# Patient Record
Sex: Female | Born: 1945
Health system: Southern US, Community
[De-identification: ages and names within clinical notes are randomized; demographics above are authoritative.]

## PROBLEM LIST (undated history)

## (undated) DIAGNOSIS — Z9189 Other specified personal risk factors, not elsewhere classified: Secondary | ICD-10-CM

## (undated) DIAGNOSIS — I10 Essential (primary) hypertension: Secondary | ICD-10-CM

## (undated) DIAGNOSIS — C50919 Malignant neoplasm of unspecified site of unspecified female breast: Secondary | ICD-10-CM

## (undated) DIAGNOSIS — E559 Vitamin D deficiency, unspecified: Secondary | ICD-10-CM

## (undated) DIAGNOSIS — C180 Malignant neoplasm of cecum: Secondary | ICD-10-CM

## (undated) DIAGNOSIS — R32 Unspecified urinary incontinence: Secondary | ICD-10-CM

## (undated) DIAGNOSIS — K219 Gastro-esophageal reflux disease without esophagitis: Secondary | ICD-10-CM

## (undated) DIAGNOSIS — M1712 Unilateral primary osteoarthritis, left knee: Secondary | ICD-10-CM

## (undated) DIAGNOSIS — D649 Anemia, unspecified: Secondary | ICD-10-CM

## (undated) HISTORY — DX: Malignant neoplasm of unspecified site of unspecified female breast: C50.919

## (undated) HISTORY — DX: Vitamin D deficiency, unspecified: E55.9

## (undated) HISTORY — DX: Other specified personal risk factors, not elsewhere classified: Z91.89

## (undated) HISTORY — DX: Unilateral primary osteoarthritis, left knee: M17.12

## (undated) HISTORY — PX: COLONOSCOPY: SHX174

## (undated) HISTORY — DX: Essential (primary) hypertension: I10

## (undated) HISTORY — PX: HEMORRHOID SURGERY: SHX153

## (undated) HISTORY — DX: Anemia, unspecified: D64.9

## (undated) HISTORY — DX: Malignant neoplasm of cecum: C18.0

## (undated) HISTORY — PX: POLYPECTOMY: SHX149

## (undated) HISTORY — DX: Gastro-esophageal reflux disease without esophagitis: K21.9

## (undated) HISTORY — DX: Unspecified urinary incontinence: R32

---

## 1974-06-22 HISTORY — PX: ABDOMINAL HYSTERECTOMY: SHX81

## 1979-02-21 HISTORY — PX: HEMORRHOID SURGERY: SHX153

## 2008-11-14 ENCOUNTER — Ambulatory Visit: Payer: Self-pay | Admitting: Internal Medicine

## 2008-11-14 DIAGNOSIS — Z9189 Other specified personal risk factors, not elsewhere classified: Secondary | ICD-10-CM | POA: Insufficient documentation

## 2008-11-14 DIAGNOSIS — I10 Essential (primary) hypertension: Secondary | ICD-10-CM | POA: Insufficient documentation

## 2008-11-14 DIAGNOSIS — K219 Gastro-esophageal reflux disease without esophagitis: Secondary | ICD-10-CM

## 2008-11-14 DIAGNOSIS — R32 Unspecified urinary incontinence: Secondary | ICD-10-CM

## 2008-11-14 DIAGNOSIS — Z87448 Personal history of other diseases of urinary system: Secondary | ICD-10-CM | POA: Insufficient documentation

## 2008-11-14 HISTORY — DX: Other specified personal risk factors, not elsewhere classified: Z91.89

## 2008-11-15 ENCOUNTER — Encounter (INDEPENDENT_AMBULATORY_CARE_PROVIDER_SITE_OTHER): Payer: Self-pay | Admitting: *Deleted

## 2008-11-15 LAB — CONVERTED CEMR LAB
Basophils Absolute: 0.1 10*3/uL (ref 0.0–0.1)
Eosinophils Absolute: 0.2 10*3/uL (ref 0.0–0.7)
HCT: 35.7 % — ABNORMAL LOW (ref 36.0–46.0)
Lymphs Abs: 1.4 10*3/uL (ref 0.7–4.0)
MCHC: 34 g/dL (ref 30.0–36.0)
MCV: 85.5 fL (ref 78.0–100.0)
Monocytes Absolute: 0.3 10*3/uL (ref 0.1–1.0)
Platelets: 393 10*3/uL (ref 150.0–400.0)
RDW: 14.1 % (ref 11.5–14.6)
TSH: 0.72 microintl units/mL (ref 0.35–5.50)

## 2008-11-29 ENCOUNTER — Ambulatory Visit: Payer: Self-pay | Admitting: Internal Medicine

## 2008-11-29 ENCOUNTER — Encounter (INDEPENDENT_AMBULATORY_CARE_PROVIDER_SITE_OTHER): Payer: Self-pay | Admitting: *Deleted

## 2008-11-29 DIAGNOSIS — E559 Vitamin D deficiency, unspecified: Secondary | ICD-10-CM | POA: Insufficient documentation

## 2008-11-29 LAB — CONVERTED CEMR LAB
AST: 18 units/L (ref 0–37)
Alkaline Phosphatase: 90 units/L (ref 39–117)
Basophils Absolute: 0.1 10*3/uL (ref 0.0–0.1)
Bilirubin Urine: NEGATIVE
CO2: 28 meq/L (ref 19–32)
Calcium: 8.8 mg/dL (ref 8.4–10.5)
Chloride: 107 meq/L (ref 96–112)
Creatinine, Ser: 0.7 mg/dL (ref 0.4–1.2)
Eosinophils Absolute: 0.2 10*3/uL (ref 0.0–0.7)
Glucose, Bld: 79 mg/dL (ref 70–99)
HDL: 63.2 mg/dL (ref >39.00)
LDL Cholesterol: 121 mg/dL — ABNORMAL HIGH (ref 0–99)
Leukocytes, UA: NEGATIVE
Lymphocytes Relative: 24.8 % (ref 12.0–46.0)
MCHC: 33.2 g/dL (ref 30.0–36.0)
Monocytes Relative: 5 % (ref 3.0–12.0)
Nitrite: NEGATIVE
Platelets: 402 10*3/uL — ABNORMAL HIGH (ref 150.0–400.0)
RDW: 14.6 % (ref 11.5–14.6)
Sodium: 141 meq/L (ref 135–145)
Total Bilirubin: 0.5 mg/dL (ref 0.3–1.2)
Total CHOL/HDL Ratio: 3
Triglycerides: 67 mg/dL (ref 0.0–149.0)
Urobilinogen, UA: 0.2 (ref 0.0–1.0)
pH: 6 (ref 5.0–8.0)

## 2008-12-12 ENCOUNTER — Ambulatory Visit: Payer: Self-pay | Admitting: Gastroenterology

## 2008-12-20 HISTORY — PX: COLON SURGERY: SHX602

## 2008-12-26 ENCOUNTER — Telehealth: Payer: Self-pay | Admitting: Gastroenterology

## 2008-12-26 ENCOUNTER — Encounter: Payer: Self-pay | Admitting: Gastroenterology

## 2008-12-26 ENCOUNTER — Ambulatory Visit: Payer: Self-pay | Admitting: Gastroenterology

## 2008-12-26 DIAGNOSIS — D490 Neoplasm of unspecified behavior of digestive system: Secondary | ICD-10-CM | POA: Insufficient documentation

## 2008-12-26 LAB — HM COLONOSCOPY

## 2008-12-27 ENCOUNTER — Ambulatory Visit: Payer: Self-pay | Admitting: Internal Medicine

## 2008-12-28 ENCOUNTER — Ambulatory Visit: Payer: Self-pay | Admitting: Cardiology

## 2009-01-04 ENCOUNTER — Encounter: Payer: Self-pay | Admitting: Gastroenterology

## 2009-01-13 ENCOUNTER — Encounter: Payer: Self-pay | Admitting: Internal Medicine

## 2009-01-14 ENCOUNTER — Telehealth: Payer: Self-pay | Admitting: Internal Medicine

## 2009-01-14 ENCOUNTER — Inpatient Hospital Stay (HOSPITAL_COMMUNITY): Admission: RE | Admit: 2009-01-14 | Discharge: 2009-01-18 | Payer: Self-pay | Admitting: General Surgery

## 2009-01-14 ENCOUNTER — Encounter (INDEPENDENT_AMBULATORY_CARE_PROVIDER_SITE_OTHER): Payer: Self-pay | Admitting: General Surgery

## 2009-02-07 ENCOUNTER — Encounter: Payer: Self-pay | Admitting: Internal Medicine

## 2009-02-08 ENCOUNTER — Ambulatory Visit: Payer: Self-pay | Admitting: Oncology

## 2009-02-14 ENCOUNTER — Encounter: Payer: Self-pay | Admitting: Internal Medicine

## 2009-02-14 ENCOUNTER — Telehealth: Payer: Self-pay | Admitting: Internal Medicine

## 2009-02-14 LAB — COMPREHENSIVE METABOLIC PANEL
Alkaline Phosphatase: 123 U/L — ABNORMAL HIGH (ref 39–117)
BUN: 5 mg/dL — ABNORMAL LOW (ref 6–23)
Creatinine, Ser: 0.55 mg/dL (ref 0.40–1.20)
Glucose, Bld: 86 mg/dL (ref 70–99)
Total Bilirubin: 0.5 mg/dL (ref 0.3–1.2)

## 2009-02-14 LAB — CBC WITH DIFFERENTIAL/PLATELET
Basophils Absolute: 0 10*3/uL (ref 0.0–0.1)
Eosinophils Absolute: 0.3 10*3/uL (ref 0.0–0.5)
HGB: 10.3 g/dL — ABNORMAL LOW (ref 11.6–15.9)
LYMPH%: 37.7 % (ref 14.0–49.7)
MCV: 85 fL (ref 79.5–101.0)
MONO%: 7.4 % (ref 0.0–14.0)
NEUT#: 2.3 10*3/uL (ref 1.5–6.5)
Platelets: 312 10*3/uL (ref 145–400)
RBC: 3.66 10*6/uL — ABNORMAL LOW (ref 3.70–5.45)

## 2009-02-14 LAB — CEA: CEA: 0.7 ng/mL (ref 0.0–5.0)

## 2009-03-21 ENCOUNTER — Encounter: Payer: Self-pay | Admitting: Internal Medicine

## 2009-03-28 ENCOUNTER — Ambulatory Visit: Payer: Self-pay | Admitting: Internal Medicine

## 2009-03-28 DIAGNOSIS — C180 Malignant neoplasm of cecum: Secondary | ICD-10-CM

## 2009-03-28 HISTORY — DX: Malignant neoplasm of cecum: C18.0

## 2009-03-28 LAB — CONVERTED CEMR LAB
Basophils Absolute: 0 10*3/uL (ref 0.0–0.1)
Calcium: 9.4 mg/dL (ref 8.4–10.5)
Eosinophils Absolute: 0.2 10*3/uL (ref 0.0–0.7)
GFR calc non Af Amer: 106.54 mL/min (ref >60)
Glucose, Bld: 85 mg/dL (ref 70–99)
HCT: 35.4 % — ABNORMAL LOW (ref 36.0–46.0)
Lymphs Abs: 1.4 10*3/uL (ref 0.7–4.0)
MCHC: 33.3 g/dL (ref 30.0–36.0)
MCV: 89.8 fL (ref 78.0–100.0)
Monocytes Absolute: 0.3 10*3/uL (ref 0.1–1.0)
Neutrophils Relative %: 64.8 % (ref 43.0–77.0)
Platelets: 265 10*3/uL (ref 150.0–400.0)
Potassium: 3.8 meq/L (ref 3.5–5.1)
RDW: 13.9 % (ref 11.5–14.6)
Sodium: 145 meq/L (ref 135–145)
WBC: 5.6 10*3/uL (ref 4.5–10.5)

## 2009-05-07 ENCOUNTER — Ambulatory Visit: Payer: Self-pay | Admitting: Oncology

## 2009-05-09 ENCOUNTER — Encounter: Payer: Self-pay | Admitting: Internal Medicine

## 2009-05-09 LAB — CBC WITH DIFFERENTIAL/PLATELET
Basophils Absolute: 0 10*3/uL (ref 0.0–0.1)
EOS%: 4 % (ref 0.0–7.0)
HCT: 35.1 % (ref 34.8–46.6)
HGB: 11.6 g/dL (ref 11.6–15.9)
MCH: 29.2 pg (ref 25.1–34.0)
MCV: 88.2 fL (ref 79.5–101.0)
MONO%: 5.6 % (ref 0.0–14.0)
NEUT%: 63.8 % (ref 38.4–76.8)
Platelets: 266 10*3/uL (ref 145–400)

## 2009-05-09 LAB — CEA: CEA: <0.5 ng/mL (ref 0.0–5.0)

## 2009-05-09 LAB — COMPREHENSIVE METABOLIC PANEL
AST: 24 U/L (ref 0–37)
Alkaline Phosphatase: 99 U/L (ref 39–117)
BUN: 8 mg/dL (ref 6–23)
Calcium: 9.5 mg/dL (ref 8.4–10.5)
Creatinine, Ser: 0.65 mg/dL (ref 0.40–1.20)

## 2009-06-27 ENCOUNTER — Telehealth: Payer: Self-pay | Admitting: Internal Medicine

## 2009-06-27 ENCOUNTER — Encounter: Payer: Self-pay | Admitting: Internal Medicine

## 2009-07-01 ENCOUNTER — Ambulatory Visit: Payer: Self-pay | Admitting: Internal Medicine

## 2009-07-01 LAB — CONVERTED CEMR LAB: BUN: 7 mg/dL (ref 6–23)

## 2009-07-05 ENCOUNTER — Ambulatory Visit: Payer: Self-pay | Admitting: Internal Medicine

## 2009-07-11 ENCOUNTER — Ambulatory Visit: Payer: Self-pay | Admitting: Internal Medicine

## 2009-08-07 ENCOUNTER — Ambulatory Visit: Payer: Self-pay | Admitting: Oncology

## 2009-08-09 ENCOUNTER — Encounter: Payer: Self-pay | Admitting: Gastroenterology

## 2009-08-09 LAB — COMPREHENSIVE METABOLIC PANEL
AST: 20 U/L (ref 0–37)
Albumin: 4.5 g/dL (ref 3.5–5.2)
BUN: 11 mg/dL (ref 6–23)
CO2: 26 mEq/L (ref 19–32)
Calcium: 9.3 mg/dL (ref 8.4–10.5)
Chloride: 101 mEq/L (ref 96–112)
Glucose, Bld: 76 mg/dL (ref 70–99)
Potassium: 4.6 mEq/L (ref 3.5–5.3)

## 2009-08-09 LAB — CEA: CEA: 0.9 ng/mL (ref 0.0–5.0)

## 2009-08-09 LAB — CBC WITH DIFFERENTIAL/PLATELET
BASO%: 0.4 % (ref 0.0–2.0)
Basophils Absolute: 0 10*3/uL (ref 0.0–0.1)
Eosinophils Absolute: 0.1 10*3/uL (ref 0.0–0.5)
HCT: 39.4 % (ref 34.8–46.6)
HGB: 13.4 g/dL (ref 11.6–15.9)
MONO#: 0.4 10*3/uL (ref 0.1–0.9)
NEUT%: 63.2 % (ref 38.4–76.8)
Platelets: 286 10*3/uL (ref 145–400)
WBC: 5.5 10*3/uL (ref 3.9–10.3)
lymph#: 1.5 10*3/uL (ref 0.9–3.3)

## 2009-09-19 ENCOUNTER — Encounter: Payer: Self-pay | Admitting: Internal Medicine

## 2009-11-06 ENCOUNTER — Ambulatory Visit (HOSPITAL_COMMUNITY): Admission: RE | Admit: 2009-11-06 | Discharge: 2009-11-06 | Payer: Self-pay | Admitting: Oncology

## 2009-11-07 ENCOUNTER — Ambulatory Visit: Payer: Self-pay | Admitting: Oncology

## 2009-11-08 ENCOUNTER — Encounter: Payer: Self-pay | Admitting: Gastroenterology

## 2009-11-08 LAB — CBC WITH DIFFERENTIAL/PLATELET
BASO%: 0.4 % (ref 0.0–2.0)
Basophils Absolute: 0 10*3/uL (ref 0.0–0.1)
EOS%: 4.9 % (ref 0.0–7.0)
HGB: 11.9 g/dL (ref 11.6–15.9)
MCH: 29.9 pg (ref 25.1–34.0)
MCHC: 34.6 g/dL (ref 31.5–36.0)
MCV: 86.4 fL (ref 79.5–101.0)
MONO%: 9.1 % (ref 0.0–14.0)
NEUT%: 53.8 % (ref 38.4–76.8)
RDW: 13.7 % (ref 11.2–14.5)
lymph#: 1.6 10*3/uL (ref 0.9–3.3)

## 2009-11-08 LAB — COMPREHENSIVE METABOLIC PANEL
Alkaline Phosphatase: 88 U/L (ref 39–117)
CO2: 22 mEq/L (ref 19–32)
Creatinine, Ser: 0.62 mg/dL (ref 0.40–1.20)
Glucose, Bld: 82 mg/dL (ref 70–99)
Total Bilirubin: 0.5 mg/dL (ref 0.3–1.2)

## 2009-11-08 LAB — CEA: CEA: 0.7 ng/mL (ref 0.0–5.0)

## 2009-11-08 LAB — LACTATE DEHYDROGENASE: LDH: 178 U/L (ref 94–250)

## 2010-03-10 ENCOUNTER — Ambulatory Visit: Payer: Self-pay | Admitting: Internal Medicine

## 2010-03-11 ENCOUNTER — Telehealth: Payer: Self-pay | Admitting: Internal Medicine

## 2010-03-14 ENCOUNTER — Encounter: Payer: Self-pay | Admitting: Internal Medicine

## 2010-03-14 LAB — HM MAMMOGRAPHY

## 2010-03-24 ENCOUNTER — Ambulatory Visit: Payer: Self-pay | Admitting: Internal Medicine

## 2010-04-09 ENCOUNTER — Emergency Department (HOSPITAL_COMMUNITY): Admission: EM | Admit: 2010-04-09 | Discharge: 2010-04-09 | Payer: Self-pay | Admitting: Emergency Medicine

## 2010-06-20 ENCOUNTER — Telehealth: Payer: Self-pay | Admitting: Internal Medicine

## 2010-06-26 ENCOUNTER — Ambulatory Visit
Admission: RE | Admit: 2010-06-26 | Discharge: 2010-06-26 | Payer: Self-pay | Source: Home / Self Care | Attending: Internal Medicine | Admitting: Internal Medicine

## 2010-07-10 ENCOUNTER — Ambulatory Visit
Admission: RE | Admit: 2010-07-10 | Discharge: 2010-07-10 | Payer: Self-pay | Source: Home / Self Care | Attending: Internal Medicine | Admitting: Internal Medicine

## 2010-07-22 NOTE — Assessment & Plan Note (Signed)
Summary: 2-4 WK FU--STC   Vital Signs:  Patient profile:   65 year old female Height:      61 inches (154.94 cm) Weight:      168.8 pounds (76.73 kg) O2 Sat:      98 % on Room air Temp:     97.3 degrees F (36.28 degrees C) oral Pulse rate:   79 / minute BP sitting:   134 / 82  (left arm) Cuff size:   large  Vitals Entered By: Orlan Leavens RMA (March 24, 2010 8:49 AM)  O2 Flow:  Room air CC: 2 week follow-up Is Patient Diabetic? No Pain Assessment Patient in pain? no        Primary Care Provider:  Newt Lukes MD  CC:  2 week follow-up.  History of Present Illness: here for BP f/u HTN hx - home BP log reviewed changed benicar to generic 02/2010 for cost no CP, HA or symptoms of dizziness   review other chronic med issues: colon cancer hx - s/p resection July 2010 onc ([facility]) rec no adjuvent tx - rec colo 1 year f/u CT 06/2009 - no evidence of recurrent dz pt feeling well post op and gaining weight again (up 10# since last visit)  postop anemia - mild with surg loss no anemia prior to colo dx of cancer - not taking MVI or iron/b12 supp  loss of weight last summer -  as above, trend now reversed good appetite in fact, up 29# since lowest   Current Medications (verified): 1)  Losartan Potassium 50 Mg Tabs (Losartan Potassium) .Marland Kitchen.. 1 By Mouth Once Daily 2)  Vitamin D3 1000 Unit Caps (Cholecalciferol) .Marland Kitchen.. 1 Cap By Mouth Once Daily  Allergies (verified): 1)  ! Penicillin  Past History:  Past Medical History: GERD Hypertension Urinary incontinence  Blood transfusion hx adenocarc of cecum s/p resection 12/2008, no chemo/adj tx per onc  physician roster - GI- [facility] onc - [facility] - [facility]  Review of Systems  The patient denies chest pain, syncope, peripheral edema, and headaches.    Physical Exam  General:  alert, well-developed, well-nourished, and cooperative to examination.    Lungs:  normal respiratory effort, no intercostal  retractions or use of accessory muscles; normal breath sounds bilaterally - no crackles and no wheezes.    Heart:  normal rate, regular rhythm, no murmur, and no rub. BLE with trace ankle edema.    Impression & Recommendations:  Problem # 1:  HYPERTENSION (ICD-401.9)  Her updated medication list for this problem includes:    Losartan Potassium 50 Mg Tabs (Losartan potassium) .Marland Kitchen... 1 by mouth once daily  changed benicar to generic ARB  03/10/10- tolerable control - will follow w/o change for now  BP today: 134/82 Prior BP: 132/82 (03/10/2010)  Labs Reviewed: K+: 3.8 (03/28/2009) Creat: : 0.7 (07/01/2009)   Chol: 198 (11/29/2008)   HDL: 63.20 (11/29/2008)   LDL: 121 (11/29/2008)   TG: 67.0 (11/29/2008)  Complete Medication List: 1)  Losartan Potassium 50 Mg Tabs (Losartan potassium) .Marland Kitchen.. 1 by mouth once daily 2)  Vitamin D3 1000 Unit Caps (Cholecalciferol) .Marland Kitchen.. 1 cap by mouth once daily  Patient Instructions: 1)  it was good to see you today.  2)  continue the generic losartan for now 3)  Check your Blood Pressure regularly. If it is above: 135/85 you should  call us and make an appointment to adjust medication 4)  Limit your Sodium (Salt). 5)  Please schedule a follow-up  appointment in 3 months to monitor blood pressure and weight, sooner if problems.  6)  keep plans to followup with dr. Arlyce Dice [phone]) and dr. Tenny Craw (gynecology) as discussed

## 2010-07-22 NOTE — Letter (Signed)
[facility]   Imported By: Lester [place] 12/04/2009 09:45:21  _____________________________________________________________________  External Attachment:    Type:   Image     Comment:   External Document

## 2010-07-22 NOTE — Assessment & Plan Note (Signed)
Summary: 3 MTH FU STC   Vital Signs:  Patient profile:   65 year old female Height:      61 inches (154.94 cm) Weight:      152.0 pounds (69.09 kg) BMI:     28.82 O2 Sat:      99 % on Room air Temp:     98.2 degrees F (36.78 degrees C) oral Pulse rate:   77 / minute BP sitting:   111 / 72  (left arm) Cuff size:   regular  Vitals Entered By: Orlan Leavens (July 11, 2009 8:32 AM)  O2 Flow:  Room air CC: 3 month follow-up Is Patient Diabetic? No Pain Assessment Patient in pain? no        Primary Care Provider:  Newt Lukes MD  CC:  3 month follow-up.  History of Present Illness: colon cancer hx - s/p recestion in last week July 2010 onc ([facility]) rec no adjuvent tx - rec colo 1 year f/o CT ordered last week - no evidence of recurrent dz pt feeling well post op and gaining weight again (up 10# since last visit)  HTN hx - home BP log reviewed no adv SE on diovan no CP, HA or symptoms of dizziness  postop anemia - mild with surg loss no anemia prior to colo dx of cancer - not taking MVI or iron/b12 supp  loss of weight last summer -  as above, trend now reversed good appetite in fact, 12# weight gain in last 3 mos!!!   Current Medications (verified): 1)  Benicar 20 Mg Tabs (Olmesartan Medoxomil) .Marland Kitchen.. 1 By Mouth Once Daily 2)  Vitamin D3 1000 Unit Caps (Cholecalciferol) .Marland Kitchen.. 1 Cap By Mouth Once Daily  Allergies (verified): 1)  ! Penicillin  Past History:  Past Medical History: Last updated: 03/28/2009 GERD Hypertension Urinary incontinence Blood transfusion adenocarc of cecum s/p resection 12/2008, no chemo/adj tx per onc  physician rooster - GI- [facility] - [facility] - [facility]  Review of Systems       The patient complains of weight gain.  The patient denies fever, chest pain, syncope, and abdominal pain.    Physical Exam  General:  alert, well-developed, well-nourished, and cooperative to examination.    Lungs:  normal  respiratory effort, no intercostal retractions or use of accessory muscles; normal breath sounds bilaterally - no crackles and no wheezes.    Heart:  normal rate, regular rhythm, no murmur, and no rub. BLE without edema.    Impression & Recommendations:  Problem # 1:  HYPERTENSION (ICD-401.9)  Her updated medication list for this problem includes:    Benicar 20 Mg Tabs (Olmesartan medoxomil) .Marland Kitchen... 1 by mouth once daily  BP today: 111/72 Prior BP: 124/68 (03/28/2009)  Labs Reviewed: K+: 3.8 (03/28/2009) Creat: : 0.7 (07/01/2009)   Chol: 198 (11/29/2008)   HDL: 63.20 (11/29/2008)   LDL: 121 (11/29/2008)   TG: 67.0 (11/29/2008)  Problem # 2:  MALIGNANT NEOPLASM OF CECUM (ICD-153.4)  s/p resection with clean margins and negative LNs - repeat CT AP done 07/05/08 - no change in minute liver lesion - results again reviewed with pt cont follow onc and plan repeat colo this fall  Time spent with patient 25 minutes, more than 50% of this time was spent counseling patient on HTN medications, cancewr surviellence, CT results and need to watch weight gain   Problem # 3:  GERD (ICD-530.81) no active symptoms   Problem # 4:  VITAMIN D DEFICIENCY (ICD-268.9)  cont daily supp  Complete Medication List: 1)  Benicar 20 Mg Tabs (Olmesartan medoxomil) .Marland Kitchen.. 1 by mouth once daily 2)  Vitamin D3 1000 Unit Caps (Cholecalciferol) .Marland Kitchen.. 1 cap by mouth once daily  Patient Instructions: 1)  it was good to see you today.  2)  no change to your medications today 3)  it is important that you watch you weight - monitor your diet and consume fewer calories such as less carbohydrates (sugar) and less fat. you also need to increase your physical activity level - start by walking for 10-20 minutes 3 times per week and work up to 30 minutes 4-5 times each week.  goal weight 140-150 4)  Please schedule a follow-up appointment in 4-6 months, sooner if problems.

## 2010-07-22 NOTE — Progress Notes (Signed)
Summary: benicar  Phone Note Refill Request Message from:  Fax from Pharmacy on March 11, 2010 10:52 AM  Refills Requested: Medication #1:  Benicar 20mg  take 1 po qd # 90 [facility]   Method Requested: Fax to [facility] Initial call taken by: Orlan Leavens RMA,  March 11, 2010 10:52 AM  Follow-up for Phone Call        Faxed back paper request denied as of 03/10/10 med was change to Losartan 50mg  Follow-up by: Orlan Leavens RMA,  March 11, 2010 10:52 AM

## 2010-07-22 NOTE — Progress Notes (Signed)
Summary: labs  ---- Converted from flag ---- ---- 06/27/2009 10:41 AM, Newt Lukes MD wrote:   ---- 06/27/2009 9:33 AM, Shelbie Proctor wrote: Pt need a  BUN and Creatine pt has an appt for her CT scan on 07-01-2009@9 :30 Rose need orders could not use oct labs .... ------------------------------       Additional Follow-up for Phone Call Additional follow up Details #2::    Notified pt need labs prior to CT. Pt states could'nt do 07/01/09 for scan. Gave # to CT dept so she can reschedule. Entered labs in IDX Follow-up by: Orlan Leavens,  June 27, 2009 11:24 AM   Appended Document: labs pt re-scheduled Ct for 07/03/2009 @ 9:30am

## 2010-07-22 NOTE — Letter (Signed)
[facility]   Imported By: Sherian Rein 08/23/2009 11:32:29  _____________________________________________________________________  External Attachment:    Type:   Image     Comment:   External Document

## 2010-07-22 NOTE — Progress Notes (Signed)
Summary: need lab or[facility]  Phone Note Other Incoming   Caller: rose ct -[phone] Summary of Call: Pt need a  BUN and Creatine pt has an appt for her CT scan on 07-01-2009@9 :30 Rose need orders could not use oct labs .... Initial call taken by: Shelbie Proctor,  June 27, 2009 9:35 AM  Follow-up for Phone Call        Labs are entered in IDX. Pt is aware Follow-up by: Orlan Leavens,  June 27, 2009 11:26 AM

## 2010-07-22 NOTE — Assessment & Plan Note (Signed)
Summary: FU [place]  #   Vital Signs:  Patient profile:   65 year old female Height:      61 inches Weight:      168.8 pounds O2 Sat:      99 % on Room air Temp:     97.6 degrees F oral Pulse rate:   85 / minute BP sitting:   132 / 82  (left arm) Cuff size:   large  Vitals Entered By: Orlan Leavens RMA (March 10, 2010 8:53 AM)  O2 Flow:  Room air CC: follow-up visit Is Patient Diabetic? No Pain Assessment Patient in pain? no        Primary Care Provider:  Newt Lukes MD  CC:  follow-up visit.  History of Present Illness: colon cancer hx - s/p resection July 2010 onc ([facility]) rec no adjuvent tx - rec colo 1 year f/u CT 06/2009 - no evidence of recurrent dz pt feeling well post op and gaining weight again (up 10# since last visit)  HTN hx - home BP log reviewed no adv SE on benicar - would like to try generic no CP, HA or symptoms of dizziness  postop anemia - mild with surg loss no anemia prior to colo dx of cancer - not taking MVI or iron/b12 supp  loss of weight last summer -  as above, trend now reversed good appetite in fact, up 29# since lowest   Clinical Review Panels:  Immunizations   Last Tetanus Booster:  Td (06/22/2001)   Last Flu Vaccine:  Fluvax 3+ (03/10/2010)  Lipid Management   Cholesterol:  198 (11/29/2008)   LDL (bad choesterol):  121 (11/29/2008)   HDL (good cholesterol):  63.20 (11/29/2008)  CBC   WBC:  5.6 (03/28/2009)   RBC:  3.94 (03/28/2009)   Hgb:  11.8 (03/28/2009)   Hct:  35.4 (03/28/2009)   Platelets:  265.0 (03/28/2009)   MCV  89.8 (03/28/2009)   MCHC  33.3 (03/28/2009)   RDW  13.9 (03/28/2009)   PMN:  64.8 (03/28/2009)   Lymphs:  25.1 (03/28/2009)   Monos:  5.7 (03/28/2009)   Eosinophils:  3.6 (03/28/2009)   Basophil:  0.8 (03/28/2009)  Complete Metabolic Panel   Glucose:  85 (03/28/2009)   Sodium:  145 (03/28/2009)   Potassium:  3.8 (03/28/2009)   Chloride:  105 (03/28/2009)   CO2:  31 (03/28/2009)  BUN:  7 (07/01/2009)   Creatinine:  0.7 (07/01/2009)   Albumin:  3.2 (11/29/2008)   Total Protein:  6.9 (11/29/2008)   Calcium:  9.4 (03/28/2009)   Total Bili:  0.5 (11/29/2008)   Alk Phos:  90 (11/29/2008)   SGPT (ALT):  12 (11/29/2008)   SGOT (AST):  18 (11/29/2008)   Current Medications (verified): 1)  Benicar 20 Mg Tabs (Olmesartan Medoxomil) .Marland Kitchen.. 1 By Mouth Once Daily 2)  Vitamin D3 1000 Unit Caps (Cholecalciferol) .Marland Kitchen.. 1 Cap By Mouth Once Daily  Allergies (verified): 1)  ! Penicillin  Past History:  Past Medical History: GERD Hypertension Urinary incontinence Blood transfusion adenocarc of cecum s/p resection 12/2008, no chemo/adj tx per onc  physician roster - GI- [facility] onc - [facility] - [facility]  Review of Systems  The patient denies chest pain, syncope, dyspnea on exertion, peripheral edema, and headaches.    Physical Exam  General:  alert, well-developed, well-nourished, and cooperative to examination.    Lungs:  normal respiratory effort, no intercostal retractions or use of accessory muscles; normal breath sounds bilaterally - no crackles and  no wheezes.    Heart:  normal rate, regular rhythm, no murmur, and no rub. BLE with trace ankle edema.    Impression & Recommendations:  Problem # 1:  HYPERTENSION (ICD-401.9)  change benicar to generic ARB - recheck 2-4 weeks to ensure good control pt agrees Her updated medication list for this problem includes:    Losartan Potassium 50 Mg Tabs (Losartan potassium) .Marland Kitchen... 1 by mouth once daily  BP today: 132/82 Prior BP: 111/72 (07/11/2009)  Labs Reviewed: K+: 3.8 (03/28/2009) Creat: : 0.7 (07/01/2009)   Chol: 198 (11/29/2008)   HDL: 63.20 (11/29/2008)   LDL: 121 (11/29/2008)   TG: 67.0 (11/29/2008)  Orders: Prescription Created Electronically [phone])  Problem # 2:  MALIGNANT NEOPLASM OF CECUM (ICD-153.4)  s/p resection with clean margins and negative LNs 12/2008 - repeat CT AP done 07/05/08 - no  change in minute liver lesion - cont follow onc and due for repeat colo this fall  Orders: Prescription Created Electronically [phone])  Complete Medication List: 1)  Losartan Potassium 50 Mg Tabs (Losartan potassium) .Marland Kitchen.. 1 by mouth once daily 2)  Vitamin D3 1000 Unit Caps (Cholecalciferol) .Marland Kitchen.. 1 cap by mouth once daily  Other Orders: Flu Vaccine 38yrs + (09811) Administration Flu vaccine - MCR (B1478)  Patient Instructions: 1)  it was good to see you today.  2)  change Benicar to generic losartan today - your prescription has been electronically submitted to your local pharmacy. Please take as directed. Contact our office if you believe you're having problems with the medication(s).  3)  it is important that you watch you weight - monitor your diet and consume fewer calories such as less carbohydrates (sugar) and less fat. you also need to increase your physical activity level - start by walking for 10-20 minutes 3 times per week and work up to 30 minutes 4-5 times each week.  goal weight 140-150 4)  Please schedule a follow-up appointment in 2-4 weeks to recheck your blod pressure and change medication if needed, call sooner if problems.  5)  schedule your colonoscopy, mammogram and gynecology visits as discussed - if problems, we can help you arrange this at next visit Flu Vaccine Consent Questions     Do you have a history of severe allergic reactions to this vaccine? no    Any prior history of allergic reactions to egg and/or gelatin? no    Do you have a sensitivity to the preservative Thimersol? no    Do you have a past history of Guillan-Barre Syndrome? no    Do you currently have an acute febrile illness? no    Have you ever had a severe reaction to latex? no    Vaccine information given and explained to patient? yes    Are you currently pregnant? no    Lot Number:[id number]   Exp Date:12/19/2009   Site Given  Left Deltoid IMPrescriptions: LOSARTAN POTASSIUM 50 MG TABS  (LOSARTAN POTASSIUM) 1 by mouth once daily  #30 x 1   Entered and Authorized by:   Newt Lukes MD   Signed by:   Newt Lukes MD on 03/10/2010   Method used:   Electronically to        [facility]  [address] # [id number]* (retail)       [address]       [place], [place]  [address]       Ph: [phone] or [phone]  Fax: [phone]   RxID:   [id number]    .[email or web address]

## 2010-07-22 NOTE — Letter (Signed)
Summary: [facility]   Imported By: Lester [place] 10/17/2009 10:37:55  _____________________________________________________________________  External Attachment:    Type:   Image     Comment:   External Document

## 2010-07-22 NOTE — Miscellaneous (Signed)
Summary: schedule f/u CT A/P - liver lesion, colon ca hx   Clinical Lists Changes  Problems: Added new problem of NONSPECIFIC ABN FINDING RAD & OTH EXAM GI TRACT (ICD-793.4) - Signed Orders: Added new Referral order of Misc. Referral (Misc. Ref) - Signed  please let pt know she is due for her followup CT -  will plan to review results at 1/20 OV order placed for [facility] to schedule CT - thanks Newt Lukes MD  June 27, 2009 8:39 AM     Notified pt with md recommendations......1/6/11211:22am/LMB

## 2010-07-24 NOTE — Progress Notes (Signed)
Summary: Med change  Phone Note Call from Patient Call back at Home Phone [phone]   Caller: Patient Summary of Call: Pt called requesting to have Losartan changed back to Benicar due to SE with Losartan. Pt says she requested to change due to cost but SE-fatigue and cough are to moderate. [phone] Initial call taken by: Margaret Pyle, CMA,  June 20, 2010 11:47 AM  Follow-up for Phone Call        changed losart back to benicar 20mg  - erx done to [facility] as requested Follow-up by: Newt Lukes MD,  June 20, 2010 12:29 PM  Additional Follow-up for Phone Call Additional follow up Details #1::        called pt. informed prescription requested has been sent to her pharmacy. Additional Follow-up by: Robin Ewing CMA (AAMA),  June 20, 2010 2:10 PM    New/Updated Medications: BENICAR 20 MG TABS (OLMESARTAN MEDOXOMIL) 1 by mouth once daily Prescriptions: BENICAR 20 MG TABS (OLMESARTAN MEDOXOMIL) 1 by mouth once daily  #30 x 3   Entered and Authorized by:   Newt Lukes MD   Signed by:   Newt Lukes MD on 06/20/2010   Method used:   [facility] to        [phone]* (retail)       [address] [place], [place]  [address]       Ph: [phone]       Fax: [phone]   RxID:   [id number]

## 2010-07-24 NOTE — Assessment & Plan Note (Signed)
Summary: 2-3 WK BPC--VL---STC  Nurse Visit   Vital Signs:  Patient profile:   65 year old female BP sitting:   132 / 82  (left arm) Cuff size:   regular  Vitals Entered By: Orlan Leavens RMA (July 10, 2010 10:08 AM) CC: BP check   Allergies: 1)  ! Penicillin  Orders Added: 1)  Est. Patient Level I [04540]

## 2010-07-24 NOTE — Assessment & Plan Note (Signed)
Summary: 3 MO ROV /NWS   Vital Signs:  Patient profile:   65 year old female Height:      61 inches (154.94 cm) Weight:      170.2 pounds (77.36 kg) BMI:     32.28 O2 Sat:      99 % on Room air Temp:     98.2 degrees F (36.78 degrees C) oral Pulse rate:   90 / minute BP sitting:   144 / 82  (left arm) Cuff size:   large  Vitals Entered By: Orlan Leavens RMA (June 26, 2010 8:55 AM)  O2 Flow:  Room air CC: 3 month follow-up Is Patient Diabetic? No Pain Assessment Patient in pain? no        Primary Care Provider:  Newt Lukes MD  CC:  3 month follow-up.  History of Present Illness: here for f/u  HTN  - home BP log reviewed changed benicar to generic 02/2010 for cost, now back to benicar but only in past 2 days prev well controlled on current dose no CP, HA or symptoms of dizziness  colon cancer hx - s/p resection July 2010 onc ([facility]) rec no adjuvent tx - rec colo 1 year f/u CT 06/2009 - no evidence of recurrent dz pt feeling well post op and gaining weight again (up 10# since last visit)  postop anemia - mild with surg loss no anemia prior to colo dx of cancer - not taking MVI or iron/b12 supp  loss of weight summer 2010 -  as above, trend now reversed good appetite in fact, up 29# since lowest   Clinical Review Panels:  Lipid Management   Cholesterol:  198 (11/29/2008)   LDL (bad choesterol):  121 (11/29/2008)   HDL (good cholesterol):  63.20 (11/29/2008)  CBC   WBC:  5.6 (03/28/2009)   RBC:  3.94 (03/28/2009)   Hgb:  11.8 (03/28/2009)   Hct:  35.4 (03/28/2009)   Platelets:  265.0 (03/28/2009)   MCV  89.8 (03/28/2009)   MCHC  33.3 (03/28/2009)   RDW  13.9 (03/28/2009)   PMN:  64.8 (03/28/2009)   Lymphs:  25.1 (03/28/2009)   Monos:  5.7 (03/28/2009)   Eosinophils:  3.6 (03/28/2009)   Basophil:  0.8 (03/28/2009)  Complete Metabolic Panel   Glucose:  85 (03/28/2009)   Sodium:  145 (03/28/2009)   Potassium:  3.8 (03/28/2009)  Chloride:  105 (03/28/2009)   CO2:  31 (03/28/2009)   BUN:  7 (07/01/2009)   Creatinine:  0.7 (07/01/2009)   Albumin:  3.2 (11/29/2008)   Total Protein:  6.9 (11/29/2008)   Calcium:  9.4 (03/28/2009)   Total Bili:  0.5 (11/29/2008)   Alk Phos:  90 (11/29/2008)   SGPT (ALT):  12 (11/29/2008)   SGOT (AST):  18 (11/29/2008)   Current Medications (verified): 1)  Benicar 20 Mg Tabs (Olmesartan Medoxomil) .Marland Kitchen.. 1 By Mouth Once Daily 2)  Vitamin D3 1000 Unit Caps (Cholecalciferol) .Marland Kitchen.. 1 Cap By Mouth Once Daily 3)  Tramadol Hcl 50 Mg Tabs (Tramadol Hcl) .... Take 1-2 By Mouth Every 6 Hours As Needed  Allergies (verified): 1)  ! Penicillin  Past History:  Past Medical History: GERD Hypertension Urinary incontinence  Blood transfusion hx  adenocarc of cecum s/p resection 12/2008, no chemo/adj tx per onc  physician roster - GI- [facility] onc - [facility] - [facility]  Social History: Never Smoked  retired from [employer] - but looking to work Jan '11 kind[employer] parttime in the  fall '10 lives with dtr at her home no etoh  Review of Systems  The patient denies chest pain, syncope, and dyspnea on exertion.    Physical Exam  General:  alert, well-developed, well-nourished, and cooperative to examination.    Lungs:  normal respiratory effort, no intercostal retractions or use of accessory muscles; normal breath sounds bilaterally - no crackles and no wheezes.    Heart:  normal rate, regular rhythm, no murmur, and no rub. BLE with trace ankle edema.    Impression & Recommendations:  Problem # 1:  HYPERTENSION (ICD-401.9)  tried change to generic losartan 02/2010, now back on benicar 06/2009 due to dizziness add hctz once daily - recehck nurse visit in next 2-3 weeks to ensure adequate control Her updated medication list for this problem includes:    Benicar 20 Mg Tabs (Olmesartan medoxomil) .Marland Kitchen... 1 by mouth once daily    Hydrochlorothiazide 25 Mg Tabs  (Hydrochlorothiazide) .Marland Kitchen... 1 by mouth once daily  BP today: 144/82 Prior BP: 134/82 (03/24/2010)  Labs Reviewed: K+: 3.8 (03/28/2009) Creat: : 0.7 (07/01/2009)   Chol: 198 (11/29/2008)   HDL: 63.20 (11/29/2008)   LDL: 121 (11/29/2008)   TG: 67.0 (11/29/2008)  Orders: Prescription Created Electronically [phone])  Complete Medication List: 1)  Benicar 20 Mg Tabs (Olmesartan medoxomil) .Marland Kitchen.. 1 by mouth once daily 2)  Vitamin D3 1000 Unit Caps (Cholecalciferol) .Marland Kitchen.. 1 cap by mouth once daily 3)  Tramadol Hcl 50 Mg Tabs (Tramadol hcl) .... Take 1-2 by mouth every 6 hours as needed 4)  Hydrochlorothiazide 25 Mg Tabs (Hydrochlorothiazide) .Marland Kitchen.. 1 by mouth once daily  Patient Instructions: 1)  it was good to see you today. 2)  add diuretic to Benicar for swelling and blood pressure - your prescription has been electronically submitted to your pharmacy. Please take as directed. Contact our office if you believe you're having problems with the medication(s).  3)  Please schedule nurse visit in 2-3 weeks for blood pressure check and follow-up appointment with me in 3-4 months; call sooner if problems.  Prescriptions: HYDROCHLOROTHIAZIDE 25 MG TABS (HYDROCHLOROTHIAZIDE) 1 by mouth once daily  #30 x 3   Entered and Authorized by:   Newt Lukes MD   Signed by:   Newt Lukes MD on 06/26/2010   Method used:   Electron[facility] to        [facility] [phone]* (retail)       [address] [place], [place]  [address]       Ph: [phone]       Fax: [phone]   RxID:   [id number]    Orders Added: 1)  Est. Patient Level IV [02725] 2)  Prescription Created Electronically [phone]

## 2010-08-26 ENCOUNTER — Telehealth: Payer: Self-pay | Admitting: Internal Medicine

## 2010-08-26 IMAGING — CR DG CHEST 2V
2 series · 2 of 2 positions shown · non-contrast
Comparison: None

CLINICAL DATA: Preop, cecal mass

CHEST - 2 VIEW

[view not recorded (1 of 2)]
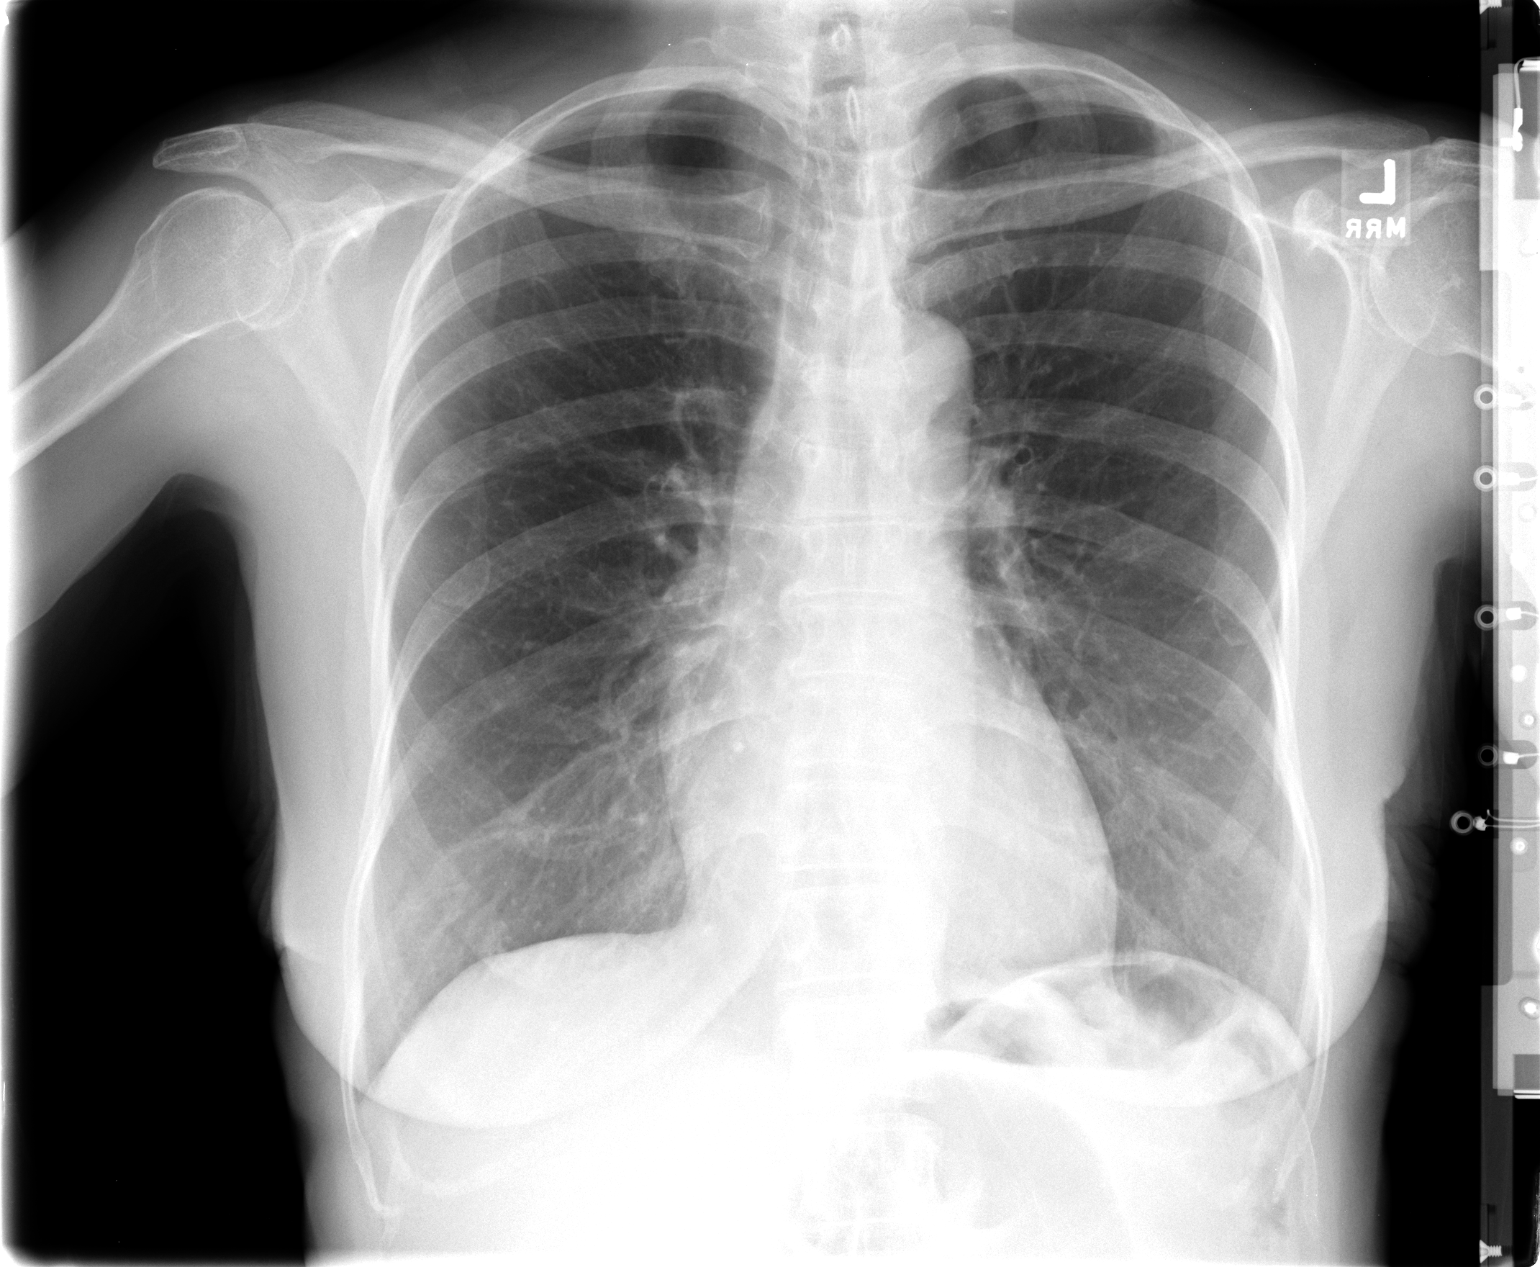

[view not recorded (2 of 2)]
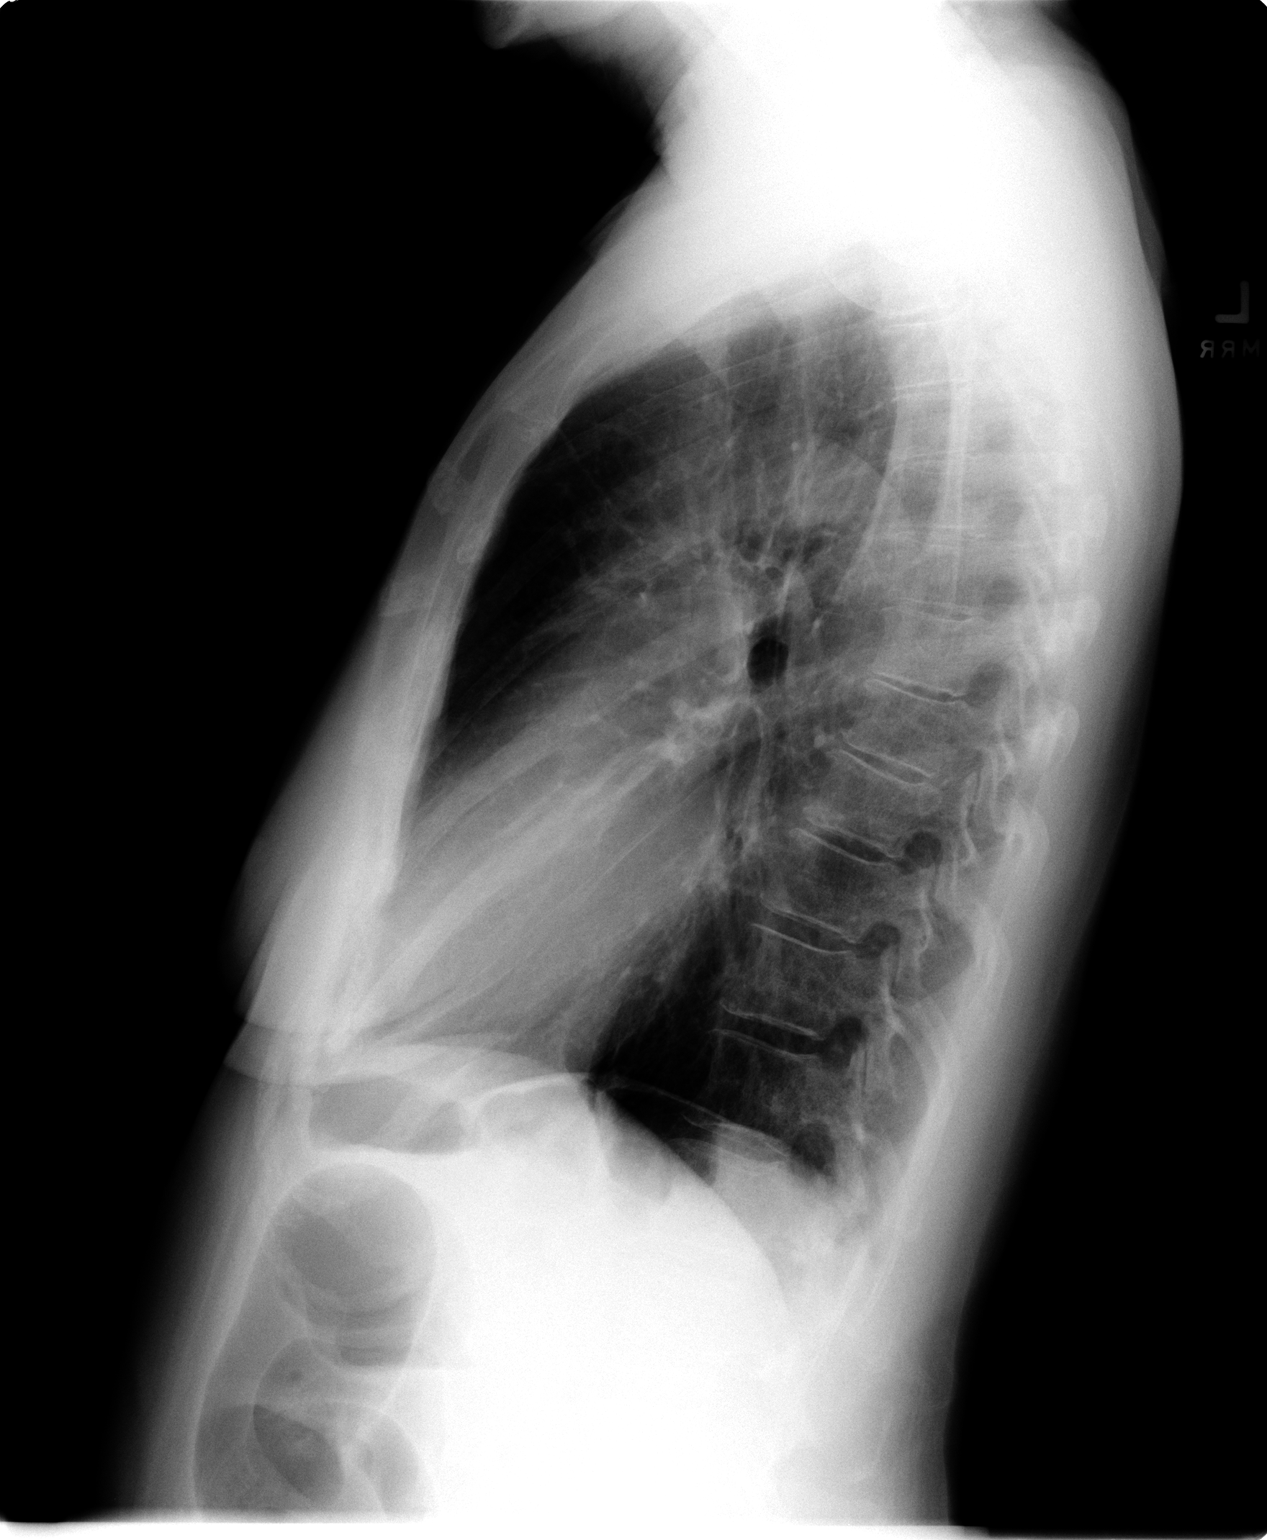

[2 of 2 positions shown; findings below may reference images not displayed]

FINDINGS: Cardiomediastinal silhouette is unremarkable.  No acute
infiltrate or pleural effusion.  No pulmonary edema.  Bony thorax
is unremarkable.
IMPRESSION: No active disease.

## 2010-09-02 NOTE — Progress Notes (Signed)
Summary: 90 day benicar & HCTZ  Phone Note From Pharmacy   Caller: [facility] Summary of Call: Pt is requesting to get 90 day supply on her HCTZ & Benicar. Initial call taken by: Orlan Leavens RMA,  August 26, 2010 12:50 PM  Follow-up for Phone Call        sent to [facility] Follow-up by: Orlan Leavens RMA,  August 26, 2010 12:51 PM    Prescriptions: HYDROCHLOROTHIAZIDE 25 MG TABS (HYDROCHLOROTHIAZIDE) 1 by mouth once daily  #90 x 1   Entered by:   Orlan Leavens RMA   Authorized by:   Newt Lukes MD   Signed by:   Orlan Leavens RMA on 08/26/2010   Method used:   Electronically to        [facility] # [id number] (retail)       [address] [place], [place]  [address]       Ph: [phone]       Fax: [phone]   RxID:   [id number] BENICAR 20 MG TABS (OLMESARTAN MEDOXOMIL) 1 by mouth once daily  #90 x 1   Entered by:   Orlan Leavens RMA   Authorized by:   Newt Lukes MD   Signed by:   Orlan Leavens RMA on 08/26/2010   Method used:   Electronically to        [facility] # [phone]* (retail)       [address] [place], [place]  [address]       Ph: [phone]       Fax: [phone]   RxID:   [id number]

## 2010-09-10 ENCOUNTER — Encounter: Payer: Self-pay | Admitting: *Deleted

## 2010-09-20 ENCOUNTER — Encounter: Payer: Self-pay | Admitting: Internal Medicine

## 2010-09-25 ENCOUNTER — Ambulatory Visit (INDEPENDENT_AMBULATORY_CARE_PROVIDER_SITE_OTHER): Payer: Medicare Other | Admitting: Internal Medicine

## 2010-09-25 ENCOUNTER — Encounter: Payer: Self-pay | Admitting: Internal Medicine

## 2010-09-25 DIAGNOSIS — E669 Obesity, unspecified: Secondary | ICD-10-CM

## 2010-09-25 DIAGNOSIS — I10 Essential (primary) hypertension: Secondary | ICD-10-CM

## 2010-09-25 NOTE — Assessment & Plan Note (Signed)
Weight gain reviewed - need for improved exercise and diet efforts reviewed Wt Readings from Last 3 Encounters:  09/25/10 173 lb 6.4 oz (78.654 kg)  06/26/10 170 lb 3.2 oz (77.202 kg)  03/24/10 168 lb 12.8 oz (76.567 kg)

## 2010-09-25 NOTE — Assessment & Plan Note (Signed)
At goal on current therapy - pt prefers separate dosing of these meds at this time but consider combination therapy in future  BP Readings from Last 3 Encounters:  09/25/10 120/70  07/10/10 132/82  06/26/10 144/82

## 2010-09-25 NOTE — Progress Notes (Signed)
  Subjective:    Patient ID: Darlene Park, female    DOB: 1945/12/20, 65 y.o.   MRN: [id number]  HPI Here for follow up - reviewed chronic medical issues:  HTN  - home BP log reviewed changed benicar to generic 02/2010 for cost, ineffective Added hctx to ARB the patient reports compliance with medication(s) as prescribed. Denies adverse side effects. no CP, HA or symptoms of dizziness  colon cancer hx - s/p resection July 2010 onc (murinson) rec no adjuvent tx - rec colo 1 year f/u CT 06/2009 - no evidence of recurrent dz pt feeling well post op and gaining weight again  postop anemia - mild with surg loss no anemia prior to colo dx of cancer - not taking MVI or iron/b12 supp  Obesity - progressive Prior unintentional loss of weight summer 2010 - related to colo cancer/tx trend now reversed - increase appetite, poor food choices and little exercise -   Past Medical History  Diagnosis Date  . VITAMIN D DEFICIENCY 11/29/2008  . HYPERTENSION 11/14/2008  . GERD 11/14/2008  . URINARY INCONTINENCE 11/14/2008  . Malignant neoplasm of cecum 03/28/2009    No chemo/adj tx per onc    Review of Systems  Constitutional: Positive for unexpected weight change (weight gain). Negative for fever and fatigue.  Respiratory: Negative for cough and shortness of breath.   Cardiovascular: Negative for chest pain.      Objective:   Physical Exam BP 120/70  Pulse 79  Temp(Src) 98.5 F (36.9 C) (Oral)  Ht 5\' 1"  (1.549 m)  Wt 173 lb 6.4 oz (78.654 kg)  BMI 32.76 kg/m2  SpO2 98% Physical Exam  Constitutional: She is oriented to person, place, and time. She appears well-developed and well-nourished. No distress.  Eyes: Conjunctivae and EOM are normal. Pupils are equal, round, and reactive to light. No scleral icterus.  Neck: Normal range of motion. Neck supple. No JVD present. No thyromegaly present.  Cardiovascular: Normal rate, regular rhythm and normal heart sounds.  No murmur  heard. Pulmonary/Chest: Effort normal and breath sounds normal. No respiratory distress. She has no wheezes. Psychiatric: She has a normal mood and affect. Her behavior is normal. Judgment and thought content normal.   Lab Results  Component Value Date   WBC 5.6 03/28/2009   HGB 11.9 11/08/2009   HCT 34.4* 11/08/2009   PLT 250 11/08/2009   CHOL 198 11/29/2008   TRIG 67.0 11/29/2008   HDL 63.20 11/29/2008   ALT 13 11/08/2009   AST 17 11/08/2009   NA 141 11/08/2009   K 3.7 11/08/2009   CL 106 11/08/2009   CREATININE 0.62 11/08/2009   BUN 11 11/08/2009   CO2 22 11/08/2009   TSH 0.66 11/29/2008    Assesment and Plan: Assessment & Plan:  See problem list. Medications and labs reviewed today. Time spent with the patient today 25 minutes, greater than 50% time spent counseling patient on weight gain, diet/exercise needs and blood pressure control; also medication review.

## 2010-09-25 NOTE — Patient Instructions (Signed)
It was good to see you today. Medications reviewed, no changes at this time. Let us know if your would combination pill for Benicar + HCTZ at next refill  Work on lifestyle changes as discussed (low fat, low carb diet diet; improved exercise efforts; weight loss) to control sugar, blood pressure and cholesterol levels and/or reduce risk of developing other medical problems. Please schedule followup in 6 months for blood pressure and weight check, call sooner if problems.

## 2010-09-28 LAB — COMPREHENSIVE METABOLIC PANEL
ALT: 11 U/L (ref 0–35)
Calcium: 8.6 mg/dL (ref 8.4–10.5)
Glucose, Bld: 68 mg/dL — ABNORMAL LOW (ref 70–99)
Sodium: 138 mEq/L (ref 135–145)
Total Protein: 5.9 g/dL — ABNORMAL LOW (ref 6.0–8.3)

## 2010-09-28 LAB — CBC
HCT: 33.1 % — ABNORMAL LOW (ref 36.0–46.0)
Hemoglobin: 11.1 g/dL — ABNORMAL LOW (ref 12.0–15.0)
WBC: 9 10*3/uL (ref 4.0–10.5)

## 2010-09-28 LAB — HEMOGLOBIN AND HEMATOCRIT, BLOOD
HCT: 37.7 % (ref 36.0–46.0)
Hemoglobin: 12.5 g/dL (ref 12.0–15.0)

## 2010-09-28 LAB — BASIC METABOLIC PANEL
GFR calc non Af Amer: >60 mL/min (ref >60)
Glucose, Bld: 94 mg/dL (ref 70–99)
Potassium: 4.3 mEq/L (ref 3.5–5.1)
Sodium: 138 mEq/L (ref 135–145)

## 2010-11-04 NOTE — Op Note (Signed)
[name], [name]          ACCOUNT NO.:  [id number]   MEDICAL RECORD NO.:  [id number]          PATIENT TYPE:  INP   LOCATION:  0010                         FACILITY:  [facility]   PHYSICIAN:  Sharlet Salina T. Hoxworth, M.D.DATE OF BIRTH:  1946/05/19   DATE OF PROCEDURE:  01/14/2009  DATE OF DISCHARGE:                               OPERATIVE REPORT   PREOPERATIVE DIAGNOSIS:  Mass, right colon.   POSTOPERATIVE DIAGNOSIS:  Mass, right colon.   SURGICAL PROCEDURE:  Laparoscopic-assisted right hemicolectomy with  anastomosis.   SURGEON:  Lorne Skeens. Hoxworth, M.D.   ANESTHESIA:  General.   BRIEF HISTORY:  Darlene Park is a 65 year old female who presents with  2 to 3 months of postprandial gas, bloating, cramping and distention.  This subsequently lead to a recent colonoscopy by Dr. Arlyce Dice with  findings of a large friable mass in the cecum.  Biopsies revealed  villous adenoma with high-grade dysplasia suspicious for adenocarcinoma.  CT scan has shown a large mass involving the ileocecal valve up to about  9 cm in diameter with partial obstruction.  I have recommended  proceeding with laparoscopic and possible open right colectomy.  The  nature of the procedure, its indications, risks of anesthetic  complications, bleeding, infection, anastomotic leak and others were  discussed and understood.  She was now brought to the operating room for  this procedure.   DESCRIPTION OF OPERATION:  Following mechanical antibiotic bowel prep at  home, the patient was brought to the operating room and placed in supine  position on the operating table and general endotracheal anesthesia was  induced.  Foley catheter was placed.  She was carefully positioned in  semilithotomy position.  PAS were placed.  She received preoperative  broad-spectrum IV antibiotics.  The abdomen was then widely sterilely  prepped and draped.  Correct patient procedure were verified.  Access  was obtained with a 1 cm  incision just above the umbilicus in the  midline and dissection carried down the midline fascia which was incised  for 1 cm and the peritoneum entered under direct vision.  Through a  mattress suture of 0-0 Vicryl the Hasson trocar was placed and  pneumoperitoneum established.  Under direct vision, 5 mm trocars were  placed in the left lower quadrant and suprapubic area.  Laparoscopy  showed a large mass in the cecum with some chronic appearing  inflammatory attachments to the lateral pelvic sidewall and some mild  dilatation of the distal small bowel.  The liver grossly appeared  normal.  Initially the patient was placed in steep reverse Trendelenburg  and she was very thin and I was able to identify the ureter and the  iliac vessels from a medial to lateral view before even incising the  peritoneum.  The peritoneum of the cecum and terminal ileum was then  incised with cautery just anterior to the structures and the mesentery  of the cecum and terminal ileum bluntly dissected up out of the  retroperitoneum leaving the ureter well exposed and protected  posteriorly.  This dissection was carried up really just short way until  the duodenum was encountered and  dissected inferiorly and protected.  The ileocolic vessels were identified but not divided at this time.  Following this to medial to lateral dissection, I carried the peritoneal  incision up around lateral to the cecum and there were fairly extensive  chronic inflammatory adhesions of the cecum up to the lateral pelvic  sidewall.  These were partially divided, working anterior-posterior.  I  then placed the patient in reverse Trendelenburg and the hepatic flexure  was exposed and this was mobilized dividing the paracolic ligament with  the LigaSure and freeing the proximal transverse colon as well.  I then  carried the peritoneal dissection lateral to the right colon, mobilizing  the more distal right colon working back down to the  mass and then  finally the inflammatory adhesions along the pelvis were isolated.  I  could clearly see they were away from the ureter and iliac vessels and  these were divided, taking some of the peritoneum and the soft tissue  deep to this along with the cecum until the cecum was then completely  mobilized down to its mesentery and flopped easily over to the midline  and some final filmy attachments of the mesentery were further mobilized  completely mobilizing the mesentery of entire right colon over to the  midline.  At this point the patient was placed flat and the trocars were  removed.  The incision just above the umbilicus was extended inferiorly  skirting the umbilicus for about another 3-4 cm and then fascia was  divided similar length.  The wound protector was placed.  I was then  able to grasp the mass using the base of the appendix and bring it up to  the wound and although it was a snug fit, with careful manipulation, I  was able to bring the mass up through the wound protector and then the  terminal ileum and entire right colon easily came up through the wound  protector with excellent exposure all the way to the base of the  mesentery.  Points of proximal and distal resection at the terminal  ileum leaving several centimeters of ileum due to the proximity of the  mass to the ileocecal valve and at the proximal transverse colon were  chosen and the mesenteric resection planned.  The mesentery of the  involved segment was then sequentially divided either with the LigaSure  or larger vessels were also additionally clamped and ligated or suture  ligated.  The ileocolic and right colic vessels were taken very near  their origin with the associated lymph nodes.  There did appear to be  some mildly enlarged lymph nodes but these could certainly be  inflammatory from their appearance.  After complete division of the  mesentery, a functional end-to-end anastomosis was created to  the  terminal ileum and transverse colon with a single firing the GIA 75 mm  stapler.  The staple line was intact and without bleeding.  The common  enterotomy was closed and the specimen removed with a single firing the  TA-60 stapler.  Following this, the anastomosis was reinforced at its  crotch with a interrupted silk and the mesenteric defect was closed with  interrupted silks.  The abdomen was irrigated and there was no evidence  of bleeding.  The viscera returned to their anatomic position.  The  midline fascia was closed with running #1 PDS begun at either end of the  incision and tied centrally.  Skin was closed with subcuticular Monocryl  and Dermabond.  Sponge, needle and instrument counts correct.  The  patient taken to recovery in good condition.      Lorne Skeens. Hoxworth, M.D.  Electronically Signed     BTH/MEDQ  D:  01/14/2009  T:  01/15/2009  Job:  [id number]   cc:   Barbette Hair. Arlyce Dice, MD,FACG  [address]  [place]  [place] [address]   Vikki Ports A. Felicity Coyer, MD  [address] [place], [place] [address]

## 2011-02-17 IMAGING — CT CT ABD-PELV W/ CM
2 of 5 series · 16 of 46 positions shown, 18 images · IV contrast (Omnipaque 300)
Comparison: 12/28/2008

CLINICAL DATA: History of colon cancer in the cecum.  Surgery.  No
abdominal complaints.

CT ABDOMEN AND PELVIS WITH CONTRAST
TECHNIQUE: Multidetector CT imaging of the abdomen and pelvis was
performed following the standard protocol during bolus
administration of intravenous contrast.
Contrast: 100  ml Xmnipaque-2II

[Series 2: abd/ pel · axial · 0.64mm/px · z∈[-435,-85]mm · 13 of 82 slices shown, 15 images]
[im 6/82  soft-tissue]
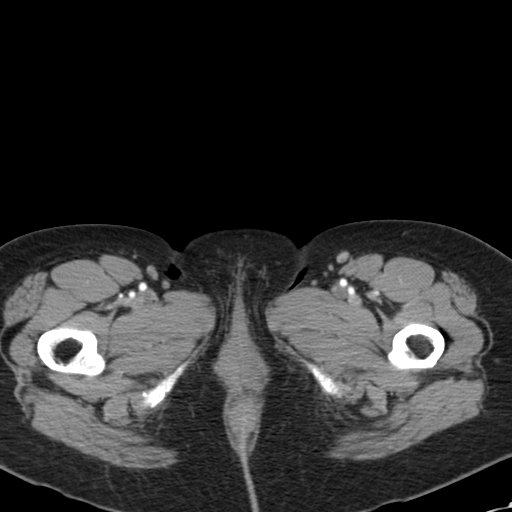
[im 6/82  bone]
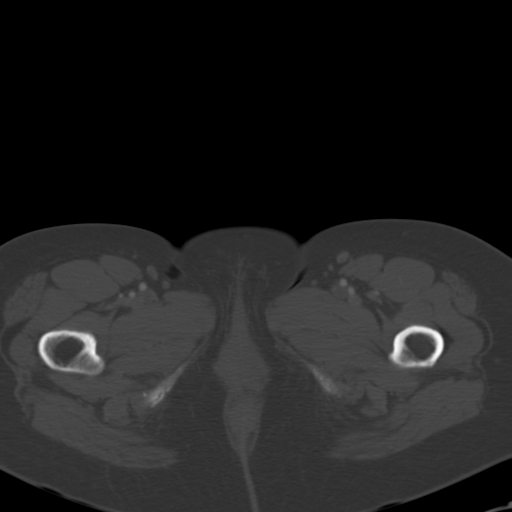
[im 12/82  soft-tissue]
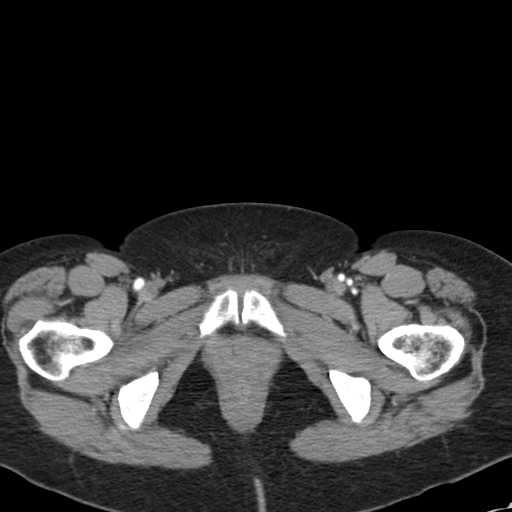
[im 18/82  soft-tissue]
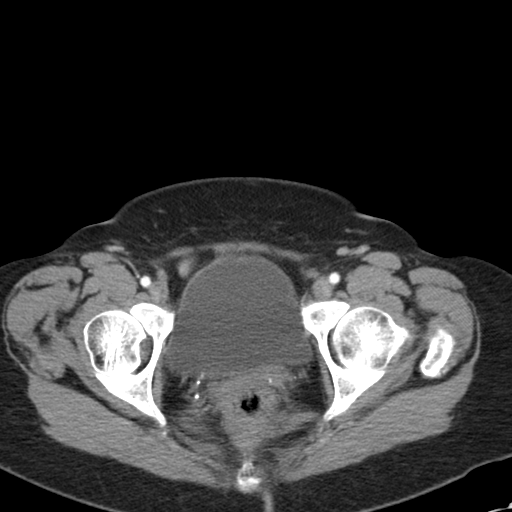
[im 24/82  soft-tissue]
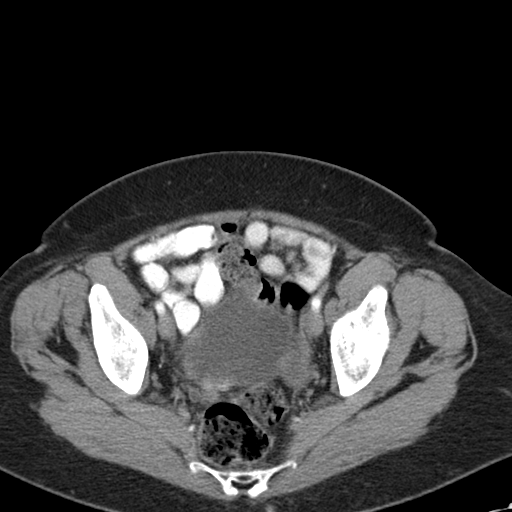
[im 29/82  soft-tissue]
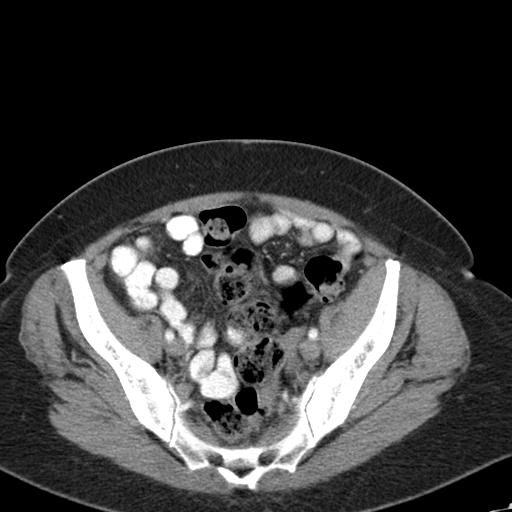
[im 35/82  soft-tissue]
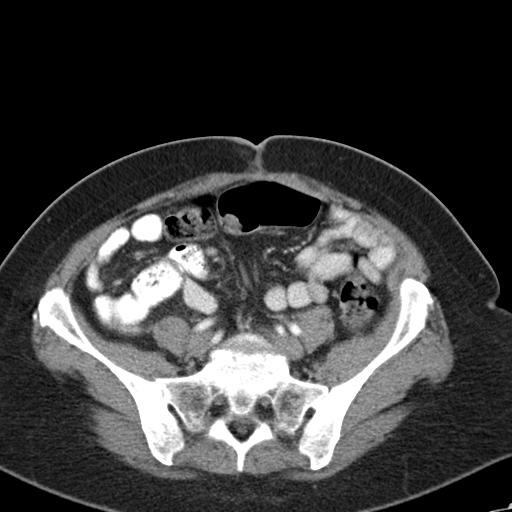
[im 41/82  soft-tissue]
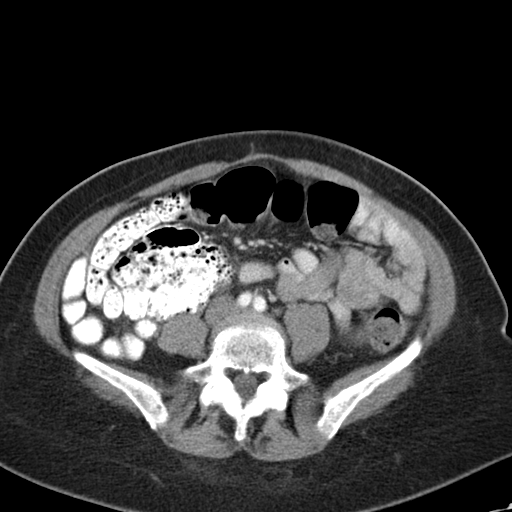
[im 47/82  soft-tissue]
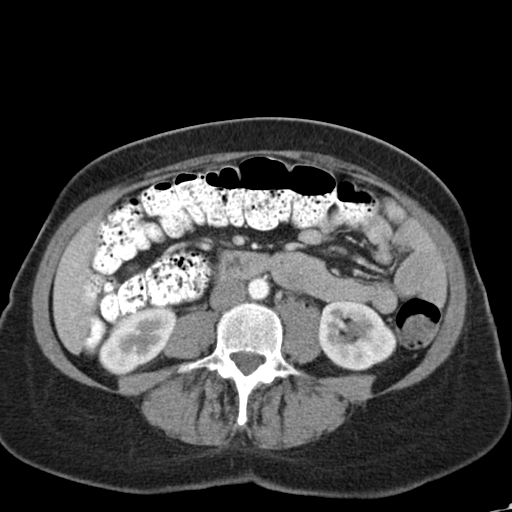
[im 53/82  soft-tissue]
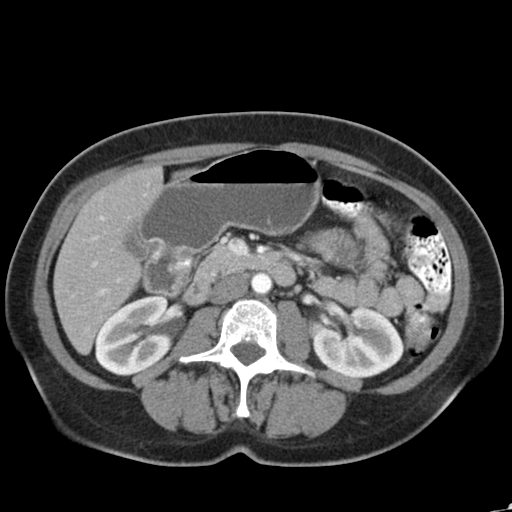
[im 53/82  bone]
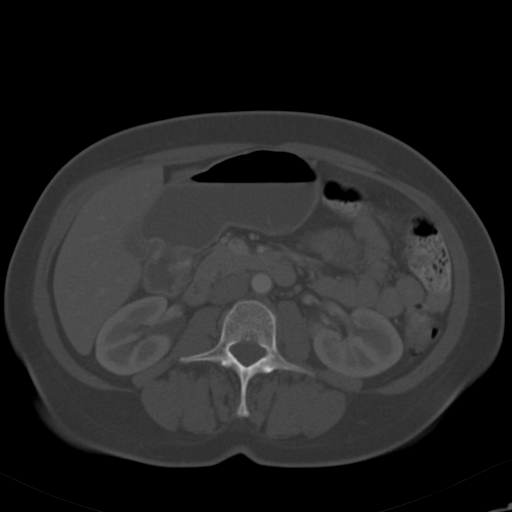
[im 58/82  soft-tissue]
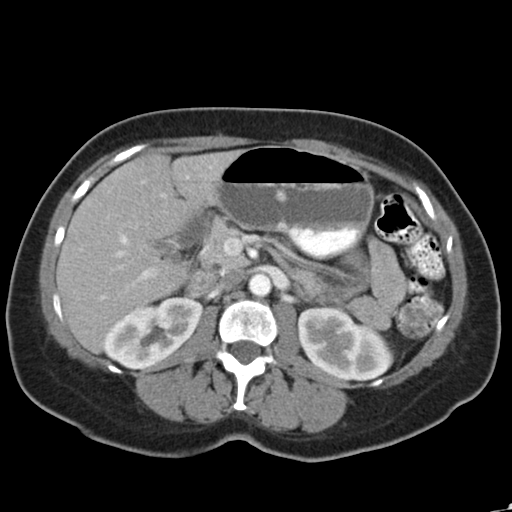
[im 64/82  soft-tissue]
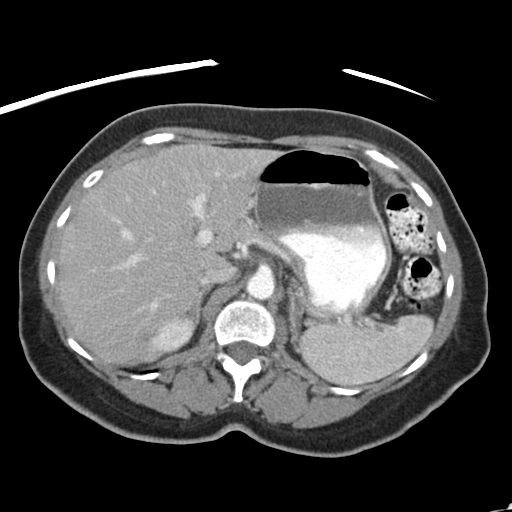
[im 70/82  soft-tissue]
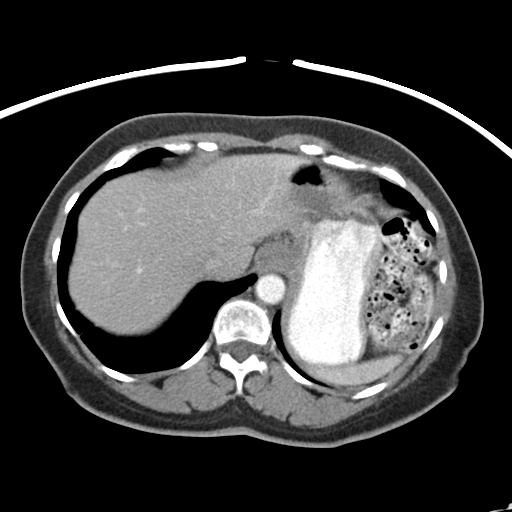
[im 76/82  soft-tissue]
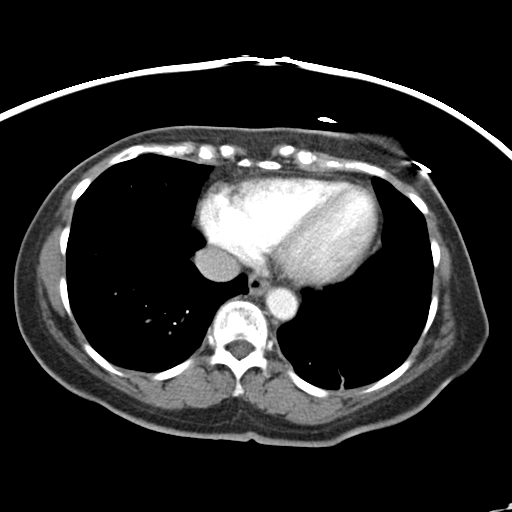

[Series 602: <mpr range> · coronal · 0.83mm/px · 3 of 103 slices shown]
[im 35/103  soft-tissue]
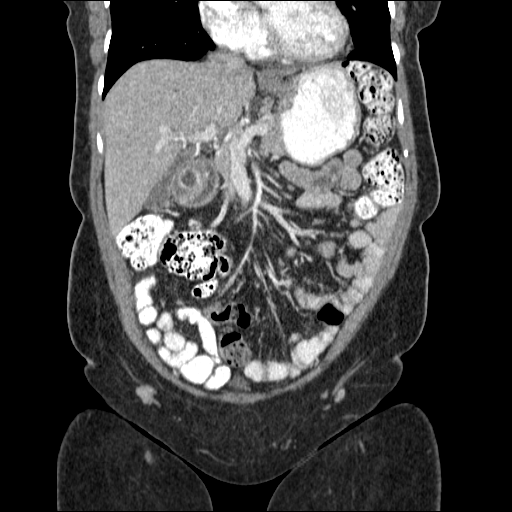
[im 46/103  soft-tissue]
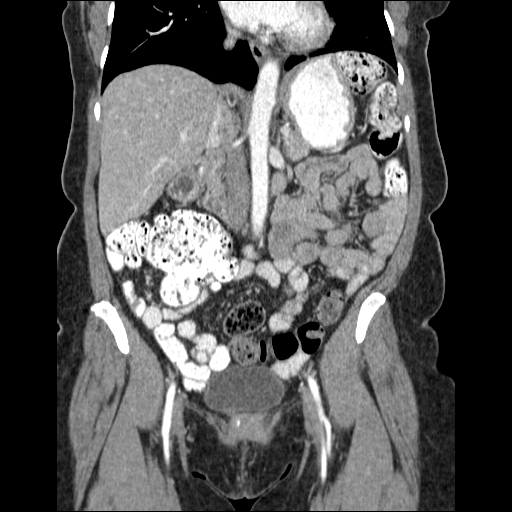
[im 57/103  soft-tissue]
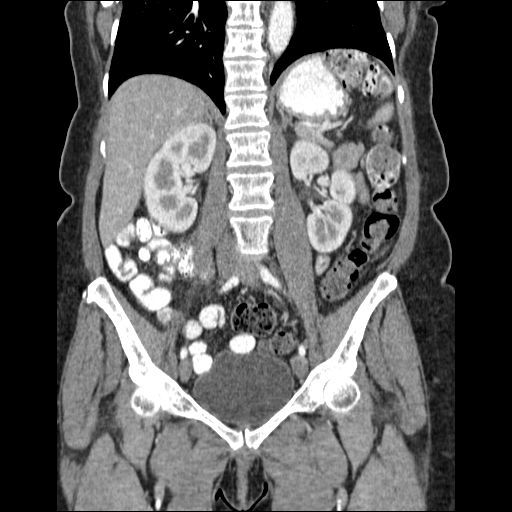

[16 of 46 positions shown; findings below may reference images not displayed]

FINDINGS: Mild bibasilar scar or atelectasis.  Heart size upper
limits of normal without pericardial or pleural effusion.  Mild
fatty infiltration of the liver.  No change in too small to
characterize right hepatic lobe lesion on image 29.  Normal spleen,
stomach, pancreas, gallbladder, biliary tract, adrenal glands.
Interpolar left renal cyst measuring approximately 8 mm.  Normal
right kidney.

No retroperitoneal or retrocrural adenopathy.

Colonic stool burden suggests constipation.

Partial right hemicolectomy.  Resolution of previously described
small bowel dilatation. Normal small bowel without ascites.

  The ileocolic mesenteric adenopathy described on the prior exam
is no longer present.

No pelvic adenopathy.    Normal urinary bladder.  Hysterectomy.
Probable residual follicle at 1.6 cm in the left ovary on image 58.
Right ovary not well visualized. No significant free fluid.  No
acute osseous abnormality. .
IMPRESSION: 1.  Interval resection of previous described cecal mass.  No
evidence of recurrent/residual or metastatic disease.
2.  A too small to characterize hepatic lesion is unchanged and
most likely a tiny cyst.
3.  Mild fatty infiltration of the liver.
4.  Suspect constipation.
5.  Hysterectomy.

## 2011-03-18 ENCOUNTER — Other Ambulatory Visit: Payer: Self-pay | Admitting: Internal Medicine

## 2011-03-26 ENCOUNTER — Ambulatory Visit (INDEPENDENT_AMBULATORY_CARE_PROVIDER_SITE_OTHER): Payer: Medicare Other | Admitting: Internal Medicine

## 2011-03-26 ENCOUNTER — Encounter: Payer: Self-pay | Admitting: Internal Medicine

## 2011-03-26 ENCOUNTER — Other Ambulatory Visit (INDEPENDENT_AMBULATORY_CARE_PROVIDER_SITE_OTHER): Payer: Medicare Other

## 2011-03-26 VITALS — BP 120/72 | HR 82 | Temp 98.6°F | Ht 62.0 in | Wt 162.8 lb

## 2011-03-26 DIAGNOSIS — I10 Essential (primary) hypertension: Secondary | ICD-10-CM

## 2011-03-26 DIAGNOSIS — M1710 Unilateral primary osteoarthritis, unspecified knee: Secondary | ICD-10-CM

## 2011-03-26 DIAGNOSIS — E669 Obesity, unspecified: Secondary | ICD-10-CM

## 2011-03-26 DIAGNOSIS — Z23 Encounter for immunization: Secondary | ICD-10-CM

## 2011-03-26 LAB — LIPID PANEL
HDL: 50.4 mg/dL (ref >39.00)
LDL Cholesterol: 119 mg/dL — ABNORMAL HIGH (ref 0–99)
Total CHOL/HDL Ratio: 4
VLDL: 14.4 mg/dL (ref 0.0–40.0)

## 2011-03-26 MED ORDER — GLUCOSAMINE CHONDROITIN COMPLX PO TABS: 1.0000 | ORAL_TABLET | Freq: Two times a day (BID) | ORAL | Status: DC

## 2011-03-26 NOTE — Assessment & Plan Note (Signed)
Weight loss reviewed - encouraged continued exercise and diet efforts  Also check tsh and chol today  Wt Readings from Last 3 Encounters:  03/26/11 162 lb 12.8 oz (73.846 kg)  09/25/10 173 lb 6.4 oz (78.654 kg)  06/26/10 170 lb 3.2 oz (77.202 kg)

## 2011-03-26 NOTE — Progress Notes (Signed)
  Subjective:    Patient ID: Darlene Park, female    DOB: [birth year], 65 y.o.   MRN: [id number]  HPI  Here for follow up - reviewed chronic medical issues:  HTN  - home BP log reviewed changed benicar to generic 02/2010 for cost, but ineffective Added hctx to ARB the patient reports compliance with medication(s) as prescribed. Denies adverse side effects. no CP, HA or symptoms of dizziness  colon cancer hx - s/p resection July 2010 onc (murinson) rec no adjuvent tx - rec colo 1 year f/u CT 06/2009 - no evidence of recurrent dz pt feeling well post op and gaining weight again  Obesity - Prior unintentional loss of weight summer 2010 - related to colo cancer/tx Upward trend now reversed - better food choices and improved exercise -   Past Medical History  Diagnosis Date  . VITAMIN D DEFICIENCY   . HYPERTENSION   . GERD   . URINARY INCONTINENCE   . Malignant neoplasm of cecum 03/28/2009    resection; No chemo/adj tx per onc    Review of Systems  Constitutional: Negative for fever and fatigue.  Respiratory: Negative for cough and shortness of breath.   Musculoskeletal: Positive for arthralgias. Negative for back pain and joint swelling.       Objective:   Physical Exam BP 120/72  Pulse 82  Temp(Src) 98.6 F (37 C) (Oral)  Ht 5\' 2"  (1.575 m)  Wt 162 lb 12.8 oz (73.846 kg)  BMI 29.78 kg/m2  SpO2 98% Wt Readings from Last 3 Encounters:  03/26/11 162 lb 12.8 oz (73.846 kg)  09/25/10 173 lb 6.4 oz (78.654 kg)  06/26/10 170 lb 3.2 oz (77.202 kg)   Constitutional: She is overweight;. She appears well-developed and well-nourished. No distress.  Neck: Normal range of motion. Neck supple. No JVD present. No thyromegaly present.  Cardiovascular: Normal rate, regular rhythm and normal heart sounds.  No murmur heard. No BLE edema. Pulmonary/Chest: Effort normal and breath sounds normal. No respiratory distress. She has no wheezes. Musculoskeletal: B knee - boggy synovitis -  tender to palpation over joint line; FROM and ligamentous function intact Psychiatric: She has a normal mood and affect. Her behavior is normal. Judgment and thought content normal.   Lab Results  Component Value Date   WBC 5.6 03/28/2009   HGB 11.9 11/08/2009   HCT 34.4* 11/08/2009   PLT 250 11/08/2009   CHOL 198 11/29/2008   TRIG 67.0 11/29/2008   HDL 63.20 11/29/2008   ALT 13 11/08/2009   AST 17 11/08/2009   NA 141 11/08/2009   K 3.7 11/08/2009   CL 106 11/08/2009   CREATININE 0.62 11/08/2009   BUN 11 11/08/2009   CO2 22 11/08/2009   TSH 0.66 11/29/2008       Assessment & Plan:  See problem list. Medications and labs reviewed today.  Knee pain - L>R - no recent trauma or swelling - xray ER 03/2010 following injury reviewed: mod tricomp OA - advised glucosamine chon and tylenol - consider steroid injection if acute flare

## 2011-03-26 NOTE — Patient Instructions (Addendum)
It was good to see you today. Medications reviewed, no changes at this time. Try glucosamine chondroitin for your knee arthritis and tylenol as needed - also neoprene sleve at night as needed - call if pain getting worse or any swelling Test(s) ordered today. Your results will be called to you after review (48-72hours after test completion). If any changes need to be made, you will be notified at that time. Let us know if you would like combination pill for Benicar + HCTZ at next refill  Good job on the weight loss so far! Continue to work on lifestyle changes as you are doing (low fat, low carb diet diet; improved exercise efforts; weight loss) to control sugar, blood pressure and cholesterol levels and/or reduce risk of developing other medical problems. Please schedule followup in 6 months for blood pressure and weight check, call sooner if problems.

## 2011-03-26 NOTE — Assessment & Plan Note (Signed)
At goal on current therapy - pt prefers separate dosing of these meds at this time but consider combination therapy in future  BP Readings from Last 3 Encounters:  03/26/11 120/72  09/25/10 120/70  07/10/10 132/82

## 2011-07-16 ENCOUNTER — Telehealth: Payer: Self-pay | Admitting: *Deleted

## 2011-07-16 NOTE — Telephone Encounter (Signed)
error 

## 2011-08-04 ENCOUNTER — Telehealth: Payer: Self-pay | Admitting: *Deleted

## 2011-08-04 NOTE — Telephone Encounter (Signed)
Message copied by Marlowe Kays on Tue Aug 04, 2011  9:06 AM ------      Message from: Harlow Mares D      Created: Mon Jul 27, 2011  1:56 PM                   ----- Message -----         From: Louis Meckel, MD         Sent: 07/16/2011  12:29 PM           To: Harlow Mares, CMA            She is past due for colonoscopy      ----- Message -----         From: Harlow Mares, CMA         Sent: 07/16/2011  10:12 AM           To: Louis Meckel, MD            Dr Arlyce Dice I am going over the colonoscopy reports for the recall projects and I ran across this chart and on the report the recall date is 12/2009, could you advise me when pt is due for recall.                  Thanks      [employer]

## 2011-08-06 NOTE — Telephone Encounter (Signed)
L/M for pt to return call because she is past due on scheduling her colonoscopy

## 2011-08-17 NOTE — Telephone Encounter (Signed)
NO RETURN CALLS BACK FROM PT. WHO IS PAST DUE FOR HER COLONOSCOPY

## 2011-09-24 ENCOUNTER — Encounter: Payer: Self-pay | Admitting: Internal Medicine

## 2011-09-24 ENCOUNTER — Ambulatory Visit (INDEPENDENT_AMBULATORY_CARE_PROVIDER_SITE_OTHER): Payer: Medicare Other | Admitting: Internal Medicine

## 2011-09-24 VITALS — BP 104/70 | HR 82 | Temp 98.4°F | Resp 16 | Ht 62.0 in | Wt 158.0 lb

## 2011-09-24 DIAGNOSIS — E669 Obesity, unspecified: Secondary | ICD-10-CM

## 2011-09-24 DIAGNOSIS — I10 Essential (primary) hypertension: Secondary | ICD-10-CM

## 2011-09-24 DIAGNOSIS — C180 Malignant neoplasm of cecum: Secondary | ICD-10-CM

## 2011-09-24 MED ORDER — OLMESARTAN MEDOXOMIL 20 MG PO TABS: 20.0000 mg | ORAL_TABLET | Freq: Every day | ORAL | Status: DC

## 2011-09-24 MED ORDER — HYDROCHLOROTHIAZIDE 25 MG PO TABS: 25.0000 mg | ORAL_TABLET | Freq: Every day | ORAL | Status: DC

## 2011-09-24 NOTE — Assessment & Plan Note (Signed)
colon cancer hx - s/p resection July 2010 onc (murinson) rec no adjuvent tx - rec colo 1 year f/u CT 06/2009 - no evidence of recurrent dz Overdue for colo follow up - pt aware of same and agrees to call for appt

## 2011-09-24 NOTE — Assessment & Plan Note (Signed)
At goal on current therapy - pt prefers separate dosing of these meds at this time but consider combination therapy in future Ok to hold HCTZ if low BP - pt will monitor  BP Readings from Last 3 Encounters:  09/24/11 104/70  03/26/11 120/72  09/25/10 120/70

## 2011-09-24 NOTE — Patient Instructions (Signed)
It was good to see you today. Medications reviewed, no changes at this time. - Refill on medication(s) as discussed today. Continue to work on lifestyle changes as discussed (low fat, low carb diet diet; improved exercise efforts; weight loss) to control sugar, blood pressure and cholesterol levels and/or reduce risk of developing other medical problems. Called Dr. Marzetta Board office at [phone] schedule your  colonoscopy - this was due July 2012 Please schedule followup in 6-12 months for blood pressure and weight check, call sooner if problems.

## 2011-09-24 NOTE — Assessment & Plan Note (Signed)
Weight loss reviewed - encouraged continued exercise and diet efforts   Wt Readings from Last 3 Encounters:  09/24/11 158 lb (71.668 kg)  03/26/11 162 lb 12.8 oz (73.846 kg)  09/25/10 173 lb 6.4 oz (78.654 kg)

## 2011-09-24 NOTE — Progress Notes (Signed)
  Subjective:    Patient ID: Darlene Park, female    DOB: Oct 30, 1945, 66 y.o.   MRN: [id number]  HPI  Here for follow up - reviewed chronic medical issues:  HTN  - home BP log reviewed changed benicar to generic 02/2010 for cost, but ineffective Added hctz to ARB due to poor control on ARB alone early 2012 the patient reports compliance with medication(s) as prescribed. Denies adverse side effects. no chest pain, headache or symptoms of dizziness  colon cancer hx - s/p resection July 2010 onc ([facility]) rec no adjuvent tx - rec colo 1 year f/u CT 06/2009 - no evidence of recurrent dz Patient still overdue for repeat colo  Obesity - Prior unintentional loss of weight summer 2010 - related to colo cancer/tx Upward trend now reversed - better food choices and improved exercise -   Past Medical History  Diagnosis Date  . VITAMIN D DEFICIENCY   . HYPERTENSION   . GERD   . URINARY INCONTINENCE   . Malignant neoplasm of cecum 03/28/2009    resection; No chemo/adj tx per onc    Review of Systems  Constitutional: Negative for fever and fatigue.  Respiratory: Negative for cough and shortness of breath.   Musculoskeletal: Positive for arthralgias. Negative for back pain and joint swelling.       Objective:   Physical Exam  BP 104/70  Pulse 82  Temp(Src) 98.4 F (36.9 C) (Oral)  Resp 16  Ht 5\' 2"  (1.575 m)  Wt 158 lb (71.668 kg)  BMI 28.90 kg/m2  SpO2 99% Wt Readings from Last 3 Encounters:  09/24/11 158 lb (71.668 kg)  03/26/11 162 lb 12.8 oz (73.846 kg)  09/25/10 173 lb 6.4 oz (78.654 kg)   Constitutional: She is overweight;. She appears well-developed and well-nourished. No distress.  Neck: Normal range of motion. Neck supple. No JVD present. No thyromegaly present.  Cardiovascular: Normal rate, regular rhythm and normal heart sounds.  No murmur heard. Fatty ankles but no BLE edema. Pulmonary/Chest: Effort normal and breath sounds normal. No respiratory distress.  She has no wheezes. Musculoskeletal: B knee - boggy synovitis - tender to palpation over joint line; FROM and ligamentous function intact Psychiatric: She has a normal mood and affect. Her behavior is normal. Judgment and thought content normal.   Lab Results  Component Value Date   WBC 5.1 11/08/2009   HGB 11.9 11/08/2009   HCT 34.4* 11/08/2009   PLT 250 11/08/2009   CHOL 184 03/26/2011   TRIG 72.0 03/26/2011   HDL 50.40 03/26/2011   ALT 13 11/08/2009   AST 17 11/08/2009   NA 141 11/08/2009   K 3.7 11/08/2009   CL 106 11/08/2009   CREATININE 0.62 11/08/2009   BUN 11 11/08/2009   CO2 22 11/08/2009   TSH 0.53 03/26/2011      Assessment & Plan:  See problem list. Medications and labs reviewed today.

## 2012-01-15 ENCOUNTER — Encounter: Payer: Self-pay | Admitting: Gastroenterology

## 2012-03-24 ENCOUNTER — Ambulatory Visit: Payer: Medicare Other | Admitting: Internal Medicine

## 2012-04-13 ENCOUNTER — Other Ambulatory Visit: Payer: Self-pay | Admitting: Internal Medicine

## 2012-04-14 ENCOUNTER — Ambulatory Visit (INDEPENDENT_AMBULATORY_CARE_PROVIDER_SITE_OTHER): Payer: Medicare Other | Admitting: Internal Medicine

## 2012-04-14 ENCOUNTER — Encounter: Payer: Self-pay | Admitting: Gastroenterology

## 2012-04-14 ENCOUNTER — Other Ambulatory Visit (INDEPENDENT_AMBULATORY_CARE_PROVIDER_SITE_OTHER): Payer: Medicare Other

## 2012-04-14 ENCOUNTER — Encounter: Payer: Self-pay | Admitting: Internal Medicine

## 2012-04-14 VITALS — BP 110/70 | HR 75 | Temp 97.6°F | Ht 62.0 in | Wt 165.0 lb

## 2012-04-14 DIAGNOSIS — R5383 Other fatigue: Secondary | ICD-10-CM

## 2012-04-14 DIAGNOSIS — I10 Essential (primary) hypertension: Secondary | ICD-10-CM

## 2012-04-14 DIAGNOSIS — Z0000 Encounter for general adult medical examination without abnormal findings: Secondary | ICD-10-CM

## 2012-04-14 DIAGNOSIS — C180 Malignant neoplasm of cecum: Secondary | ICD-10-CM

## 2012-04-14 DIAGNOSIS — E669 Obesity, unspecified: Secondary | ICD-10-CM

## 2012-04-14 DIAGNOSIS — R5381 Other malaise: Secondary | ICD-10-CM

## 2012-04-14 DIAGNOSIS — Z1211 Encounter for screening for malignant neoplasm of colon: Secondary | ICD-10-CM

## 2012-04-14 DIAGNOSIS — Z23 Encounter for immunization: Secondary | ICD-10-CM

## 2012-04-14 LAB — CBC WITH DIFFERENTIAL/PLATELET
Basophils Absolute: 0 10*3/uL (ref 0.0–0.1)
Basophils Relative: 0.7 % (ref 0.0–3.0)
Eosinophils Absolute: 0.2 10*3/uL (ref 0.0–0.7)
Lymphocytes Relative: 25.3 % (ref 12.0–46.0)
MCHC: 33.2 g/dL (ref 30.0–36.0)
MCV: 90.3 fl (ref 78.0–100.0)
Monocytes Absolute: 0.4 10*3/uL (ref 0.1–1.0)
Neutrophils Relative %: 62.6 % (ref 43.0–77.0)
Platelets: 283 10*3/uL (ref 150.0–400.0)
RBC: 4.08 Mil/uL (ref 3.87–5.11)
RDW: 13.3 % (ref 11.5–14.6)

## 2012-04-14 NOTE — Assessment & Plan Note (Signed)
colon cancer hx - s/p resection July 2010 onc ([facility]) rec no adjuvent tx - rec colo 1 year f/u CT 06/2009 - no evidence of recurrent dz Overdue for colo follow up - pt aware of same and agrees to follow up today for appt - new referral to GI done

## 2012-04-14 NOTE — Progress Notes (Signed)
Subjective:    Patient ID: Darlene Park, female    DOB: 1946/02/01, 66 y.o.   MRN: [id number]  HPI  Here for medicare wellness  Diet: heart healthy Physical activity: sedentary Depression/mood screen: negative Hearing: intact to whispered voice Visual acuity: grossly normal, performs annual eye exam  ADLs: capable Fall risk: none Home safety: good Cognitive evaluation: intact to orientation, naming, recall and repetition EOL planning: adv directives, full code/ I agree  I have personally reviewed and have noted 1. The patient's medical and social history 2. Their use of alcohol, tobacco or illicit drugs 3. Their current medications and supplements 4. The patient's functional ability including ADL's, fall risks, home safety risks and hearing or visual impairment. 5. Diet and physical activities 6. Evidence for depression or mood disorders  Also reviewed chronic medical issues:  HTN  - home BP log reviewed changed benicar to generic 02/2010 for cost, but ineffective Added hctz to ARB due to poor control on ARB alone early 2012 the patient reports compliance with medication(s) as prescribed. Denies adverse side effects. no chest pain, headache or symptoms of dizziness  colon cancer hx - s/p resection July 2010 onc ([facility]) rec no adjuvent tx - rec colo 1 year follow up CT 06/2009 - no evidence of recurrent dz Patient still overdue for repeat colo  Obesity - Prior unintentional loss of weight summer 2010 - related to colo cancer/tx Upward trend reviewed - pt acknowledges need for better food choices and improved exercise -   Past Medical History  Diagnosis Date  . VITAMIN D DEFICIENCY   . HYPERTENSION   . GERD   . URINARY INCONTINENCE   . Malignant neoplasm of cecum 03/28/2009    resection; No chemo/adj tx per onc   Family History  Problem Relation Age of Onset  . Adopted: Yes  . Hypertension Other   . Heart disease Other   . Prostate cancer Other     History  Substance Use Topics  . Smoking status: Never Smoker   . Smokeless tobacco: Not on file   Comment: Retired from [employer]. Lives with daughter at her home  . Alcohol Use: No    Review of Systems  Constitutional: Positive for fatigue and unexpected weight change. Negative for fever.  Respiratory: Negative for cough and shortness of breath.   Musculoskeletal: Negative for back pain, joint swelling and arthralgias.  Cardiovascular: Negative for chest pain or palpitations.  Gastrointestinal: Negative for abdominal pain, no bowel changes.  Skin: Negative for rash.  Neurological: Negative for dizziness or headache.  No other specific complaints in a complete review of systems (except as listed in HPI above).      Objective:   Physical Exam  BP 110/70  Pulse 75  Temp 97.6 F (36.4 C) (Oral)  Ht 5\' 2"  (1.575 m)  Wt 165 lb (74.844 kg)  BMI 30.18 kg/m2  SpO2 99% Wt Readings from Last 3 Encounters:  04/14/12 165 lb (74.844 kg)  09/24/11 158 lb (71.668 kg)  03/26/11 162 lb 12.8 oz (73.846 kg)   Constitutional: She is overweight;. She appears well-developed and well-nourished. No distress.  Neck: Normal range of motion. Neck supple. No JVD present. No thyromegaly present.  Cardiovascular: Normal rate, regular rhythm and normal heart sounds.  No murmur heard. Fatty ankles but no BLE edema. Pulmonary/Chest: Effort normal and breath sounds normal. No respiratory distress. She has no wheezes. Musculoskeletal: B knee - boggy synovitis - min tender to palpation over joint line; FROM  and ligamentous function intact Psychiatric: She has a normal mood and affect. Her behavior is normal. Judgment and thought content normal.   Lab Results  Component Value Date   WBC 5.1 11/08/2009   HGB 11.9 11/08/2009   HCT 34.4* 11/08/2009   PLT 250 11/08/2009   CHOL 184 03/26/2011   TRIG 72.0 03/26/2011   HDL 50.40 03/26/2011   ALT 13 11/08/2009   AST 17 11/08/2009   NA 141  11/08/2009   K 3.7 11/08/2009   CL 106 11/08/2009   CREATININE 0.62 11/08/2009   BUN 11 11/08/2009   CO2 22 11/08/2009   TSH 0.53 03/26/2011      Assessment & Plan:  AWV/v70.0 - Today patient counseled on age appropriate routine health concerns for screening and prevention, each reviewed and up to date or declined. Immunizations reviewed and up to date or declined. Labs/ECG reviewed. Risk factors for depression reviewed and negative. Hearing function and visual acuity are intact. ADLs screened and addressed as needed. Functional ability and level of safety reviewed and appropriate. Education, counseling and referrals performed based on assessed risks today. Patient provided with a copy of personalized plan for preventive services.  Mild Fatigue - nonspecific symptoms/exam - check screening labs  Also see problem list. Medications and labs reviewed today.

## 2012-04-14 NOTE — Assessment & Plan Note (Signed)
Weight changes reviewed - encouraged continued exercise and diet efforts  Also check tsh and chol today  Wt Readings from Last 3 Encounters:  04/14/12 165 lb (74.844 kg)  09/24/11 158 lb (71.668 kg)  03/26/11 162 lb 12.8 oz (73.846 kg)

## 2012-04-14 NOTE — Progress Notes (Signed)
  Subjective:    Patient ID: Darlene Park, female    DOB: June 16, 1946, 66 y.o.   MRN: [id number]  HPI    Review of Systems     Objective:   Physical Exam        Assessment & Plan:

## 2012-04-14 NOTE — Patient Instructions (Addendum)
It was good to see you today. Health Maintenance reviewed - you'll followup for your colonoscopy screening, gynecology visit, and mammogram as we discussed  -flu shot given today - we will consider your tetanus and pneumonia vaccine at a future visit  -all other recommended immunizations and age-appropriate screenings are up-to-date. Test(s) ordered today. Your results will be released to MyChart (or called to you) after review, usually within 72hours after test completion. If any changes need to be made, you will be notified at that same time. Medications reviewed, no changes at this time. Work on lifestyle changes as discussed (low fat, low carb, increased protein diet; improved exercise efforts; weight loss) to control sugar, blood pressure and cholesterol levels and/or reduce risk of developing other medical problems. Look into food journal to assist you in this process. Please schedule followup in 6 months for blood pressure and weight check, call sooner if problems.    Health Maintenance, Females A healthy lifestyle and preventative care can promote health and wellness.  Maintain regular health, dental, and eye exams.   Eat a healthy diet. Foods like vegetables, fruits, whole grains, low-fat dairy products, and lean protein foods contain the nutrients you need without too many calories. Decrease your intake of foods high in solid fats, added sugars, and salt. Get information about a proper diet from your caregiver, if necessary.   Regular physical exercise is one of the most important things you can do for your health. Most adults should get at least 150 minutes of moderate-intensity exercise (any activity that increases your heart rate and causes you to sweat) each week. In addition, most adults need muscle-strengthening exercises on 2 or more days a week.     Maintain a healthy weight. The body mass index (BMI) is a screening tool to identify possible weight problems. It provides an  estimate of body fat based on height and weight. Your caregiver can help determine your BMI, and can help you achieve or maintain a healthy weight. For adults 20 years and older:   A BMI below 18.5 is considered underweight.   A BMI of 18.5 to 24.9 is normal.   A BMI of 25 to 29.9 is considered overweight.   A BMI of 30 and above is considered obese.   Maintain normal blood lipids and cholesterol by exercising and minimizing your intake of saturated fat. Eat a balanced diet with plenty of fruits and vegetables. Blood tests for lipids and cholesterol should begin at age 17 and be repeated every 5 years. If your lipid or cholesterol levels are high, you are over 50, or you are a high risk for heart disease, you may need your cholesterol levels checked more frequently. Ongoing high lipid and cholesterol levels should be treated with medicines if diet and exercise are not effective.   If you smoke, find out from your caregiver how to quit. If you do not use tobacco, do not start.   If you are pregnant, do not drink alcohol. If you are breastfeeding, be very cautious about drinking alcohol. If you are not pregnant and choose to drink alcohol, do not exceed 1 drink per day. One drink is considered to be 12 ounces (355 mL) of beer, 5 ounces (148 mL) of wine, or 1.5 ounces (44 mL) of liquor.   Avoid use of street drugs. Do not share needles with anyone. Ask for help if you need support or instructions about stopping the use of drugs.   High blood pressure causes heart  disease and increases the risk of stroke. Blood pressure should be checked at least every 1 to 2 years. Ongoing high blood pressure should be treated with medicines, if weight loss and exercise are not effective.   If you are 10 to 68 years old, ask your caregiver if you should take aspirin to prevent strokes.   Diabetes screening involves taking a blood sample to check your fasting blood sugar level. This should be done once every 3  years, after age 56, if you are within normal weight and without risk factors for diabetes. Testing should be considered at a younger age or be carried out more frequently if you are overweight and have at least 1 risk factor for diabetes.   Breast cancer screening is essential preventative care for women. You should practice "breast self-awareness." This means understanding the normal appearance and feel of your breasts and may include breast self-examination. Any changes detected, no matter how small, should be reported to a caregiver. Women in their 62s and 30s should have a clinical breast exam (CBE) by a caregiver as part of a regular health exam every 1 to 3 years. After age 59, women should have a CBE every year. Starting at age 15, women should consider having a mammogram (breast X-ray) every year. Women who have a family history of breast cancer should talk to their caregiver about genetic screening. Women at a high risk of breast cancer should talk to their caregiver about having an MRI and a mammogram every year.   The Pap test is a screening test for cervical cancer. Women should have a Pap test starting at age 61. Between ages 13 and 50, Pap tests should be repeated every 2 years. Beginning at age 36, you should have a Pap test every 3 years as long as the past 3 Pap tests have been normal. If you had a hysterectomy for a problem that was not cancer or a condition that could lead to cancer, then you no longer need Pap tests. If you are between ages 63 and 71, and you have had normal Pap tests going back 10 years, you no longer need Pap tests. If you have had past treatment for cervical cancer or a condition that could lead to cancer, you need Pap tests and screening for cancer for at least 20 years after your treatment. If Pap tests have been discontinued, risk factors (such as a new sexual partner) need to be reassessed to determine if screening should be resumed. Some women have medical problems  that increase the chance of getting cervical cancer. In these cases, your caregiver may recommend more frequent screening and Pap tests.   The human papillomavirus (HPV) test is an additional test that may be used for cervical cancer screening. The HPV test looks for the virus that can cause the cell changes on the cervix. The cells collected during the Pap test can be tested for HPV. The HPV test could be used to screen women aged 72 years and older, and should be used in women of any age who have unclear Pap test results. After the age of 77, women should have HPV testing at the same frequency as a Pap test.   Colorectal cancer can be detected and often prevented. Most routine colorectal cancer screening begins at the age of 104 and continues through age 56. However, your caregiver may recommend screening at an earlier age if you have risk factors for colon cancer. On a yearly basis, your caregiver  may provide home test kits to check for hidden blood in the stool. Use of a small camera at the end of a tube, to directly examine the colon (sigmoidoscopy or colonoscopy), can detect the earliest forms of colorectal cancer. Talk to your caregiver about this at age 45, when routine screening begins. Direct examination of the colon should be repeated every 5 to 10 years through age 55, unless early forms of pre-cancerous polyps or small growths are found.   Hepatitis C blood testing is recommended for all people born from [birth year] through 1965 and any individual with known risks for hepatitis C.   Practice safe sex. Use condoms and avoid high-risk sexual practices to reduce the spread of sexually transmitted infections (STIs). Sexually active women aged 26 and younger should be checked for Chlamydia, which is a common sexually transmitted infection. Older women with new or multiple partners should also be tested for Chlamydia. Testing for other STIs is recommended if you are sexually active and at increased risk.    Osteoporosis is a disease in which the bones lose minerals and strength with aging. This can result in serious bone fractures. The risk of osteoporosis can be identified using a bone density scan. Women ages 55 and over and women at risk for fractures or osteoporosis should discuss screening with their caregivers. Ask your caregiver whether you should be taking a calcium supplement or vitamin D to reduce the rate of osteoporosis.   Menopause can be associated with physical symptoms and risks. Hormone replacement therapy is available to decrease symptoms and risks. You should talk to your caregiver about whether hormone replacement therapy is right for you.   Use sunscreen with a sun protection factor (SPF) of 30 or greater. Apply sunscreen liberally and repeatedly throughout the day. You should seek shade when your shadow is shorter than you. Protect yourself by wearing long sleeves, pants, a wide-brimmed hat, and sunglasses year round, whenever you are outdoors.   Notify your caregiver of new moles or changes in moles, especially if there is a change in shape or color. Also notify your caregiver if a mole is larger than the size of a pencil eraser.   Stay current with your immunizations.  Document Released: 12/22/2010 Document Revised: 08/31/2011 Document Reviewed: 12/22/2010 [facility] Patient Information 2013 [place], [place].

## 2012-04-14 NOTE — Assessment & Plan Note (Signed)
At goal on current therapy - pt prefers separate dosing of these meds at this time but consider combination therapy in future Ok to hold HCTZ if low BP - pt will monitor Check lipids annually and tpatient is asked to make an attempt to improve diet and exercise patterns to aid in medical management of this problem.  BP Readings from Last 3 Encounters:  04/14/12 110/70  09/24/11 104/70  03/26/11 120/72

## 2012-04-15 LAB — LIPID PANEL
Cholesterol: 200 mg/dL (ref 0–200)
HDL: 50.9 mg/dL (ref >39.00)
LDL Cholesterol: 139 mg/dL — ABNORMAL HIGH (ref 0–99)
Total CHOL/HDL Ratio: 4
Triglycerides: 50 mg/dL (ref 0.0–149.0)
VLDL: 10 mg/dL (ref 0.0–40.0)

## 2012-04-15 LAB — HEPATIC FUNCTION PANEL
Alkaline Phosphatase: 81 U/L (ref 39–117)
Bilirubin, Direct: 0.1 mg/dL (ref 0.0–0.3)
Total Protein: 8.1 g/dL (ref 6.0–8.3)

## 2012-04-15 LAB — BASIC METABOLIC PANEL
BUN: 20 mg/dL (ref 6–23)
CO2: 24 mEq/L (ref 19–32)
Chloride: 102 mEq/L (ref 96–112)
Creatinine, Ser: 1 mg/dL (ref 0.4–1.2)
Potassium: 4.2 mEq/L (ref 3.5–5.1)

## 2012-05-24 ENCOUNTER — Ambulatory Visit (AMBULATORY_SURGERY_CENTER): Payer: Medicare Other | Admitting: *Deleted

## 2012-05-24 VITALS — Ht 62.0 in | Wt 170.4 lb

## 2012-05-24 DIAGNOSIS — Z85038 Personal history of other malignant neoplasm of large intestine: Secondary | ICD-10-CM

## 2012-05-24 DIAGNOSIS — Z1211 Encounter for screening for malignant neoplasm of colon: Secondary | ICD-10-CM

## 2012-05-24 MED ORDER — NA SULFATE-K SULFATE-MG SULF 17.5-3.13-1.6 GM/177ML PO SOLN: ORAL | Status: DC

## 2012-05-24 NOTE — Progress Notes (Signed)
No allergies to eggs or soy products 

## 2012-06-07 ENCOUNTER — Ambulatory Visit (AMBULATORY_SURGERY_CENTER): Payer: Medicare Other | Admitting: Gastroenterology

## 2012-06-07 ENCOUNTER — Encounter: Payer: Self-pay | Admitting: Gastroenterology

## 2012-06-07 VITALS — BP 106/53 | HR 71 | Temp 98.3°F | Resp 52 | Ht 62.0 in | Wt 170.0 lb

## 2012-06-07 DIAGNOSIS — Z860100 Personal history of colon polyps, unspecified: Secondary | ICD-10-CM

## 2012-06-07 DIAGNOSIS — D126 Benign neoplasm of colon, unspecified: Secondary | ICD-10-CM

## 2012-06-07 DIAGNOSIS — Z1211 Encounter for screening for malignant neoplasm of colon: Secondary | ICD-10-CM

## 2012-06-07 DIAGNOSIS — C180 Malignant neoplasm of cecum: Secondary | ICD-10-CM

## 2012-06-07 MED ORDER — SODIUM CHLORIDE 0.9 % IV SOLN: 500.0000 mL | INTRAVENOUS | Status: DC

## 2012-06-07 NOTE — Progress Notes (Signed)
Called to room to assist during endoscopic procedure.  Patient ID and intended procedure confirmed with present staff. Received instructions for my participation in the procedure from the performing physician.  

## 2012-06-07 NOTE — Patient Instructions (Addendum)

## 2012-06-07 NOTE — Op Note (Signed)
[facility] [address] [place], [address]   COLONOSCOPY PROCEDURE REPORT  PATIENT: Park, Darlene  MR#: [id number] BIRTHDATE: Dec 17, 1945 , 66  yrs. old GENDER: Female ENDOSCOPIST: Louis Meckel, MD REFERRED [id number] Felicity Coyer, M.D. PROCEDURE DATE:  06/07/2012 PROCEDURE:   Colonoscopy with snare polypectomy and Colonoscopy with cold biopsy polypectomy ASA CLASS:   Class II INDICATIONS:High risk patient with personal history of colon cancer (2010) MEDICATIONS: MAC sedation, administered by CRNA and propofol (Diprivan) 200mg  IV  DESCRIPTION OF PROCEDURE:   After the risks benefits and alternatives of the procedure were thoroughly explained, informed consent was obtained.  A digital rectal exam revealed no abnormalities of the rectum.   The LB [id number]  endoscope was introduced through the anus and advanced to the terminal ileum which was intubated for a short distance. No adverse events experienced.   The quality of the prep was Suprep excellent  The instrument was then slowly withdrawn as the colon was fully examined.      COLON FINDINGS: A sessile 2mm polyp was found in the transverse colon.  A polypectomy was performed with cold forceps.  The resection was complete and the polyp tissue was completely retrieved.   A sessile polyp measuring 3 mm in size was found in the sigmoid colon.  A polypectomy was performed with a cold snare. The resection was complete and the polyp tissue was completely retrieved.   The colon mucosa was otherwise normal.  Retroflexed views revealed no abnormalities. The time to cecum=3 minutes 0 seconds.  Withdrawal time=8 minutes 0 seconds.  The scope was withdrawn and the procedure completed. COMPLICATIONS: There were no complications.  ENDOSCOPIC IMPRESSION: 1.   Sessile polyp was found in the transverse colon; polypectomy was performed with cold forceps 2.   Sessile polyp measuring 3 mm in size was found  in the sigmoid colon; polypectomy was performed with a cold snare 3.   The colon mucosa was otherwise normal  RECOMMENDATIONS: Colonoscopy 5 years   eSigned:  Louis Meckel, MD 06/07/2012 11:13 AM   cc:

## 2012-06-07 NOTE — Progress Notes (Signed)
Pt stable to RR Report to RN 

## 2012-06-07 NOTE — Progress Notes (Signed)
Patient did not experience any of the following events: a burn prior to discharge; a fall within the facility; wrong site/side/patient/procedure/implant event; or a hospital transfer or hospital admission upon discharge from the facility. (G8907) Patient did not have preoperative order for IV antibiotic SSI prophylaxis. (G8918)  

## 2012-06-08 ENCOUNTER — Telehealth: Payer: Self-pay | Admitting: *Deleted

## 2012-06-08 NOTE — Telephone Encounter (Signed)
  Follow up Call-  Call back number 06/07/2012  Post procedure Call Back phone  # [phone]  Permission to leave phone message Yes     Patient questions:  Do you have a fever, pain , or abdominal swelling? no Pain Score  0 *  Have you tolerated food without any problems? yes  Have you been able to return to your normal activities? yes  Do you have any questions about your discharge instructions: Diet   no Medications  no Follow up visit  no  Do you have questions or concerns about your Care? no  Actions: * If pain score is 4 or above: No action needed, pain <4.

## 2012-06-16 ENCOUNTER — Encounter: Payer: Self-pay | Admitting: Gastroenterology

## 2012-10-04 ENCOUNTER — Other Ambulatory Visit: Payer: Self-pay | Admitting: Internal Medicine

## 2012-10-13 ENCOUNTER — Ambulatory Visit: Payer: Medicare Other | Admitting: Internal Medicine

## 2012-10-20 ENCOUNTER — Ambulatory Visit: Payer: Medicare Other | Admitting: Internal Medicine

## 2012-10-27 ENCOUNTER — Ambulatory Visit: Payer: Medicare Other | Admitting: Internal Medicine

## 2012-11-10 ENCOUNTER — Encounter: Payer: Self-pay | Admitting: Internal Medicine

## 2012-11-10 ENCOUNTER — Ambulatory Visit (INDEPENDENT_AMBULATORY_CARE_PROVIDER_SITE_OTHER): Payer: Medicare Other | Admitting: Internal Medicine

## 2012-11-10 VITALS — BP 112/70 | HR 93 | Temp 98.2°F | Wt 178.4 lb

## 2012-11-10 DIAGNOSIS — C180 Malignant neoplasm of cecum: Secondary | ICD-10-CM

## 2012-11-10 DIAGNOSIS — I10 Essential (primary) hypertension: Secondary | ICD-10-CM

## 2012-11-10 DIAGNOSIS — E669 Obesity, unspecified: Secondary | ICD-10-CM

## 2012-11-10 NOTE — Assessment & Plan Note (Signed)
At goal on current therapy - pt prefers separate dosing of these meds at this time but consider combination therapy in future Ok to hold HCTZ if low BP - pt will monitor Check lipids annually  patient is asked to make an attempt to improve diet and exercise patterns to aid in medical management of this problem.  BP Readings from Last 3 Encounters:  11/10/12 112/70  06/07/12 106/53  04/14/12 110/70

## 2012-11-10 NOTE — Assessment & Plan Note (Signed)
colon cancer hx - s/p resection July 2010 onc ([facility]inson) rec no adjuvent tx -  f/u CT 06/2009 - no evidence of recurrent dz 05/2012 colo follow up ok - recommended 37yr follow up

## 2012-11-10 NOTE — Assessment & Plan Note (Signed)
Weight changes reviewed - encouraged continued exercise and diet efforts   Wt Readings from Last 3 Encounters:  11/10/12 178 lb 6.4 oz (80.922 kg)  06/07/12 170 lb (77.111 kg)  05/24/12 170 lb 6.4 oz (77.293 kg)

## 2012-11-10 NOTE — Progress Notes (Signed)
  Subjective:    Patient ID: Darlene Park, female    DOB: [birth year], 67 y.o.   MRN: [id number]  HPI  Here for follow up - reviewed chronic medical issues:  HTN  - home BP log reviewed changed benicar to generic 02/2010 for cost, but ineffective Added hctz to ARB due to poor control on ARB alone early 2012 the patient reports compliance with medication(s) as prescribed. Denies adverse side effects. no chest pain, headache or symptoms of dizziness  colon cancer hx - s/p resection July 2010 onc ([facility]) rec no adjuvent tx -  follow up CT 06/2009 - no evidence of recurrent dz repeat colo 05/2012 ok - f/u 52yr rec  Obesity - Prior unintentional loss of weight summer 2010 - related to colo cancer/tx Upward trend reviewed - pt acknowledges need for better food choices and improved exercise -   Past Medical History  Diagnosis Date  . VITAMIN D DEFICIENCY   . HYPERTENSION   . GERD   . URINARY INCONTINENCE   . Malignant neoplasm of cecum 03/28/2009    resection; No chemo/adj tx per onc    Review of Systems  Constitutional: Positive for fatigue and unexpected weight change. Negative for fever.  Respiratory: Negative for cough and shortness of breath.   Musculoskeletal: Negative for back pain, joint swelling and arthralgias.      Objective:   Physical Exam  BP 112/70  Pulse 93  Temp(Src) 98.2 F (36.8 C) (Oral)  Wt 178 lb 6.4 oz (80.922 kg)  BMI 32.62 kg/m2  SpO2 97% Wt Readings from Last 3 Encounters:  11/10/12 178 lb 6.4 oz (80.922 kg)  06/07/12 170 lb (77.111 kg)  05/24/12 170 lb 6.4 oz (77.293 kg)   Constitutional: She is overweight;. She appears well-developed and well-nourished. No distress.  Neck: Normal range of motion. Neck supple. No JVD present. No thyromegaly present.  Cardiovascular: Normal rate, regular rhythm and normal heart sounds.  No murmur heard. Fatty ankles but no BLE edema. Pulmonary/Chest: Effort normal and breath sounds normal. No respiratory  distress. She has no wheezes. Psychiatric: She has a normal mood and affect. Her behavior is normal. Judgment and thought content normal.   Lab Results  Component Value Date   WBC 5.2 04/14/2012   HGB 12.2 04/14/2012   HCT 36.8 04/14/2012   PLT 283.0 04/14/2012   CHOL 200 04/14/2012   TRIG 50.0 04/14/2012   HDL 50.90 04/14/2012   ALT 21 04/14/2012   AST 27 04/14/2012   NA 136 04/14/2012   K 4.2 04/14/2012   CL 102 04/14/2012   CREATININE 1.0 04/14/2012   BUN 20 04/14/2012   CO2 24 04/14/2012   TSH 0.80 04/14/2012      Assessment & Plan:   see problem list. Medications and labs reviewed today.  Time spent with pt today 25 minutes, greater than 50% time spent counseling patient on obesity, need for weight loss, opportunities for diet/exercise habit changes and relationship to hypertension and cholesterol - also medication review and review of interval records

## 2012-11-10 NOTE — Patient Instructions (Signed)
It was good to see you today. We have reviewed your prior records including labs and tests today Medications reviewed, no changes at this time - Refill on medication(s) as discussed today. Continue to work on lifestyle changes as discussed (low fat, low carb diet diet; improved exercise efforts; weight loss) to control sugar, blood pressure and cholesterol levels and/or reduce risk of developing other medical problems. Schedule your mammogram as discussed - call if referral needed Please schedule followup in 6 months for blood pressure, cholesterol and weight check + labs, call sooner if problems.  Exercise to Lose Weight Exercise and a healthy diet may help you lose weight. Your doctor may suggest specific exercises. EXERCISE IDEAS AND TIPS  Choose low-cost things you enjoy doing, such as walking, bicycling, or exercising to workout videos.  Take stairs instead of the elevator.  Walk during your lunch break.  Park your car further away from work or school.  Go to a gym or an exercise class.  Start with 5 to 10 minutes of exercise each day. Build up to 30 minutes of exercise 4 to 6 days a week.  Wear shoes with good support and comfortable clothes.  Stretch before and after working out.  Work out until you breathe harder and your heart beats faster.  Drink extra water when you exercise.  Do not do so much that you hurt yourself, feel dizzy, or get very short of breath. Exercises that burn about 150 calories:  Running 1  miles in 15 minutes.  Playing volleyball for 45 to 60 minutes.  Washing and waxing a car for 45 to 60 minutes.  Playing touch football for 45 minutes.  Walking 1  miles in 35 minutes.  Pushing a stroller 1  miles in 30 minutes.  Playing basketball for 30 minutes.  Raking leaves for 30 minutes.  Bicycling 5 miles in 30 minutes.  Walking 2 miles in 30 minutes.  Dancing for 30 minutes.  Shoveling snow for 15 minutes.  Swimming laps for 20  minutes.  Walking up stairs for 15 minutes.  Bicycling 4 miles in 15 minutes.  Gardening for 30 to 45 minutes.  Jumping rope for 15 minutes.  Washing windows or floors for 45 to 60 minutes. Document Released: 07/11/2010 Document Revised: 08/31/2011 Document Reviewed: 07/11/2010 [facility] Patient Information 2014 [place], [place].

## 2012-11-19 ENCOUNTER — Other Ambulatory Visit: Payer: Self-pay | Admitting: Internal Medicine

## 2013-05-11 ENCOUNTER — Ambulatory Visit: Payer: Medicare Other | Admitting: Internal Medicine

## 2013-05-25 ENCOUNTER — Ambulatory Visit: Payer: Medicare Other | Admitting: Internal Medicine

## 2013-06-13 ENCOUNTER — Encounter: Payer: Self-pay | Admitting: Internal Medicine

## 2013-06-13 ENCOUNTER — Other Ambulatory Visit (INDEPENDENT_AMBULATORY_CARE_PROVIDER_SITE_OTHER): Payer: Medicare Other

## 2013-06-13 ENCOUNTER — Ambulatory Visit (INDEPENDENT_AMBULATORY_CARE_PROVIDER_SITE_OTHER): Payer: Medicare Other | Admitting: Internal Medicine

## 2013-06-13 VITALS — BP 120/72 | HR 84 | Temp 98.1°F | Wt 177.1 lb

## 2013-06-13 DIAGNOSIS — I10 Essential (primary) hypertension: Secondary | ICD-10-CM

## 2013-06-13 DIAGNOSIS — M1710 Unilateral primary osteoarthritis, unspecified knee: Secondary | ICD-10-CM

## 2013-06-13 DIAGNOSIS — R5383 Other fatigue: Secondary | ICD-10-CM

## 2013-06-13 DIAGNOSIS — R5381 Other malaise: Secondary | ICD-10-CM

## 2013-06-13 DIAGNOSIS — Z0000 Encounter for general adult medical examination without abnormal findings: Secondary | ICD-10-CM

## 2013-06-13 DIAGNOSIS — M1712 Unilateral primary osteoarthritis, left knee: Secondary | ICD-10-CM | POA: Insufficient documentation

## 2013-06-13 DIAGNOSIS — Z23 Encounter for immunization: Secondary | ICD-10-CM

## 2013-06-13 DIAGNOSIS — E669 Obesity, unspecified: Secondary | ICD-10-CM

## 2013-06-13 LAB — BASIC METABOLIC PANEL
BUN: 11 mg/dL (ref 6–23)
Chloride: 105 mEq/L (ref 96–112)
GFR: 78.88 mL/min (ref >60.00)
Glucose, Bld: 79 mg/dL (ref 70–99)
Potassium: 3.9 mEq/L (ref 3.5–5.1)
Sodium: 138 mEq/L (ref 135–145)

## 2013-06-13 LAB — CBC WITH DIFFERENTIAL/PLATELET
Basophils Absolute: 0 10*3/uL (ref 0.0–0.1)
Eosinophils Relative: 6 % — ABNORMAL HIGH (ref 0.0–5.0)
HCT: 34.5 % — ABNORMAL LOW (ref 36.0–46.0)
Hemoglobin: 11.6 g/dL — ABNORMAL LOW (ref 12.0–15.0)
Lymphocytes Relative: 26.9 % (ref 12.0–46.0)
Lymphs Abs: 1.7 10*3/uL (ref 0.7–4.0)
MCV: 88.1 fl (ref 78.0–100.0)
Monocytes Absolute: 0.4 10*3/uL (ref 0.1–1.0)
Monocytes Relative: 6 % (ref 3.0–12.0)
Neutro Abs: 3.9 10*3/uL (ref 1.4–7.7)
RBC: 3.92 Mil/uL (ref 3.87–5.11)
RDW: 13.7 % (ref 11.5–14.6)
WBC: 6.5 10*3/uL (ref 4.5–10.5)

## 2013-06-13 LAB — LIPID PANEL
HDL: 47 mg/dL (ref >39.00)
Total CHOL/HDL Ratio: 4
Triglycerides: 72 mg/dL (ref 0.0–149.0)
VLDL: 14.4 mg/dL (ref 0.0–40.0)

## 2013-06-13 LAB — HEPATIC FUNCTION PANEL
ALT: 21 U/L (ref 0–35)
Albumin: 4.2 g/dL (ref 3.5–5.2)
Bilirubin, Direct: 0 mg/dL (ref 0.0–0.3)
Total Bilirubin: 0.3 mg/dL (ref 0.3–1.2)

## 2013-06-13 MED ORDER — HYDROCHLOROTHIAZIDE 25 MG PO TABS: ORAL_TABLET | ORAL | Status: DC

## 2013-06-13 MED ORDER — DICLOFENAC SODIUM 1 % TD GEL: 2.0000 g | Freq: Three times a day (TID) | TRANSDERMAL | Status: DC | PRN

## 2013-06-13 MED ORDER — OLMESARTAN MEDOXOMIL 20 MG PO TABS: ORAL_TABLET | ORAL | Status: DC

## 2013-06-13 NOTE — Progress Notes (Signed)
Subjective:    Patient ID: Darlene Park, female    DOB: 12-May-1946, 67 y.o.   MRN: [id number]  HPI   Here for medicare wellness  Diet: heart healthy Physical activity: sedentary Depression/mood screen: negative Hearing: intact to whispered voice Visual acuity: grossly normal, performs annual eye exam  ADLs: capable Fall risk: none Home safety: good Cognitive evaluation: intact to orientation, naming, recall and repetition EOL planning: adv directives, full code/ I agree  I have personally reviewed and have noted 1. The patient's medical and social history 2. Their use of alcohol, tobacco or illicit drugs 3. Their current medications and supplements 4. The patient's functional ability including ADL's, fall risks, home safety risks and hearing or visual impairment. 5. Diet and physical activities 6. Evidence for depression or mood disorders  Also reviewed chronic medical issues and interval medical events:  HTN  -Added hctz to ARB due to poor control on ARB alone early 2012. the patient reports compliance with medication(s) as prescribed. Denies adverse side effects. no chest pain, headache or symptoms of dizziness  colon cancer hx - s/p resection July 2010 onc ([facility]) recommended no adjunctive tx -  follow up CT 06/2009 - no evidence of recurrent dz repeat colo 05/2012 ok -  91yr follow up recommended   Obesity - Prior unintentional loss of weight summer 2010 related to colon cancer/tx. Upward trend reviewed - pt acknowledges need for better food choices and improved exercise -   Past Medical History  Diagnosis Date  . VITAMIN D DEFICIENCY   . HYPERTENSION   . GERD   . URINARY INCONTINENCE   . Malignant neoplasm of cecum 03/28/2009    resection; No chemo/adj tx per onc   Family History  Problem Relation Age of Onset  . Adopted: Yes  . Hypertension Other   . Heart disease Other   . Prostate cancer Other   . Colon cancer Neg Hx   . Esophageal cancer Neg Hx    . Rectal cancer Neg Hx   . Stomach cancer Neg Hx    History  Substance Use Topics  . Smoking status: Never Smoker   . Smokeless tobacco: Never Used     Comment: Retired from [employer]. Lives with daughter at her home  . Alcohol Use: Yes     Comment: socially    Review of Systems  Constitutional: Positive for fatigue. Negative for unexpected weight change.  Respiratory: Negative for cough, shortness of breath and wheezing.   Cardiovascular: Negative for chest pain, palpitations and leg swelling.  Gastrointestinal: Negative for nausea, abdominal pain and diarrhea.  Musculoskeletal: Positive for arthralgias. Negative for joint swelling.  Neurological: Negative for dizziness, weakness, light-headedness and headaches.  Psychiatric/Behavioral: Negative for dysphoric mood. The patient is not nervous/anxious.   All other systems reviewed and are negative.       Objective:   Physical Exam BP 120/72  Pulse 84  Temp(Src) 98.1 F (36.7 C) (Oral)  Wt 177 lb 1.9 oz (80.341 kg)  SpO2 99% Wt Readings from Last 3 Encounters:  06/13/13 177 lb 1.9 oz (80.341 kg)  11/10/12 178 lb 6.4 oz (80.922 kg)  06/07/12 170 lb (77.111 kg)   Constitutional: She is overweight, but appears well-developed and well-nourished. No distress.  HENT: Head: Normocephalic and atraumatic. Ears: B TMs ok, no erythema or effusion; Nose: Nose normal. Mouth/Throat: Oropharynx is clear and moist. No oropharyngeal exudate.  Eyes: Conjunctivae and EOM are normal. Pupils are equal, round, and reactive to light.  No scleral icterus.  Neck: Normal range of motion. Neck supple. No JVD present. No thyromegaly present.  Cardiovascular: Normal rate, regular rhythm and normal heart sounds.  No murmur heard. No BLE edema. Pulmonary/Chest: Effort normal and breath sounds normal. No respiratory distress. She has no wheezes.  Abdominal: Soft. Bowel sounds are normal. She exhibits no distension. There is no tenderness. no  masses Musculoskeletal: L knee - boggy synovitis - tender to palpation over joint line; FROM and ligamentous function intact. Otherwise Normal range of motion, no joint effusions. No gross deformities Neurological: She is alert and oriented to person, place, and time. No cranial nerve deficit. Coordination, balance, strength, speech and gait are normal.  Skin: Skin is warm and dry. No rash noted. No erythema.  Psychiatric: She has a normal mood and affect. Her behavior is normal. Judgment and thought content normal.   Lab Results  Component Value Date   WBC 5.2 04/14/2012   HGB 12.2 04/14/2012   HCT 36.8 04/14/2012   PLT 283.0 04/14/2012   GLUCOSE 76 04/14/2012   CHOL 200 04/14/2012   TRIG 50.0 04/14/2012   HDL 50.90 04/14/2012   LDLCALC 139* 04/14/2012   ALT 21 04/14/2012   AST 27 04/14/2012   NA 136 04/14/2012   K 4.2 04/14/2012   CL 102 04/14/2012   CREATININE 1.0 04/14/2012   BUN 20 04/14/2012   CO2 24 04/14/2012   TSH 0.80 04/14/2012       Assessment & Plan:    AWV/v70.0 - Today patient counseled on age appropriate routine health concerns for screening and prevention, each reviewed and up to date or declined. Immunizations reviewed and up to date or declined. Labs ordered and reviewed. Risk factors for depression reviewed and negative. Hearing function and visual acuity are intact. ADLs screened and addressed as needed. Functional ability and level of safety reviewed and appropriate. Education, counseling and referrals performed based on assessed risks today. Patient provided with a copy of personalized plan for preventive services.  Fatigue - nonspecific symptoms/exam - check screening labs  Also See problem list. Medications and labs reviewed today.

## 2013-06-13 NOTE — Progress Notes (Signed)
Pre-visit discussion using our clinic review tool. No additional management support is needed unless otherwise documented below in the visit note.  

## 2013-06-13 NOTE — Assessment & Plan Note (Signed)
Weight changes reviewed - encouraged continued exercise and diet efforts   Wt Readings from Last 3 Encounters:  06/13/13 177 lb 1.9 oz (80.341 kg)  11/10/12 178 lb 6.4 oz (80.922 kg)  06/07/12 170 lb (77.111 kg)

## 2013-06-13 NOTE — Assessment & Plan Note (Signed)
No joint swelling or prior ortho eval Used friend's voltaren gel with good relief - requets rx Will send erx now - If declined, will ty oral diclofenac prn Pt understands same and agrees

## 2013-06-13 NOTE — Patient Instructions (Addendum)
It was good to see you today.  We have reviewed your prior records including labs and tests today  Health Maintenance reviewed - all recommended immunizations and age-appropriate screenings are up-to-date. Your annual flu shot was given and/or updated today. Consider pneumonia vaccine at next visit and future shingles vaccination if okay with your supplemental insurance  Test(s) ordered today. Your results will be released to MyChart (or called to you) after review, usually within 72hours after test completion. If any changes need to be made, you will be notified at that same time.  Medications reviewed and updated, no changes recommended at this time. Okay to try Voltaren (gel or pill) for knee arthritis pain as needed Your prescription(s) have been submitted to your pharmacy. Please take as directed and contact our office if you believe you are having problem(s) with the medication(s).  Please schedule followup in 6 months for recheck, call sooner if problems.  Health Maintenance, Female A healthy lifestyle and preventative care can promote health and wellness.  Maintain regular health, dental, and eye exams.  Eat a healthy diet. Foods like vegetables, fruits, whole grains, low-fat dairy products, and lean protein foods contain the nutrients you need without too many calories. Decrease your intake of foods high in solid fats, added sugars, and salt. Get information about a proper diet from your caregiver, if necessary.  Regular physical exercise is one of the most important things you can do for your health. Most adults should get at least 150 minutes of moderate-intensity exercise (any activity that increases your heart rate and causes you to sweat) each week. In addition, most adults need muscle-strengthening exercises on 2 or more days a week.   Maintain a healthy weight. The body mass index (BMI) is a screening tool to identify possible weight problems. It provides an estimate of body  fat based on height and weight. Your caregiver can help determine your BMI, and can help you achieve or maintain a healthy weight. For adults 20 years and older:  A BMI below 18.5 is considered underweight.  A BMI of 18.5 to 24.9 is normal.  A BMI of 25 to 29.9 is considered overweight.  A BMI of 30 and above is considered obese.  Maintain normal blood lipids and cholesterol by exercising and minimizing your intake of saturated fat. Eat a balanced diet with plenty of fruits and vegetables. Blood tests for lipids and cholesterol should begin at age 61 and be repeated every 5 years. If your lipid or cholesterol levels are high, you are over 50, or you are a high risk for heart disease, you may need your cholesterol levels checked more frequently.Ongoing high lipid and cholesterol levels should be treated with medicines if diet and exercise are not effective.  If you smoke, find out from your caregiver how to quit. If you do not use tobacco, do not start.  Lung cancer screening is recommended for adults aged 48 80 years who are at high risk for developing lung cancer because of a history of smoking. Yearly low-dose computed tomography (CT) is recommended for people who have at least a 30-pack-year history of smoking and are a current smoker or have quit within the past 15 years. A pack year of smoking is smoking an average of 1 pack of cigarettes a day for 1 year (for example: 1 pack a day for 30 years or 2 packs a day for 15 years). Yearly screening should continue until the smoker has stopped smoking for at least 15  years. Yearly screening should also be stopped for people who develop a health problem that would prevent them from having lung cancer treatment.  If you are pregnant, do not drink alcohol. If you are breastfeeding, be very cautious about drinking alcohol. If you are not pregnant and choose to drink alcohol, do not exceed 1 drink per day. One drink is considered to be 12 ounces (355 mL)  of beer, 5 ounces (148 mL) of wine, or 1.5 ounces (44 mL) of liquor.  Avoid use of street drugs. Do not share needles with anyone. Ask for help if you need support or instructions about stopping the use of drugs.  High blood pressure causes heart disease and increases the risk of stroke. Blood pressure should be checked at least every 1 to 2 years. Ongoing high blood pressure should be treated with medicines, if weight loss and exercise are not effective.  If you are 94 to 69 years old, ask your caregiver if you should take aspirin to prevent strokes.  Diabetes screening involves taking a blood sample to check your fasting blood sugar level. This should be done once every 3 years, after age 69, if you are within normal weight and without risk factors for diabetes. Testing should be considered at a younger age or be carried out more frequently if you are overweight and have at least 1 risk factor for diabetes.  Breast cancer screening is essential preventative care for women. You should practice "breast self-awareness." This means understanding the normal appearance and feel of your breasts and may include breast self-examination. Any changes detected, no matter how small, should be reported to a caregiver. Women in their 54s and 30s should have a clinical breast exam (CBE) by a caregiver as part of a regular health exam every 1 to 3 years. After age 56, women should have a CBE every year. Starting at age 60, women should consider having a mammogram (breast X-ray) every year. Women who have a family history of breast cancer should talk to their caregiver about genetic screening. Women at a high risk of breast cancer should talk to their caregiver about having an MRI and a mammogram every year.  Breast cancer gene (BRCA)-related cancer risk assessment is recommended for women who have family members with BRCA-related cancers. BRCA-related cancers include breast, ovarian, tubal, and peritoneal cancers.  Having family members with these cancers may be associated with an increased risk for harmful changes (mutations) in the breast cancer genes BRCA1 and BRCA2. Results of the assessment will determine the need for genetic counseling and BRCA1 and BRCA2 testing.  The Pap test is a screening test for cervical cancer. Women should have a Pap test starting at age 41. Between ages 87 and 23, Pap tests should be repeated every 2 years. Beginning at age 22, you should have a Pap test every 3 years as long as the past 3 Pap tests have been normal. If you had a hysterectomy for a problem that was not cancer or a condition that could lead to cancer, then you no longer need Pap tests. If you are between ages 24 and 54, and you have had normal Pap tests going back 10 years, you no longer need Pap tests. If you have had past treatment for cervical cancer or a condition that could lead to cancer, you need Pap tests and screening for cancer for at least 20 years after your treatment. If Pap tests have been discontinued, risk factors (such as a new sexual  partner) need to be reassessed to determine if screening should be resumed. Some women have medical problems that increase the chance of getting cervical cancer. In these cases, your caregiver may recommend more frequent screening and Pap tests.  The human papillomavirus (HPV) test is an additional test that may be used for cervical cancer screening. The HPV test looks for the virus that can cause the cell changes on the cervix. The cells collected during the Pap test can be tested for HPV. The HPV test could be used to screen women aged 72 years and older, and should be used in women of any age who have unclear Pap test results. After the age of 46, women should have HPV testing at the same frequency as a Pap test.  Colorectal cancer can be detected and often prevented. Most routine colorectal cancer screening begins at the age of 103 and continues through age 61. However,  your caregiver may recommend screening at an earlier age if you have risk factors for colon cancer. On a yearly basis, your caregiver may provide home test kits to check for hidden blood in the stool. Use of a small camera at the end of a tube, to directly examine the colon (sigmoidoscopy or colonoscopy), can detect the earliest forms of colorectal cancer. Talk to your caregiver about this at age 49, when routine screening begins. Direct examination of the colon should be repeated every 5 to 10 years through age 58, unless early forms of pre-cancerous polyps or small growths are found.  Hepatitis C blood testing is recommended for all people born from [birth year] through 1967 and any individual with known risks for hepatitis C.  Practice safe sex. Use condoms and avoid high-risk sexual practices to reduce the spread of sexually transmitted infections (STIs). Sexually active women aged 17 and younger should be checked for Chlamydia, which is a common sexually transmitted infection. Older women with new or multiple partners should also be tested for Chlamydia. Testing for other STIs is recommended if you are sexually active and at increased risk.  Osteoporosis is a disease in which the bones lose minerals and strength with aging. This can result in serious bone fractures. The risk of osteoporosis can be identified using a bone density scan. Women ages 59 and over and women at risk for fractures or osteoporosis should discuss screening with their caregivers. Ask your caregiver whether you should be taking a calcium supplement or vitamin D to reduce the rate of osteoporosis.  Menopause can be associated with physical symptoms and risks. Hormone replacement therapy is available to decrease symptoms and risks. You should talk to your caregiver about whether hormone replacement therapy is right for you.  Use sunscreen. Apply sunscreen liberally and repeatedly throughout the day. You should seek shade when your shadow is  shorter than you. Protect yourself by wearing long sleeves, pants, a wide-brimmed hat, and sunglasses year round, whenever you are outdoors.  Notify your caregiver of new moles or changes in moles, especially if there is a change in shape or color. Also notify your caregiver if a mole is larger than the size of a pencil eraser.  Stay current with your immunizations. Document Released: 12/22/2010 Document Revised: 10/03/2012 Document Reviewed: 12/22/2010 [facility] Patient Information 2014 [employer], [place]. Hypertension Hypertension is another name for high blood pressure. High blood pressure may mean that your heart needs to work harder to pump blood. Blood pressure consists of two numbers, which includes a higher number over a lower number (example: 110/72).  HOME CARE   Make lifestyle changes as told by your doctor. This may include weight loss and exercise.  Take your blood pressure medicine every day.  Limit how much salt you use.  Stop smoking if you smoke.  Do not use drugs.  Talk to your doctor if you are using decongestants or birth control pills. These medicines might make blood pressure higher.  Females should not drink more than 1 alcoholic drink per day. Males should not drink more than 2 alcoholic drinks per day.  See your doctor as told. GET HELP RIGHT AWAY IF:   You have a blood pressure reading with a top number of 180 or higher.  You get a very bad headache.  You get blurred or changing vision.  You feel confused.  You feel weak, numb, or faint.  You get chest or belly (abdominal) pain.  You throw up (vomit).  You cannot breathe very well. MAKE SURE YOU:   Understand these instructions.  Will watch your condition.  Will get help right away if you are not doing well or get worse. Document Released: 11/25/2007 Document Revised: 08/31/2011 Document Reviewed: 11/25/2007 [facility] Patient Information 2014 [place], [place].

## 2013-06-13 NOTE — Assessment & Plan Note (Signed)
At goal on current therapy - pt prefers separate dosing of these meds at this time but consider combination therapy in future Ok to hold HCTZ if low BP - pt will monitor Check lipids annually  patient is asked to make an attempt to improve diet and exercise patterns to aid in medical management of this problem.  BP Readings from Last 3 Encounters:  06/13/13 120/72  11/10/12 112/70  06/07/12 106/53

## 2013-12-05 ENCOUNTER — Other Ambulatory Visit: Payer: Self-pay | Admitting: Internal Medicine

## 2013-12-12 ENCOUNTER — Ambulatory Visit: Payer: Medicare Other | Admitting: Internal Medicine

## 2013-12-20 ENCOUNTER — Ambulatory Visit: Payer: Medicare Other | Admitting: Internal Medicine

## 2014-01-07 ENCOUNTER — Other Ambulatory Visit: Payer: Self-pay | Admitting: Internal Medicine

## 2014-01-10 ENCOUNTER — Encounter: Payer: Self-pay | Admitting: Internal Medicine

## 2014-01-10 ENCOUNTER — Ambulatory Visit (INDEPENDENT_AMBULATORY_CARE_PROVIDER_SITE_OTHER): Payer: 59 | Admitting: Internal Medicine

## 2014-01-10 VITALS — BP 130/72 | HR 74 | Temp 98.2°F | Ht 62.0 in | Wt 177.0 lb

## 2014-01-10 DIAGNOSIS — I10 Essential (primary) hypertension: Secondary | ICD-10-CM

## 2014-01-10 DIAGNOSIS — E669 Obesity, unspecified: Secondary | ICD-10-CM

## 2014-01-10 DIAGNOSIS — Z23 Encounter for immunization: Secondary | ICD-10-CM

## 2014-01-10 NOTE — Assessment & Plan Note (Signed)
Weight changes reviewed - encouraged continued exercise and diet efforts   Wt Readings from Last 3 Encounters:  01/10/14 177 lb (80.287 kg)  06/13/13 177 lb 1.9 oz (80.341 kg)  11/10/12 178 lb 6.4 oz (80.922 kg)

## 2014-01-10 NOTE — Assessment & Plan Note (Signed)
At goal on current therapy - pt prefers separate dosing of these meds at this time but consider combination therapy in future Ok to hold HCTZ if low BP - pt will monitor Check lipids annually  patient is asked to make an attempt to improve diet and exercise patterns to aid in medical management of this problem.  BP Readings from Last 3 Encounters:  01/10/14 130/72  06/13/13 120/72  11/10/12 112/70

## 2014-01-10 NOTE — Patient Instructions (Addendum)
It was good to see you today.  We have reviewed your prior records including labs and tests today  Medications reviewed and updated, no changes recommended at this time.  Immunizations updated today include tetanus and pneumonia vaccine  Please schedule followup in 6-12 months for annual exam/labs, call sooner if problems.  Hypertension Hypertension, commonly called high blood pressure, is when the force of blood pumping through your arteries is too strong. Your arteries are the blood vessels that carry blood from your heart throughout your body. A blood pressure reading consists of a higher number over a lower number, such as 110/72. The higher number (systolic) is the pressure inside your arteries when your heart pumps. The lower number (diastolic) is the pressure inside your arteries when your heart relaxes. Ideally you want your blood pressure below 120/80. Hypertension forces your heart to work harder to pump blood. Your arteries may become narrow or stiff. Having hypertension puts you at risk for heart disease, stroke, and other problems.  RISK FACTORS Some risk factors for high blood pressure are controllable. Others are not.  Risk factors you cannot control include:   Race. You may be at higher risk if you are African American.  Age. Risk increases with age.  Gender. Men are at higher risk than women before age 23 years. After age 48, women are at higher risk than men. Risk factors you can control include:  Not getting enough exercise or physical activity.  Being overweight.  Getting too much fat, sugar, calories, or salt in your diet.  Drinking too much alcohol. SIGNS AND SYMPTOMS Hypertension does not usually cause signs or symptoms. Extremely high blood pressure (hypertensive crisis) may cause headache, anxiety, shortness of breath, and nosebleed. DIAGNOSIS  To check if you have hypertension, your health care provider will measure your blood pressure while you are seated,  with your arm held at the level of your heart. It should be measured at least twice using the same arm. Certain conditions can cause a difference in blood pressure between your right and left arms. A blood pressure reading that is higher than normal on one occasion does not mean that you need treatment. If one blood pressure reading is high, ask your health care provider about having it checked again. TREATMENT  Treating high blood pressure includes making lifestyle changes and possibly taking medication. Living a healthy lifestyle can help lower high blood pressure. You may need to change some of your habits. Lifestyle changes may include:  Following the DASH diet. This diet is high in fruits, vegetables, and whole grains. It is low in salt, red meat, and added sugars.  Getting at least 2 1/2 hours of brisk physical activity every week.  Losing weight if necessary.  Not smoking.  Limiting alcoholic beverages.  Learning ways to reduce stress. If lifestyle changes are not enough to get your blood pressure under control, your health care provider may prescribe medicine. You may need to take more than one. Work closely with your health care provider to understand the risks and benefits. HOME CARE INSTRUCTIONS  Have your blood pressure rechecked as directed by your health care provider.   Only take medicine as directed by your health care provider. Follow the directions carefully. Blood pressure medicines must be taken as prescribed. The medicine does not work as well when you skip doses. Skipping doses also puts you at risk for problems.   Do not smoke.   Monitor your blood pressure at home as directed by  your health care provider. SEEK MEDICAL CARE IF:   You think you are having a reaction to medicines taken.  You have recurrent headaches or feel dizzy.  You have swelling in your ankles.  You have trouble with your vision. SEEK IMMEDIATE MEDICAL CARE IF:  You develop a severe  headache or confusion.  You have unusual weakness, numbness, or feel faint.  You have severe chest or abdominal pain.  You vomit repeatedly.  You have trouble breathing. MAKE SURE YOU:   Understand these instructions.  Will watch your condition.  Will get help right away if you are not doing well or get worse. Document Released: 06/08/2005 Document Revised: 06/13/2013 Document Reviewed: 03/31/2013 [facility] Patient Information 2015 [place], [place]. This information is not intended to replace advice given to you by your health care provider. Make sure you discuss any questions you have with your health care provider.

## 2014-01-10 NOTE — Progress Notes (Signed)
Pre visit review using our clinic review tool, if applicable. No additional management support is needed unless otherwise documented below in the visit note. 

## 2014-01-10 NOTE — Progress Notes (Signed)
   Subjective:    Patient ID: Darlene Park, female    DOB: 17-Jun-1946, 68 y.o.   MRN: [id number]  HPI  Patient is here for follow up  Reviewed chronic medical issues and interval medical events  Past Medical History  Diagnosis Date  . VITAMIN D DEFICIENCY   . HYPERTENSION   . GERD   . URINARY INCONTINENCE   . Malignant neoplasm of cecum 03/28/2009    resection; No chemo/adj tx per onc  . Osteoarthritis of left knee     Review of Systems  Constitutional: Negative for fatigue and unexpected weight change.  Respiratory: Negative for cough and shortness of breath.   Cardiovascular: Negative for chest pain and leg swelling.       Objective:   Physical Exam  BP 130/72  Pulse 74  Temp(Src) 98.2 F (36.8 C) (Oral)  Ht 5\' 2"  (1.575 m)  Wt 177 lb (80.287 kg)  BMI 32.37 kg/m2  SpO2 99% Wt Readings from Last 3 Encounters:  01/10/14 177 lb (80.287 kg)  06/13/13 177 lb 1.9 oz (80.341 kg)  11/10/12 178 lb 6.4 oz (80.922 kg)   Constitutional: She is overweight, but appears well-developed and well-nourished. No distress.  Neck: Normal range of motion. Neck supple. No JVD present. No thyromegaly present.  Cardiovascular: Normal rate, regular rhythm and normal heart sounds.  No murmur heard. "fatty ankles" , but no BLE edema. Pulmonary/Chest: Effort normal and breath sounds normal. No respiratory distress. She has no wheezes.  Psychiatric: She has a normal mood and affect. Her behavior is normal. Judgment and thought content normal.   Lab Results  Component Value Date   WBC 6.5 06/13/2013   HGB 11.6* 06/13/2013   HCT 34.5* 06/13/2013   PLT 282.0 06/13/2013   GLUCOSE 79 06/13/2013   CHOL 176 06/13/2013   TRIG 72.0 06/13/2013   HDL 47.00 06/13/2013   LDLCALC 115* 06/13/2013   ALT 21 06/13/2013   AST 20 06/13/2013   NA 138 06/13/2013   K 3.9 06/13/2013   CL 105 06/13/2013   CREATININE 0.8 06/13/2013   BUN 11 06/13/2013   CO2 26 06/13/2013   TSH 0.79 06/13/2013     No results found.     Assessment & Plan:   Problem List Items Addressed This Visit   HYPERTENSION - Primary      At goal on current therapy - pt prefers separate dosing of these meds at this time but consider combination therapy in future Ok to hold HCTZ if low BP - pt will monitor Check lipids annually  patient is asked to make an attempt to improve diet and exercise patterns to aid in medical management of this problem.  BP Readings from Last 3 Encounters:  01/10/14 130/72  06/13/13 120/72  11/10/12 112/70      Obese      Weight changes reviewed - encouraged continued exercise and diet efforts   Wt Readings from Last 3 Encounters:  01/10/14 177 lb (80.287 kg)  06/13/13 177 lb 1.9 oz (80.341 kg)  11/10/12 178 lb 6.4 oz (80.922 kg)

## 2014-01-11 ENCOUNTER — Telehealth: Payer: Self-pay | Admitting: Internal Medicine

## 2014-01-11 NOTE — Telephone Encounter (Signed)
Relevant patient education mailed to patient.  

## 2014-03-21 ENCOUNTER — Encounter: Payer: Self-pay | Admitting: Gastroenterology

## 2014-04-20 ENCOUNTER — Other Ambulatory Visit: Payer: Self-pay | Admitting: Internal Medicine

## 2014-06-21 ENCOUNTER — Telehealth: Payer: Self-pay

## 2014-06-21 NOTE — Telephone Encounter (Signed)
Spoke to pt about if they had a mammogram for 2015 Pt stated that they did not.

## 2014-07-06 ENCOUNTER — Other Ambulatory Visit: Payer: Self-pay | Admitting: Internal Medicine

## 2014-07-18 ENCOUNTER — Encounter: Payer: Medicare Other | Admitting: Internal Medicine

## 2014-08-02 ENCOUNTER — Other Ambulatory Visit: Payer: Self-pay | Admitting: Internal Medicine

## 2014-11-05 ENCOUNTER — Other Ambulatory Visit (INDEPENDENT_AMBULATORY_CARE_PROVIDER_SITE_OTHER): Payer: Medicare Other

## 2014-11-05 ENCOUNTER — Encounter: Payer: Self-pay | Admitting: Internal Medicine

## 2014-11-05 ENCOUNTER — Ambulatory Visit (INDEPENDENT_AMBULATORY_CARE_PROVIDER_SITE_OTHER): Payer: Medicare Other | Admitting: Internal Medicine

## 2014-11-05 VITALS — BP 122/78 | HR 78 | Temp 98.0°F | Ht 62.0 in | Wt 186.0 lb

## 2014-11-05 DIAGNOSIS — I10 Essential (primary) hypertension: Secondary | ICD-10-CM

## 2014-11-05 DIAGNOSIS — Z0000 Encounter for general adult medical examination without abnormal findings: Secondary | ICD-10-CM | POA: Diagnosis not present

## 2014-11-05 DIAGNOSIS — E559 Vitamin D deficiency, unspecified: Secondary | ICD-10-CM

## 2014-11-05 DIAGNOSIS — Z1159 Encounter for screening for other viral diseases: Secondary | ICD-10-CM | POA: Diagnosis not present

## 2014-11-05 DIAGNOSIS — Z23 Encounter for immunization: Secondary | ICD-10-CM

## 2014-11-05 DIAGNOSIS — E669 Obesity, unspecified: Secondary | ICD-10-CM | POA: Diagnosis not present

## 2014-11-05 DIAGNOSIS — Z1239 Encounter for other screening for malignant neoplasm of breast: Secondary | ICD-10-CM

## 2014-11-05 LAB — VITAMIN D 25 HYDROXY (VIT D DEFICIENCY, FRACTURES): VITD: 33.48 ng/mL (ref 30.00–100.00)

## 2014-11-05 LAB — BASIC METABOLIC PANEL
BUN: 22 mg/dL (ref 6–23)
CHLORIDE: 101 meq/L (ref 96–112)
CO2: 26 meq/L (ref 19–32)
Calcium: 9.7 mg/dL (ref 8.4–10.5)
Creatinine, Ser: 1.17 mg/dL (ref 0.40–1.20)
GFR: 48.48 mL/min — ABNORMAL LOW (ref >60.00)
Glucose, Bld: 90 mg/dL (ref 70–99)
Potassium: 3.9 mEq/L (ref 3.5–5.1)
Sodium: 135 mEq/L (ref 135–145)

## 2014-11-05 LAB — LIPID PANEL
CHOLESTEROL: 173 mg/dL (ref 0–200)
HDL: 49.5 mg/dL (ref >39.00)
LDL CALC: 105 mg/dL — AB (ref 0–99)
NonHDL: 123.5
Total CHOL/HDL Ratio: 3
Triglycerides: 93 mg/dL (ref 0.0–149.0)
VLDL: 18.6 mg/dL (ref 0.0–40.0)

## 2014-11-05 MED ORDER — DICLOFENAC SODIUM 1 % TD GEL
2.0000 g | Freq: Three times a day (TID) | TRANSDERMAL | Status: DC | PRN
Start: 2014-11-05 — End: 2015-10-23

## 2014-11-05 MED ORDER — OLMESARTAN MEDOXOMIL 20 MG PO TABS: 20.0000 mg | ORAL_TABLET | Freq: Every day | ORAL | Status: DC

## 2014-11-05 MED ORDER — HYDROCHLOROTHIAZIDE 25 MG PO TABS: 25.0000 mg | ORAL_TABLET | Freq: Every day | ORAL | Status: DC

## 2014-11-05 NOTE — Assessment & Plan Note (Signed)
Weight changes reviewed - encouraged continued exercise and diet efforts   Wt Readings from Last 3 Encounters:  11/05/14 186 lb (84.369 kg)  01/10/14 177 lb (80.287 kg)  06/13/13 177 lb 1.9 oz (80.341 kg)

## 2014-11-05 NOTE — Progress Notes (Signed)
Pre visit review using our clinic review tool, if applicable. No additional management support is needed unless otherwise documented below in the visit note. 

## 2014-11-05 NOTE — Patient Instructions (Addendum)
It was good to see you today.  We have reviewed your prior records including labs and tests today  Health Maintenance reviewed -pneumonia vaccine updated today. Will also help schedule your mammogram and bone density at [facility] as discussed. All other recommended immunizations and age-appropriate screenings are up-to-date.  Test(s) ordered today. Your results will be released to [place] (or called to you) after review, usually within 72hours after test completion. If any changes need to be made, you will be notified at that same time.  Medications reviewed and updated, no changes recommended at this time.  we'll make referral to   . Our office will contact you regarding appointment(s) once made.  Please schedule followup in 12 months for annual exam and labs, call sooner if problems.  Health Maintenance Adopting a healthy lifestyle and getting preventive care can go a long way to promote health and wellness. Talk with your health care provider about what schedule of regular examinations is right for you. This is a good chance for you to check in with your provider about disease prevention and staying healthy. In between checkups, there are plenty of things you can do on your own. Experts have done a lot of research about which lifestyle changes and preventive measures are most likely to keep you healthy. Ask your health care provider for more information. WEIGHT AND DIET  Eat a healthy diet  Be sure to include plenty of vegetables, fruits, low-fat dairy products, and lean protein.  Do not eat a lot of foods high in solid fats, added sugars, or salt.  Get regular exercise. This is one of the most important things you can do for your health.  Most adults should exercise for at least 150 minutes each week. The exercise should increase your heart rate and make you sweat (moderate-intensity exercise).  Most adults should also do strengthening exercises at least twice a week. This is in addition  to the moderate-intensity exercise.  Maintain a healthy weight  Body mass index (BMI) is a measurement that can be used to identify possible weight problems. It estimates body fat based on height and weight. Your health care provider can help determine your BMI and help you achieve or maintain a healthy weight.  For females 29 years of age and older:   A BMI below 18.5 is considered underweight.  A BMI of 18.5 to 24.9 is normal.  A BMI of 25 to 29.9 is considered overweight.  A BMI of 30 and above is considered obese.  Watch levels of cholesterol and blood lipids  You should start having your blood tested for lipids and cholesterol at 71 years of age, then have this test every 5 years.  You may need to have your cholesterol levels checked more often if:  Your lipid or cholesterol levels are high.  You are older than 71 years of age.  You are at high risk for heart disease.  CANCER SCREENING   Lung Cancer  Lung cancer screening is recommended for adults 83-79 years old who are at high risk for lung cancer because of a history of smoking.  A yearly low-dose CT scan of the lungs is recommended for people who:  Currently smoke.  Have quit within the past 15 years.  Have at least a 30-pack-year history of smoking. A pack year is smoking an average of one pack of cigarettes a day for 1 year.  Yearly screening should continue until it has been 15 years since you quit.  Yearly  screening should stop if you develop a health problem that would prevent you from having lung cancer treatment.  Breast Cancer  Practice breast self-awareness. This means understanding how your breasts normally appear and feel.  It also means doing regular breast self-exams. Let your health care provider know about any changes, no matter how small.  If you are in your 20s or 30s, you should have a clinical breast exam (CBE) by a health care provider every 1-3 years as part of a regular health  exam.  If you are 36 or older, have a CBE every year. Also consider having a breast X-ray (mammogram) every year.  If you have a family history of breast cancer, talk to your health care provider about genetic screening.  If you are at high risk for breast cancer, talk to your health care provider about having an MRI and a mammogram every year.  Breast cancer gene (BRCA) assessment is recommended for women who have family members with BRCA-related cancers. BRCA-related cancers include:  Breast.  Ovarian.  Tubal.  Peritoneal cancers.  Results of the assessment will determine the need for genetic counseling and BRCA1 and BRCA2 testing. Cervical Cancer Routine pelvic examinations to screen for cervical cancer are no longer recommended for nonpregnant women who are considered low risk for cancer of the pelvic organs (ovaries, uterus, and vagina) and who do not have symptoms. A pelvic examination may be necessary if you have symptoms including those associated with pelvic infections. Ask your health care provider if a screening pelvic exam is right for you.   The Pap test is the screening test for cervical cancer for women who are considered at risk.  If you had a hysterectomy for a problem that was not cancer or a condition that could lead to cancer, then you no longer need Pap tests.  If you are older than 65 years, and you have had normal Pap tests for the past 10 years, you no longer need to have Pap tests.  If you have had past treatment for cervical cancer or a condition that could lead to cancer, you need Pap tests and screening for cancer for at least 20 years after your treatment.  If you no longer get a Pap test, assess your risk factors if they change (such as having a new sexual partner). This can affect whether you should start being screened again.  Some women have medical problems that increase their chance of getting cervical cancer. If this is the case for you, your health  care provider may recommend more frequent screening and Pap tests.  The human papillomavirus (HPV) test is another test that may be used for cervical cancer screening. The HPV test looks for the virus that can cause cell changes in the cervix. The cells collected during the Pap test can be tested for HPV.  The HPV test can be used to screen women 48 years of age and older. Getting tested for HPV can extend the interval between normal Pap tests from three to five years.  An HPV test also should be used to screen women of any age who have unclear Pap test results.  After 71 years of age, women should have HPV testing as often as Pap tests.  Colorectal Cancer  This type of cancer can be detected and often prevented.  Routine colorectal cancer screening usually begins at 71 years of age and continues through 71 years of age.  Your health care provider may recommend screening at an  earlier age if you have risk factors for colon cancer.  Your health care provider may also recommend using home test kits to check for hidden blood in the stool.  A small camera at the end of a tube can be used to examine your colon directly (sigmoidoscopy or colonoscopy). This is done to check for the earliest forms of colorectal cancer.  Routine screening usually begins at age 50.  Direct examination of the colon should be repeated every 5-10 years through 71 years of age. However, you may need to be screened more often if early forms of precancerous polyps or small growths are found. Skin Cancer  Check your skin from head to toe regularly.  Tell your health care provider about any new moles or changes in moles, especially if there is a change in a mole's shape or color.  Also tell your health care provider if you have a mole that is larger than the size of a pencil eraser.  Always use sunscreen. Apply sunscreen liberally and repeatedly throughout the day.  Protect yourself by wearing long sleeves, pants, a  wide-brimmed hat, and sunglasses whenever you are outside. HEART DISEASE, DIABETES, AND HIGH BLOOD PRESSURE   Have your blood pressure checked at least every 1-2 years. High blood pressure causes heart disease and increases the risk of stroke.  If you are between 55 years and 79 years old, ask your health care provider if you should take aspirin to prevent strokes.  Have regular diabetes screenings. This involves taking a blood sample to check your fasting blood sugar level.  If you are at a normal weight and have a low risk for diabetes, have this test once every three years after 71 years of age.  If you are overweight and have a high risk for diabetes, consider being tested at a younger age or more often. PREVENTING INFECTION  Hepatitis B  If you have a higher risk for hepatitis B, you should be screened for this virus. You are considered at high risk for hepatitis B if:  You were born in a country where hepatitis B is common. Ask your health care provider which countries are considered high risk.  Your parents were born in a high-risk country, and you have not been immunized against hepatitis B (hepatitis B vaccine).  You have HIV or AIDS.  You use needles to inject street drugs.  You live with someone who has hepatitis B.  You have had sex with someone who has hepatitis B.  You get hemodialysis treatment.  You take certain medicines for conditions, including cancer, organ transplantation, and autoimmune conditions. Hepatitis C  Blood testing is recommended for:  Everyone born from 1945 through 1965.  Anyone with known risk factors for hepatitis C. Sexually transmitted infections (STIs)  You should be screened for sexually transmitted infections (STIs) including gonorrhea and chlamydia if:  You are sexually active and are younger than 71 years of age.  You are older than 71 years of age and your health care provider tells you that you are at risk for this type of  infection.  Your sexual activity has changed since you were last screened and you are at an increased risk for chlamydia or gonorrhea. Ask your health care provider if you are at risk.  If you do not have HIV, but are at risk, it may be recommended that you take a prescription medicine daily to prevent HIV infection. This is called pre-exposure prophylaxis (PrEP). You are considered at risk   if:  You are sexually active and do not regularly use condoms or know the HIV status of your partner(s).  You take drugs by injection.  You are sexually active with a partner who has HIV. Talk with your health care provider about whether you are at high risk of being infected with HIV. If you choose to begin PrEP, you should first be tested for HIV. You should then be tested every 3 months for as long as you are taking PrEP.  PREGNANCY   If you are premenopausal and you may become pregnant, ask your health care provider about preconception counseling.  If you may become pregnant, take 400 to 800 micrograms (mcg) of folic acid every day.  If you want to prevent pregnancy, talk to your health care provider about birth control (contraception). OSTEOPOROSIS AND MENOPAUSE   Osteoporosis is a disease in which the bones lose minerals and strength with aging. This can result in serious bone fractures. Your risk for osteoporosis can be identified using a bone density scan.  If you are 65 years of age or older, or if you are at risk for osteoporosis and fractures, ask your health care provider if you should be screened.  Ask your health care provider whether you should take a calcium or vitamin D supplement to lower your risk for osteoporosis.  Menopause may have certain physical symptoms and risks.  Hormone replacement therapy may reduce some of these symptoms and risks. Talk to your health care provider about whether hormone replacement therapy is right for you.  HOME CARE INSTRUCTIONS   Schedule regular  health, dental, and eye exams.  Stay current with your immunizations.   Do not use any tobacco products including cigarettes, chewing tobacco, or electronic cigarettes.  If you are pregnant, do not drink alcohol.  If you are breastfeeding, limit how much and how often you drink alcohol.  Limit alcohol intake to no more than 1 drink per day for nonpregnant women. One drink equals 12 ounces of beer, 5 ounces of wine, or 1 ounces of hard liquor.  Do not use street drugs.  Do not share needles.  Ask your health care provider for help if you need support or information about quitting drugs.  Tell your health care provider if you often feel depressed.  Tell your health care provider if you have ever been abused or do not feel safe at home. Document Released: 12/22/2010 Document Revised: 10/23/2013 Document Reviewed: 05/10/2013 [employer] Patient Information 2015 [employer]. This information is not intended to replace advice given to you by your health care provider. Make sure you discuss any questions you have with your health care provider.  

## 2014-11-05 NOTE — Progress Notes (Signed)
Subjective:    Patient ID: Darlene Park, female    DOB: 06/18/1946, 69 y.o.   MRN: [id number]  HPI   Here for medicare wellness  Diet: heart healthy  Physical activity: sedentary Depression/mood screen: negative Hearing: intact to whispered voice Visual acuity: grossly normal, performs annual eye exam  ADLs: capable Fall risk: none Home safety: good Cognitive evaluation: intact to orientation, naming, recall and repetition EOL planning: adv directives, full code/ I agree  I have personally reviewed and have noted 1. The patient's medical and social history 2. Their use of alcohol, tobacco or illicit drugs 3. Their current medications and supplements 4. The patient's functional ability including ADL's, fall risks, home safety risks and hearing or visual impairment. 5. Diet and physical activities 6. Evidence for depression or mood disorders  Also reviewed chronic conditions, interval events and current concerns  Past Medical History  Diagnosis Date  . VITAMIN D DEFICIENCY   . HYPERTENSION   . GERD   . URINARY INCONTINENCE   . Malignant neoplasm of cecum 03/28/2009    resection; No chemo/adj tx per onc  . Osteoarthritis of left knee    Family History  Problem Relation Age of Onset  . Adopted: Yes  . Hypertension Other   . Heart disease Other   . Prostate cancer Other   . Colon cancer Neg Hx   . Esophageal cancer Neg Hx   . Rectal cancer Neg Hx   . Stomach cancer Neg Hx    History  Substance Use Topics  . Smoking status: Never Smoker   . Smokeless tobacco: Never Used  . Alcohol Use: 0.0 oz/week    0 Standard drinks or equivalent per week     Comment: socially    Review of Systems  Constitutional: Positive for fatigue. Negative for unexpected weight change.  Respiratory: Negative for cough, shortness of breath and wheezing.   Cardiovascular: Negative for chest pain, palpitations and leg swelling.  Gastrointestinal: Negative for nausea, abdominal  pain and diarrhea.  Neurological: Negative for dizziness, weakness, light-headedness and headaches.  Psychiatric/Behavioral: Negative for suicidal ideas, sleep disturbance, self-injury, dysphoric mood and decreased concentration. The patient is not nervous/anxious.   All other systems reviewed and are negative.      Objective:    Physical Exam  Constitutional: She is oriented to person, place, and time. She appears well-developed and well-nourished. No distress.  overweight  Cardiovascular: Normal rate, regular rhythm and normal heart sounds.   No murmur heard. Pulmonary/Chest: Effort normal and breath sounds normal. No respiratory distress.  Musculoskeletal: She exhibits no edema.  "fatty ankles"  Neurological: She is alert and oriented to person, place, and time.  Vitals reviewed.   BP 122/78 mmHg  Pulse 78  Temp(Src) 98 F (36.7 C) (Oral)  Ht 5\' 2"  (1.575 m)  Wt 186 lb (84.369 kg)  BMI 34.01 kg/m2  SpO2 99% Wt Readings from Last 3 Encounters:  11/05/14 186 lb (84.369 kg)  01/10/14 177 lb (80.287 kg)  06/13/13 177 lb 1.9 oz (80.341 kg)     Lab Results  Component Value Date   WBC 6.5 06/13/2013   HGB 11.6* 06/13/2013   HCT 34.5* 06/13/2013   PLT 282.0 06/13/2013   GLUCOSE 79 06/13/2013   CHOL 176 06/13/2013   TRIG 72.0 06/13/2013   HDL 47.00 06/13/2013   LDLCALC 115* 06/13/2013   ALT 21 06/13/2013   AST 20 06/13/2013   NA 138 06/13/2013   K 3.9 06/13/2013   CL  105 06/13/2013   CREATININE 0.8 06/13/2013   BUN 11 06/13/2013   CO2 26 06/13/2013   TSH 0.79 06/13/2013    No results found.     Assessment & Plan:   AWV/z00.00 - Today patient counseled on age appropriate routine health concerns for screening and prevention, each reviewed and up to date or declined. Immunizations reviewed and up to date or declined. Labs ordered and reviewed. Risk factors for depression reviewed and negative. Hearing function and visual acuity are intact. ADLs screened and  addressed as needed. Functional ability and level of safety reviewed and appropriate. Education, counseling and referrals performed based on assessed risks today. Patient provided with a copy of personalized plan for preventive services.  Problem List Items Addressed This Visit    Essential hypertension    At goal on current therapy - pt prefers separate dosing of ARB and HCTZ meds but consider combination therapy in future Check lipids annually  patient is asked to make an attempt to improve diet and exercise patterns to aid in medical management of this problem.   BP Readings from Last 3 Encounters:  11/05/14 122/78  01/10/14 130/72  06/13/13 120/72        Relevant Medications   hydrochlorothiazide (HYDRODIURIL) 25 MG tablet   olmesartan (BENICAR) 20 MG tablet   Other Relevant Orders   Lipid panel   Basic metabolic panel   Obese    Weight changes reviewed - encouraged continued exercise and diet efforts   Wt Readings from Last 3 Encounters:  11/05/14 186 lb (84.369 kg)  01/10/14 177 lb (80.287 kg)  06/13/13 177 lb 1.9 oz (80.341 kg)        Vitamin D deficiency    Taking OTC Vit D 1000U/d Check DEXA No history of fractures      Relevant Orders   DG Bone Density   Vit D  25 hydroxy (rtn osteoporosis monitoring)    Other Visit Diagnoses    Routine general medical examination at a health care facility    -  Primary    Need for prophylactic vaccination against Streptococcus pneumoniae (pneumococcus)        Relevant Orders    Pneumococcal conjugate vaccine 13-valent (Completed)    Screening for breast cancer        Relevant Orders    MM DIGITAL SCREENING BILATERAL        Gwendolyn Grant, MD

## 2014-11-05 NOTE — Assessment & Plan Note (Signed)
At goal on current therapy - pt prefers separate dosing of ARB and HCTZ meds but consider combination therapy in future Check lipids annually  patient is asked to make an attempt to improve diet and exercise patterns to aid in medical management of this problem.   BP Readings from Last 3 Encounters:  11/05/14 122/78  01/10/14 130/72  06/13/13 120/72

## 2014-11-05 NOTE — Assessment & Plan Note (Signed)
Taking OTC Vit D 1000U/d Check DEXA No history of fractures

## 2014-11-06 LAB — HEPATITIS C ANTIBODY: HCV Ab: NEGATIVE

## 2014-11-09 ENCOUNTER — Telehealth: Payer: Self-pay

## 2014-11-09 NOTE — Telephone Encounter (Signed)
Received pharmacy rejection stating that insurance will not cover diclofenac 1% gel  without a prior authorization. PA submitted to insurance via covermymeds, approval now pending their decision.

## 2014-11-13 NOTE — Telephone Encounter (Signed)
PA for diclofenac approved under medicare part D.  [facility] informed.

## 2015-10-10 ENCOUNTER — Other Ambulatory Visit: Payer: Self-pay

## 2015-10-16 ENCOUNTER — Telehealth: Payer: Self-pay | Admitting: Internal Medicine

## 2015-10-16 NOTE — Telephone Encounter (Signed)
LVM for pt to call back as soon as possible about making an appt with a new pcp.

## 2015-10-16 NOTE — Telephone Encounter (Signed)
Pt needs a refill on hctz

## 2015-10-23 ENCOUNTER — Encounter: Payer: Self-pay | Admitting: Family

## 2015-10-23 ENCOUNTER — Ambulatory Visit (INDEPENDENT_AMBULATORY_CARE_PROVIDER_SITE_OTHER): Payer: Medicare Other | Admitting: Family

## 2015-10-23 VITALS — BP 128/84 | HR 75 | Temp 97.8°F | Resp 16 | Ht 62.0 in | Wt 190.0 lb

## 2015-10-23 DIAGNOSIS — I10 Essential (primary) hypertension: Secondary | ICD-10-CM

## 2015-10-23 DIAGNOSIS — M1712 Unilateral primary osteoarthritis, left knee: Secondary | ICD-10-CM | POA: Diagnosis not present

## 2015-10-23 MED ORDER — DICLOFENAC SODIUM 1 % TD GEL: 2.0000 g | Freq: Three times a day (TID) | TRANSDERMAL | Status: DC | PRN

## 2015-10-23 MED ORDER — OLMESARTAN MEDOXOMIL 20 MG PO TABS: 20.0000 mg | ORAL_TABLET | Freq: Every day | ORAL | Status: DC

## 2015-10-23 MED ORDER — HYDROCHLOROTHIAZIDE 25 MG PO TABS: 25.0000 mg | ORAL_TABLET | Freq: Every day | ORAL | Status: DC

## 2015-10-23 NOTE — Patient Instructions (Signed)
Thank you for choosing [facility].  Summary/Instructions:  Please continue to take your medication as prescribed.   Your prescription(s) have been submitted to your pharmacy or been printed and provided for you. Please take as directed and contact our office if you believe you are having problem(s) with the medication(s) or have any questions.   

## 2015-10-23 NOTE — Progress Notes (Signed)
Pre visit review using our clinic review tool, if applicable. No additional management support is needed unless otherwise documented below in the visit note. 

## 2015-10-23 NOTE — Assessment & Plan Note (Addendum)
Blood pressure is stable below goal 140/90 with current regimen and no adverse side effects. No apparent symptoms of end organ damage. Continue to monitor blood pressure at home. Continue current dosage of hydrochlorothiazide and losartan.

## 2015-10-23 NOTE — Assessment & Plan Note (Signed)
Osteoarthritis appears stable with current regimen with no adverse side effects. Continue current dosage of diclofenac gel. Advised to continue icing and home exercise therapy as needed.

## 2015-10-23 NOTE — Progress Notes (Signed)
Subjective:    Patient ID: Darlene Park, female    DOB: [birth year], 70 y.o.   MRN: [id number]  Chief Complaint  Patient presents with  . Medication Refill    HPI:  Darlene Park is a 70 y.o. female who  has a past medical history of VITAMIN D DEFICIENCY; HYPERTENSION; GERD; URINARY INCONTINENCE; Malignant neoplasm of cecum ([place]) (03/28/2009); and Osteoarthritis of left knee. and presents today   1.) Hypertension - currently maintained on losartan and hydrochlorothiazide. Reports taking the medication as prescribed and denies adverse side effects. Denies symptoms of end organ damage. Home blood readings have been averaging within the normal range.  BP Readings from Last 3 Encounters:  10/23/15 128/84  11/05/14 122/78  01/10/14 130/72   2.) Left knee pain - Currently maintained on diclofenac gel. Reports taking the medication as prescribed and denies adverse side effects. . Does continue to have some joint pains on occasion.   Allergies  Allergen Reactions  . Penicillins     REACTION: Rash, blisters     Outpatient Prescriptions Prior to Visit  Medication Sig Dispense Refill  . Cholecalciferol 1000 UNIT capsule Take 1,000 Units by mouth daily.      . diclofenac sodium (VOLTAREN) 1 % GEL Apply 2 g topically 3 (three) times daily as needed. 100 g 1  . hydrochlorothiazide (HYDRODIURIL) 25 MG tablet Take 1 tablet (25 mg total) by mouth daily. 90 tablet 3  . olmesartan (BENICAR) 20 MG tablet Take 1 tablet (20 mg total) by mouth daily. 90 tablet 3   No facility-administered medications prior to visit.    Review of Systems  Constitutional: Negative for fever and chills.  Eyes:       Negative for changes in vision  Respiratory: Negative for cough, chest tightness and wheezing.   Cardiovascular: Negative for chest pain, palpitations and leg swelling.  Neurological: Negative for dizziness, weakness and light-headedness.      Objective:    BP 128/84 mmHg  Pulse 75   Temp(Src) 97.8 F (36.6 C) (Oral)  Resp 16  Ht 5\' 2"  (1.575 m)  Wt 190 lb (86.183 kg)  BMI 34.74 kg/m2  SpO2 99% Nursing note and vital signs reviewed.  Physical Exam  Constitutional: She is oriented to person, place, and time. She appears well-developed and well-nourished. No distress.  Cardiovascular: Normal rate, regular rhythm, normal heart sounds and intact distal pulses.   Pulmonary/Chest: Effort normal and breath sounds normal.  Musculoskeletal:  Left knee - no obvious deformity, discoloration, or edema noted. Mild tenderness elicited over generalized area of her knee. Range of motion is within normal limits. Strength is normal. Medicine meniscal testing are negative. Distal pulses and sensation are intact and appropriate.  Neurological: She is alert and oriented to person, place, and time.  Skin: Skin is warm and dry.  Psychiatric: She has a normal mood and affect. Her behavior is normal. Judgment and thought content normal.       Assessment & Plan:   Problem List Items Addressed This Visit      Cardiovascular and Mediastinum   Essential hypertension - Primary    Blood pressure is stable below goal 140/90 with current regimen and no adverse side effects. No apparent symptoms of end organ damage. Continue to monitor blood pressure at home. Continue current dosage of hydrochlorothiazide and losartan.      Relevant Medications   olmesartan (BENICAR) 20 MG tablet   hydrochlorothiazide (HYDRODIURIL) 25 MG tablet     Musculoskeletal  and Integument   Osteoarthritis of left knee    Osteoarthritis appears stable with current regimen with no adverse side effects. Continue current dosage of diclofenac gel. Advised to continue icing and home exercise therapy as needed.      Relevant Medications   diclofenac sodium (VOLTAREN) 1 % GEL       I am having [name] maintain her Cholecalciferol, diclofenac sodium, olmesartan, and hydrochlorothiazide.   Meds ordered this  encounter  Medications  . diclofenac sodium (VOLTAREN) 1 % GEL    Sig: Apply 2 g topically 3 (three) times daily as needed.    Dispense:  100 g    Refill:  1  . olmesartan (BENICAR) 20 MG tablet    Sig: Take 1 tablet (20 mg total) by mouth daily.    Dispense:  90 tablet    Refill:  0    Discontinue all previous refills for this medication. Hold/Fill when appropriate according to pt rx fill schedule.    Order Specific Question:  Supervising Provider    Answer:  Pricilla Holm A [id number]  . hydrochlorothiazide (HYDRODIURIL) 25 MG tablet    Sig: Take 1 tablet (25 mg total) by mouth daily.    Dispense:  90 tablet    Refill:  0    Order Specific Question:  Supervising Provider    Answer:  Pricilla Holm A [id number]     Follow-up: With new PCP or sooner if needed.   Mauricio Po, FNP

## 2015-11-06 ENCOUNTER — Encounter: Payer: Medicare Other | Admitting: Internal Medicine

## 2015-12-11 ENCOUNTER — Other Ambulatory Visit: Payer: Self-pay | Admitting: Family

## 2016-01-13 ENCOUNTER — Encounter: Payer: Self-pay | Admitting: Internal Medicine

## 2016-01-13 ENCOUNTER — Ambulatory Visit (INDEPENDENT_AMBULATORY_CARE_PROVIDER_SITE_OTHER): Payer: Medicare Other | Admitting: Internal Medicine

## 2016-01-13 ENCOUNTER — Other Ambulatory Visit (INDEPENDENT_AMBULATORY_CARE_PROVIDER_SITE_OTHER): Payer: Medicare Other

## 2016-01-13 VITALS — BP 134/76 | HR 78 | Temp 98.0°F | Resp 16 | Ht 62.0 in | Wt 193.0 lb

## 2016-01-13 DIAGNOSIS — I10 Essential (primary) hypertension: Secondary | ICD-10-CM | POA: Diagnosis not present

## 2016-01-13 DIAGNOSIS — Z0000 Encounter for general adult medical examination without abnormal findings: Secondary | ICD-10-CM | POA: Diagnosis not present

## 2016-01-13 DIAGNOSIS — E669 Obesity, unspecified: Secondary | ICD-10-CM | POA: Diagnosis not present

## 2016-01-13 DIAGNOSIS — C180 Malignant neoplasm of cecum: Secondary | ICD-10-CM | POA: Diagnosis not present

## 2016-01-13 DIAGNOSIS — M1712 Unilateral primary osteoarthritis, left knee: Secondary | ICD-10-CM | POA: Diagnosis not present

## 2016-01-13 LAB — LIPID PANEL
CHOL/HDL RATIO: 4
Cholesterol: 177 mg/dL (ref 0–200)
HDL: 43.2 mg/dL (ref >39.00)
LDL CALC: 118 mg/dL — AB (ref 0–99)
NONHDL: 133.71
Triglycerides: 80 mg/dL (ref 0.0–149.0)
VLDL: 16 mg/dL (ref 0.0–40.0)

## 2016-01-13 LAB — CBC WITH DIFFERENTIAL/PLATELET
BASOS PCT: 0.3 % (ref 0.0–3.0)
Basophils Absolute: 0 10*3/uL (ref 0.0–0.1)
EOS PCT: 5.2 % — AB (ref 0.0–5.0)
Eosinophils Absolute: 0.4 10*3/uL (ref 0.0–0.7)
HEMATOCRIT: 34.2 % — AB (ref 36.0–46.0)
Hemoglobin: 11.5 g/dL — ABNORMAL LOW (ref 12.0–15.0)
LYMPHS ABS: 2.3 10*3/uL (ref 0.7–4.0)
LYMPHS PCT: 30.7 % (ref 12.0–46.0)
MCHC: 33.5 g/dL (ref 30.0–36.0)
MCV: 87.7 fl (ref 78.0–100.0)
MONOS PCT: 7.1 % (ref 3.0–12.0)
Monocytes Absolute: 0.5 10*3/uL (ref 0.1–1.0)
NEUTROS ABS: 4.2 10*3/uL (ref 1.4–7.7)
NEUTROS PCT: 56.7 % (ref 43.0–77.0)
PLATELETS: 291 10*3/uL (ref 150.0–400.0)
RBC: 3.9 Mil/uL (ref 3.87–5.11)
RDW: 13.4 % (ref 11.5–15.5)
WBC: 7.4 10*3/uL (ref 4.0–10.5)

## 2016-01-13 LAB — COMPREHENSIVE METABOLIC PANEL
ALT: 16 U/L (ref 0–35)
AST: 21 U/L (ref 0–37)
Albumin: 4.1 g/dL (ref 3.5–5.2)
Alkaline Phosphatase: 94 U/L (ref 39–117)
BILIRUBIN TOTAL: 0.4 mg/dL (ref 0.2–1.2)
BUN: 15 mg/dL (ref 6–23)
CALCIUM: 9.4 mg/dL (ref 8.4–10.5)
CHLORIDE: 104 meq/L (ref 96–112)
CO2: 28 meq/L (ref 19–32)
Creatinine, Ser: 1.1 mg/dL (ref 0.40–1.20)
GFR: 51.88 mL/min — AB (ref >60.00)
GLUCOSE: 79 mg/dL (ref 70–99)
POTASSIUM: 3.8 meq/L (ref 3.5–5.1)
Sodium: 138 mEq/L (ref 135–145)
Total Protein: 7.4 g/dL (ref 6.0–8.3)

## 2016-01-13 LAB — TSH: TSH: 0.86 u[IU]/mL (ref 0.35–4.50)

## 2016-01-13 NOTE — Assessment & Plan Note (Signed)
Uses voltaren gel as needed

## 2016-01-13 NOTE — Progress Notes (Signed)
Pre visit review using our clinic review tool, if applicable. No additional management support is needed unless otherwise documented below in the visit note. 

## 2016-01-13 NOTE — Assessment & Plan Note (Signed)
Colonoscopy due 05/2017 No evidence of recurrence to date

## 2016-01-13 NOTE — Assessment & Plan Note (Signed)
BP well controlled Current regimen effective and well tolerated Continue current medications at current doses cmp  

## 2016-01-13 NOTE — Assessment & Plan Note (Signed)
Stressed exercise and weight loss

## 2016-01-13 NOTE — Progress Notes (Signed)
Subjective:    Patient ID: Darlene Park, female    DOB: Jan 18, 1946, 70 y.o.   MRN: [id number]  HPI She is here to establish with a new pcp.  Here for medicare wellness exam/physical.   I have personally reviewed and have noted 1.The patient's medical and social history 2.Their use of alcohol, tobacco or illicit drugs 3.Their current medications and supplements 4.The patient's functional ability including ADL's, fall risks, home safety risks and                 hearing or visual impairment. 5.Diet and physical activities 6.Evidence for depression or mood disorders 7.Care team reviewed:   none    Are there smokers in your home (other than you)? Daughter - she smokes outside   Risk Factors Exercise: none, was walking the dog an knows she needs to start again Dietary issues discussed: tries to control portions, eats well balanced diet   Cardiac risk factors: advanced age, hypertension, and obesity (BMI >= 30 kg/m2).  Depression Screen  Have you felt down, depressed or hopeless? No  Have you felt little interest or pleasure in doing things?  No  Activities of Daily Living In your present state of health, do you have any difficulty performing the following activities?:  Driving? No Managing money?  No Feeding yourself? No Getting from bed to chair? No Climbing a flight of stairs? No Preparing food and eating?: No Bathing or showering? No Getting dressed: No Getting to/using the toilet? No Moving around from place to place: No In the past year have you fallen or had a near fall?: No   Are you sexually active?  No  Do you have more than one partner?  N/A  Hearing Difficulties: No Do you often ask people to speak up or repeat themselves? No Do you experience ringing or noises in your ears? No Do you have difficulty understanding soft or whispered voices? No Vision:              Any change in vision:  no             Up to date with eye exam:  Up to date   Memory:  Do you feel that you have a problem with memory? No  Do you often misplace items? No  Do you feel safe at home?  Yes  Cognitive Testing  Alert, Orientated? Yes  Normal Appearance? Yes  Recall of three objects?  Yes  Can perform simple calculations? Yes  Displays appropriate judgment? Yes  Can read the correct time from a watch face? Yes   Advanced Directives have been discussed with the patient? Yes   Medications and allergies reviewed with patient and updated if appropriate.  Patient Active Problem List   Diagnosis Date Noted  . Osteoarthritis of left knee   . Obese 09/25/2010  . MALIGNANT NEOPLASM OF CECUM 03/28/2009  . Vitamin D deficiency 11/29/2008  . Essential hypertension 11/14/2008  . GERD 11/14/2008  . URINARY INCONTINENCE 11/14/2008  . CHICKENPOX, HX OF 11/14/2008    Current Outpatient Prescriptions on File Prior to Visit  Medication Sig Dispense Refill  . Cholecalciferol 1000 UNIT capsule Take 1,000 Units by mouth daily.      . diclofenac sodium (VOLTAREN) 1 % GEL Apply 2 g topically 3 (three) times daily as needed. 100 g 1  . hydrochlorothiazide (HYDRODIURIL) 25 MG tablet Take 1 tablet by mouth  daily 90 tablet 1  . olmesartan (BENICAR) 20 MG tablet  Take 1 tablet (20 mg total) by mouth daily. 90 tablet 0   No current facility-administered medications on file prior to visit.     Past Medical History:  Diagnosis Date  . GERD   . HYPERTENSION   . Malignant neoplasm of cecum ([place]) 03/28/2009   resection; No chemo/adj tx per onc  . Osteoarthritis of left knee   . URINARY INCONTINENCE   . VITAMIN D DEFICIENCY     Past Surgical History:  Procedure Laterality Date  . ABDOMINAL HYSTERECTOMY  1976   Partial precancer cells done @ [facility] in [place]  12/2008  . COLONOSCOPY    . HEMORRHOID SURGERY  1980's    Social History   Social History  . Marital status:  Divorced    Spouse name: N/A  . Number of children: N/A  . Years of education: N/A   Social History Main Topics  . Smoking status: Never Smoker  . Smokeless tobacco: Never Used  . Alcohol use 0.0 oz/week     Comment: socially  . Drug use: No  . Sexual activity: Not on file   Other Topics Concern  . Not on file   Social History Narrative   Retired from [employer] with daughter at her home    Family History  Problem Relation Age of Onset  . Adopted: Yes  . Hypertension Other   . Heart disease Other   . Prostate cancer Other   . Colon cancer Neg Hx   . Esophageal cancer Neg Hx   . Rectal cancer Neg Hx   . Stomach cancer Neg Hx     Review of Systems  Constitutional: Negative for activity change, chills, fatigue and fever.  HENT: Negative for hearing loss and tinnitus.   Eyes: Negative for visual disturbance.  Respiratory: Negative for cough, shortness of breath and wheezing.   Cardiovascular: Negative for chest pain, palpitations and leg swelling.  Gastrointestinal: Negative for abdominal pain, blood in stool, constipation, diarrhea and nausea.       No GERD  Genitourinary: Negative for dysuria and hematuria.  Musculoskeletal: Positive for arthralgias (left knee pain). Negative for back pain.  Skin: Negative for color change and rash.  Neurological: Negative for dizziness, light-headedness and headaches.  Psychiatric/Behavioral: Negative for dysphoric mood. The patient is not nervous/anxious.        Objective:   Vitals:   01/13/16 1300  BP: 134/76  Pulse: 78  Resp: 16  Temp: 98 F (36.7 C)   Filed Weights   01/13/16 1300  Weight: 193 lb (87.5 kg)   Body mass index is 35.3 kg/m.   Physical Exam Constitutional: She appears well-developed and well-nourished. No distress.  HENT:  Head: Normocephalic and atraumatic.  Right Ear: External ear normal. Normal ear canal and TM Left Ear: External ear normal.  Normal ear canal and  TM Mouth/Throat: Oropharynx is clear and moist.  Eyes: Conjunctivae and EOM are normal.  Neck: Neck supple. No tracheal deviation present. No thyromegaly present.  No carotid bruit  Cardiovascular: Normal rate, regular rhythm and normal heart sounds.   No murmur heard.  No edema. Pulmonary/Chest: Effort normal and breath sounds normal. No respiratory distress. She has no wheezes. She has no rales.  Breast: deferred  Abdominal: Soft. She exhibits no distension. There is no tenderness.  Lymphadenopathy: She has no cervical adenopathy.  Skin: Skin is warm and dry. She is not diaphoretic.  Psychiatric: She has a  normal mood and affect. Her behavior is normal.         Assessment & Plan:   Wellness Exam: Immunizations - discussed shingles vaccine, other vaccines up to date Colonoscopy  Up to date  Mammogram - not up to date, will call to schedule Dexa - will schedule with mammogram Gyn - no longer sees Eye exam - due, will schedule Hearing loss  none Memory concerns/difficulties Independent of ADLs  fully Stressed the importance of regular exercise   Patient received copy of preventative screening tests/immunizations recommended for the next 5-10 years.   Physical exam: Screening blood work ordered Immunizations - discussed shingles vaccine, other vaccines up to date Colonoscopy  Up to date  Mammogram - not up to date - will schedule Gyn - no longer sees gyn Dexa - will schedule with mammogram Eye exams  Due - will schedule Exercise - not currently exercising - stressed regular exercise Weight - recommended weight loss Skin - no concerns Substance abuse - none  See Problem List for Assessment and Plan of chronic medical problems.

## 2016-01-13 NOTE — Patient Instructions (Addendum)
Darlene Park , Thank you for taking time to come for your Medicare Wellness Visit. I appreciate your ongoing commitment to your health goals. Please review the following plan we discussed and let me know if I can assist you in the future.   These are the goals we discussed: Goals    Start exercising, work on weight loss, schedule mammogram/dexa and eye exam      This is a list of the screening recommended for you and due dates:  Health Maintenance  Topic Date Due  . Shingles Vaccine  12/15/2003  . DEXA scan (bone density measurement)  12/14/2008  . Mammogram  03/14/2012  . Flu Shot  01/21/2016  . Colon Cancer Screening  06/07/2017  . Tetanus Vaccine  01/11/2024  .  Hepatitis C: One time screening is recommended by Center for Disease Control  (CDC) for  adults born from [birth year] through 1967.   Completed  . Pneumonia vaccines  Completed     Test(s) ordered today. Your results will be released to Dargan (or called to you) after review, usually within 72hours after test completion. If any changes need to be made, you will be notified at that same time.  All other Health Maintenance issues reviewed.   All recommended immunizations and age-appropriate screenings are up-to-date or discussed.  No immunizations administered today.   Medications reviewed and updated.  No changes recommended at this time.  Please followup in one year  Health Maintenance, Female Adopting a healthy lifestyle and getting preventive care can go a long way to promote health and wellness. Talk with your health care provider about what schedule of regular examinations is right for you. This is a good chance for you to check in with your provider about disease prevention and staying healthy. In between checkups, there are plenty of things you can do on your own. Experts have done a lot of research about which lifestyle changes and preventive measures are most likely to keep you healthy. Ask your health care provider  for more information. WEIGHT AND DIET  Eat a healthy diet  Be sure to include plenty of vegetables, fruits, low-fat dairy products, and lean protein.  Do not eat a lot of foods high in solid fats, added sugars, or salt.  Get regular exercise. This is one of the most important things you can do for your health.  Most adults should exercise for at least 150 minutes each week. The exercise should increase your heart rate and make you sweat (moderate-intensity exercise).  Most adults should also do strengthening exercises at least twice a week. This is in addition to the moderate-intensity exercise.  Maintain a healthy weight  Body mass index (BMI) is a measurement that can be used to identify possible weight problems. It estimates body fat based on height and weight. Your health care provider can help determine your BMI and help you achieve or maintain a healthy weight.  For females 94 years of age and older:   A BMI below 18.5 is considered underweight.  A BMI of 18.5 to 24.9 is normal.  A BMI of 25 to 29.9 is considered overweight.  A BMI of 30 and above is considered obese.  Watch levels of cholesterol and blood lipids  You should start having your blood tested for lipids and cholesterol at 72 years of age, then have this test every 5 years.  You may need to have your cholesterol levels checked more often if:  Your lipid or cholesterol levels are  high.  You are older than 72 years of age.  You are at high risk for heart disease.  CANCER SCREENING   Lung Cancer  Lung cancer screening is recommended for adults 59-5 years old who are at high risk for lung cancer because of a history of smoking.  A yearly low-dose CT scan of the lungs is recommended for people who:  Currently smoke.  Have quit within the past 15 years.  Have at least a 30-pack-year history of smoking. A pack year is smoking an average of one pack of cigarettes a day for 1 year.  Yearly screening  should continue until it has been 15 years since you quit.  Yearly screening should stop if you develop a health problem that would prevent you from having lung cancer treatment.  Breast Cancer  Practice breast self-awareness. This means understanding how your breasts normally appear and feel.  It also means doing regular breast self-exams. Let your health care provider know about any changes, no matter how small.  If you are in your 20s or 30s, you should have a clinical breast exam (CBE) by a health care provider every 1-3 years as part of a regular health exam.  If you are 21 or older, have a CBE every year. Also consider having a breast X-ray (mammogram) every year.  If you have a family history of breast cancer, talk to your health care provider about genetic screening.  If you are at high risk for breast cancer, talk to your health care provider about having an MRI and a mammogram every year.  Breast cancer gene (BRCA) assessment is recommended for women who have family members with BRCA-related cancers. BRCA-related cancers include:  Breast.  Ovarian.  Tubal.  Peritoneal cancers.  Results of the assessment will determine the need for genetic counseling and BRCA1 and BRCA2 testing. Cervical Cancer Your health care provider may recommend that you be screened regularly for cancer of the pelvic organs (ovaries, uterus, and vagina). This screening involves a pelvic examination, including checking for microscopic changes to the surface of your cervix (Pap test). You may be encouraged to have this screening done every 3 years, beginning at age 59.  For women ages 69-65, health care providers may recommend pelvic exams and Pap testing every 3 years, or they may recommend the Pap and pelvic exam, combined with testing for human papilloma virus (HPV), every 5 years. Some types of HPV increase your risk of cervical cancer. Testing for HPV may also be done on women of any age with unclear  Pap test results.  Other health care providers may not recommend any screening for nonpregnant women who are considered low risk for pelvic cancer and who do not have symptoms. Ask your health care provider if a screening pelvic exam is right for you.  If you have had past treatment for cervical cancer or a condition that could lead to cancer, you need Pap tests and screening for cancer for at least 20 years after your treatment. If Pap tests have been discontinued, your risk factors (such as having a new sexual partner) need to be reassessed to determine if screening should resume. Some women have medical problems that increase the chance of getting cervical cancer. In these cases, your health care provider may recommend more frequent screening and Pap tests. Colorectal Cancer  This type of cancer can be detected and often prevented.  Routine colorectal cancer screening usually begins at 72 years of age and continues through 72  years of age.  Your health care provider may recommend screening at an earlier age if you have risk factors for colon cancer.  Your health care provider may also recommend using home test kits to check for hidden blood in the stool.  A small camera at the end of a tube can be used to examine your colon directly (sigmoidoscopy or colonoscopy). This is done to check for the earliest forms of colorectal cancer.  Routine screening usually begins at age 64.  Direct examination of the colon should be repeated every 5-10 years through 72 years of age. However, you may need to be screened more often if early forms of precancerous polyps or small growths are found. Skin Cancer  Check your skin from head to toe regularly.  Tell your health care provider about any new moles or changes in moles, especially if there is a change in a mole's shape or color.  Also tell your health care provider if you have a mole that is larger than the size of a pencil eraser.  Always use  sunscreen. Apply sunscreen liberally and repeatedly throughout the day.  Protect yourself by wearing long sleeves, pants, a wide-brimmed hat, and sunglasses whenever you are outside. HEART DISEASE, DIABETES, AND HIGH BLOOD PRESSURE   High blood pressure causes heart disease and increases the risk of stroke. High blood pressure is more likely to develop in:  People who have blood pressure in the high end of the normal range (130-139/85-89 mm Hg).  People who are overweight or obese.  People who are African American.  If you are 63-10 years of age, have your blood pressure checked every 3-5 years. If you are 62 years of age or older, have your blood pressure checked every year. You should have your blood pressure measured twice--once when you are at a hospital or clinic, and once when you are not at a hospital or clinic. Record the average of the two measurements. To check your blood pressure when you are not at a hospital or clinic, you can use:  An automated blood pressure machine at a pharmacy.  A home blood pressure monitor.  If you are between 31 years and 51 years old, ask your health care provider if you should take aspirin to prevent strokes.  Have regular diabetes screenings. This involves taking a blood sample to check your fasting blood sugar level.  If you are at a normal weight and have a low risk for diabetes, have this test once every three years after 72 years of age.  If you are overweight and have a high risk for diabetes, consider being tested at a younger age or more often. PREVENTING INFECTION  Hepatitis B  If you have a higher risk for hepatitis B, you should be screened for this virus. You are considered at high risk for hepatitis B if:  You were born in a country where hepatitis B is common. Ask your health care provider which countries are considered high risk.  Your parents were born in a high-risk country, and you have not been immunized against hepatitis B  (hepatitis B vaccine).  You have HIV or AIDS.  You use needles to inject street drugs.  You live with someone who has hepatitis B.  You have had sex with someone who has hepatitis B.  You get hemodialysis treatment.  You take certain medicines for conditions, including cancer, organ transplantation, and autoimmune conditions. Hepatitis C  Blood testing is recommended for:  Everyone born  from 1945 through 1965.  Anyone with known risk factors for hepatitis C. Sexually transmitted infections (STIs)  You should be screened for sexually transmitted infections (STIs) including gonorrhea and chlamydia if:  You are sexually active and are younger than 72 years of age.  You are older than 72 years of age and your health care provider tells you that you are at risk for this type of infection.  Your sexual activity has changed since you were last screened and you are at an increased risk for chlamydia or gonorrhea. Ask your health care provider if you are at risk.  If you do not have HIV, but are at risk, it may be recommended that you take a prescription medicine daily to prevent HIV infection. This is called pre-exposure prophylaxis (PrEP). You are considered at risk if:  You are sexually active and do not regularly use condoms or know the HIV status of your partner(s).  You take drugs by injection.  You are sexually active with a partner who has HIV. Talk with your health care provider about whether you are at high risk of being infected with HIV. If you choose to begin PrEP, you should first be tested for HIV. You should then be tested every 3 months for as long as you are taking PrEP.  PREGNANCY   If you are premenopausal and you may become pregnant, ask your health care provider about preconception counseling.  If you may become pregnant, take 400 to 800 micrograms (mcg) of folic acid every day.  If you want to prevent pregnancy, talk to your health care provider about birth  control (contraception). OSTEOPOROSIS AND MENOPAUSE   Osteoporosis is a disease in which the bones lose minerals and strength with aging. This can result in serious bone fractures. Your risk for osteoporosis can be identified using a bone density scan.  If you are 2 years of age or older, or if you are at risk for osteoporosis and fractures, ask your health care provider if you should be screened.  Ask your health care provider whether you should take a calcium or vitamin D supplement to lower your risk for osteoporosis.  Menopause may have certain physical symptoms and risks.  Hormone replacement therapy may reduce some of these symptoms and risks. Talk to your health care provider about whether hormone replacement therapy is right for you.  HOME CARE INSTRUCTIONS   Schedule regular health, dental, and eye exams.  Stay current with your immunizations.   Do not use any tobacco products including cigarettes, chewing tobacco, or electronic cigarettes.  If you are pregnant, do not drink alcohol.  If you are breastfeeding, limit how much and how often you drink alcohol.  Limit alcohol intake to no more than 1 drink per day for nonpregnant women. One drink equals 12 ounces of beer, 5 ounces of wine, or 1 ounces of hard liquor.  Do not use street drugs.  Do not share needles.  Ask your health care provider for help if you need support or information about quitting drugs.  Tell your health care provider if you often feel depressed.  Tell your health care provider if you have ever been abused or do not feel safe at home.   This information is not intended to replace advice given to you by your health care provider. Make sure you discuss any questions you have with your health care provider.   Document Released: 12/22/2010 Document Revised: 06/29/2014 Document Reviewed: 05/10/2013 Elsevier Interactive Patient Education 2016  [employer].

## 2016-02-17 ENCOUNTER — Other Ambulatory Visit: Payer: Self-pay | Admitting: Family

## 2016-02-17 DIAGNOSIS — I10 Essential (primary) hypertension: Secondary | ICD-10-CM

## 2016-04-15 ENCOUNTER — Other Ambulatory Visit: Payer: Self-pay | Admitting: Family

## 2016-05-01 ENCOUNTER — Ambulatory Visit (INDEPENDENT_AMBULATORY_CARE_PROVIDER_SITE_OTHER): Payer: Medicare Other

## 2016-05-01 DIAGNOSIS — Z23 Encounter for immunization: Secondary | ICD-10-CM

## 2016-06-17 ENCOUNTER — Telehealth: Payer: Self-pay | Admitting: *Deleted

## 2016-06-17 NOTE — Telephone Encounter (Signed)
Pt drop off coverage form 06/05/16. It states the brand name Benicar will be unavailable being 06/22/2016. Completed a PA on cover my meds. (Key [id number]). Waiting on insurance response...Johny Chess

## 2016-06-23 NOTE — Telephone Encounter (Signed)
Check status for PA still Pending...Darlene Park

## 2016-06-24 NOTE — Telephone Encounter (Signed)
Rec'd additional PA questions. Completed and faxed back to [facility]...Darlene Park

## 2016-06-29 NOTE — Telephone Encounter (Signed)
Rec'd PA back med has been approved w/dates 06/23/16-06/21/17. PA [id number]...Darlene Park

## 2017-01-16 NOTE — Patient Instructions (Addendum)
Darlene Park , Thank you for taking time to come for your Medicare Wellness Visit. I appreciate your ongoing commitment to your health goals. Please review the following plan we discussed and let me know if I can assist you in the future.   These are the goals we discussed: Goals    Work on eating more healthy and increase your water intake.       This is a list of the screening recommended for you and due dates:  Health Maintenance  Topic Date Due  . DEXA scan (bone density measurement)  12/14/2008  . Mammogram  03/14/2012  . Flu Shot  01/20/2017  . Colon Cancer Screening  06/07/2017  . Tetanus Vaccine  01/11/2024  .  Hepatitis C: One time screening is recommended by Center for Disease Control  (CDC) for  adults born from [birth year] through 1967.   Completed  . Pneumonia vaccines  Completed     Test(s) ordered today. Your results will be released to [facility] (or called to you) after review, usually within 72hours after test completion. If any changes need to be made, you will be notified at that same time.  All other Health Maintenance issues reviewed.   All recommended immunizations and age-appropriate screenings are up-to-date or discussed.  Schedule your mammogram and a bone density at [facility].   No immunizations administered today.  [facility]  Medications reviewed and updated.  No changes recommended at this time.  Your prescription(s) have been submitted to your pharmacy. Please take as directed and contact our office if you believe you are having problem(s) with the medication(s).  An EKG was done today.   Please followup in one year, sooner if needed, for a physical/wellness   Health Maintenance, Female Adopting a healthy lifestyle and getting preventive care can go a long way to promote health and wellness. Talk with your health care provider about what schedule of regular examinations is right for you. This is a good chance for you to check  in with your provider about disease prevention and staying healthy. In between checkups, there are plenty of things you can do on your own. Experts have done a lot of research about which lifestyle changes and preventive measures are most likely to keep you healthy. Ask your health care provider for more information. Weight and diet Eat a healthy diet  Be sure to include plenty of vegetables, fruits, low-fat dairy products, and lean protein.  Do not eat a lot of foods high in solid fats, added sugars, or salt.  Get regular exercise. This is one of the most important things you can do for your health. ? Most adults should exercise for at least 150 minutes each week. The exercise should increase your heart rate and make you sweat (moderate-intensity exercise). ? Most adults should also do strengthening exercises at least twice a week. This is in addition to the moderate-intensity exercise.  Maintain a healthy weight  Body mass index (BMI) is a measurement that can be used to identify possible weight problems. It estimates body fat based on height and weight. Your health care provider can help determine your BMI and help you achieve or maintain a healthy weight.  For females 26 years of age and older: ? A BMI below 18.5 is considered underweight. ? A BMI of 18.5 to 24.9 is normal. ? A BMI of 25 to 29.9 is considered overweight. ? A BMI of 30 and above is considered obese.  Watch levels of cholesterol and blood lipids  You should start having your blood tested for lipids and cholesterol at 73 years of age, then have this test every 5 years.  You may need to have your cholesterol levels checked more often if: ? Your lipid or cholesterol levels are high. ? You are older than 73 years of age. ? You are at high risk for heart disease.  Cancer screening Lung Cancer  Lung cancer screening is recommended for adults 49-73 years old who are at high risk for lung cancer because of a history of  smoking.  A yearly low-dose CT scan of the lungs is recommended for people who: ? Currently smoke. ? Have quit within the past 15 years. ? Have at least a 30-pack-year history of smoking. A pack year is smoking an average of one pack of cigarettes a day for 1 year.  Yearly screening should continue until it has been 15 years since you quit.  Yearly screening should stop if you develop a health problem that would prevent you from having lung cancer treatment.  Breast Cancer  Practice breast self-awareness. This means understanding how your breasts normally appear and feel.  It also means doing regular breast self-exams. Let your health care provider know about any changes, no matter how small.  If you are in your 20s or 30s, you should have a clinical breast exam (CBE) by a health care provider every 1-3 years as part of a regular health exam.  If you are 62 or older, have a CBE every year. Also consider having a breast X-ray (mammogram) every year.  If you have a family history of breast cancer, talk to your health care provider about genetic screening.  If you are at high risk for breast cancer, talk to your health care provider about having an MRI and a mammogram every year.  Breast cancer gene (BRCA) assessment is recommended for women who have family members with BRCA-related cancers. BRCA-related cancers include: ? Breast. ? Ovarian. ? Tubal. ? Peritoneal cancers.  Results of the assessment will determine the need for genetic counseling and BRCA1 and BRCA2 testing.  Cervical Cancer Your health care provider may recommend that you be screened regularly for cancer of the pelvic organs (ovaries, uterus, and vagina). This screening involves a pelvic examination, including checking for microscopic changes to the surface of your cervix (Pap test). You may be encouraged to have this screening done every 3 years, beginning at age 71.  For women ages 67-65, health care providers may  recommend pelvic exams and Pap testing every 3 years, or they may recommend the Pap and pelvic exam, combined with testing for human papilloma virus (HPV), every 5 years. Some types of HPV increase your risk of cervical cancer. Testing for HPV may also be done on women of any age with unclear Pap test results.  Other health care providers may not recommend any screening for nonpregnant women who are considered low risk for pelvic cancer and who do not have symptoms. Ask your health care provider if a screening pelvic exam is right for you.  If you have had past treatment for cervical cancer or a condition that could lead to cancer, you need Pap tests and screening for cancer for at least 20 years after your treatment. If Pap tests have been discontinued, your risk factors (such as having a new sexual partner) need to be reassessed to determine if screening should resume. Some women have medical problems that increase  the chance of getting cervical cancer. In these cases, your health care provider may recommend more frequent screening and Pap tests.  Colorectal Cancer  This type of cancer can be detected and often prevented.  Routine colorectal cancer screening usually begins at 73 years of age and continues through 73 years of age.  Your health care provider may recommend screening at an earlier age if you have risk factors for colon cancer.  Your health care provider may also recommend using home test kits to check for hidden blood in the stool.  A small camera at the end of a tube can be used to examine your colon directly (sigmoidoscopy or colonoscopy). This is done to check for the earliest forms of colorectal cancer.  Routine screening usually begins at age 38.  Direct examination of the colon should be repeated every 5-10 years through 73 years of age. However, you may need to be screened more often if early forms of precancerous polyps or small growths are found.  Skin Cancer  Check  your skin from head to toe regularly.  Tell your health care provider about any new moles or changes in moles, especially if there is a change in a mole's shape or color.  Also tell your health care provider if you have a mole that is larger than the size of a pencil eraser.  Always use sunscreen. Apply sunscreen liberally and repeatedly throughout the day.  Protect yourself by wearing long sleeves, pants, a wide-brimmed hat, and sunglasses whenever you are outside.  Heart disease, diabetes, and high blood pressure  High blood pressure causes heart disease and increases the risk of stroke. High blood pressure is more likely to develop in: ? People who have blood pressure in the high end of the normal range (130-139/85-89 mm Hg). ? People who are overweight or obese. ? People who are African American.  If you are 29-59 years of age, have your blood pressure checked every 3-5 years. If you are 64 years of age or older, have your blood pressure checked every year. You should have your blood pressure measured twice-once when you are at a hospital or clinic, and once when you are not at a hospital or clinic. Record the average of the two measurements. To check your blood pressure when you are not at a hospital or clinic, you can use: ? An automated blood pressure machine at a pharmacy. ? A home blood pressure monitor.  If you are between 49 years and 24 years old, ask your health care provider if you should take aspirin to prevent strokes.  Have regular diabetes screenings. This involves taking a blood sample to check your fasting blood sugar level. ? If you are at a normal weight and have a low risk for diabetes, have this test once every three years after 73 years of age. ? If you are overweight and have a high risk for diabetes, consider being tested at a younger age or more often. Preventing infection Hepatitis B  If you have a higher risk for hepatitis B, you should be screened for this  virus. You are considered at high risk for hepatitis B if: ? You were born in a country where hepatitis B is common. Ask your health care provider which countries are considered high risk. ? Your parents were born in a high-risk country, and you have not been immunized against hepatitis B (hepatitis B vaccine). ? You have HIV or AIDS. ? You use needles to inject street  drugs. ? You live with someone who has hepatitis B. ? You have had sex with someone who has hepatitis B. ? You get hemodialysis treatment. ? You take certain medicines for conditions, including cancer, organ transplantation, and autoimmune conditions.  Hepatitis C  Blood testing is recommended for: ? Everyone born from [birth year] through 1967. ? Anyone with known risk factors for hepatitis C.  Sexually transmitted infections (STIs)  You should be screened for sexually transmitted infections (STIs) including gonorrhea and chlamydia if: ? You are sexually active and are younger than 73 years of age. ? You are older than 73 years of age and your health care provider tells you that you are at risk for this type of infection. ? Your sexual activity has changed since you were last screened and you are at an increased risk for chlamydia or gonorrhea. Ask your health care provider if you are at risk.  If you do not have HIV, but are at risk, it may be recommended that you take a prescription medicine daily to prevent HIV infection. This is called pre-exposure prophylaxis (PrEP). You are considered at risk if: ? You are sexually active and do not regularly use condoms or know the HIV status of your partner(s). ? You take drugs by injection. ? You are sexually active with a partner who has HIV.  Talk with your health care provider about whether you are at high risk of being infected with HIV. If you choose to begin PrEP, you should first be tested for HIV. You should then be tested every 3 months for as long as you are taking  PrEP. Pregnancy  If you are premenopausal and you may become pregnant, ask your health care provider about preconception counseling.  If you may become pregnant, take 400 to 800 micrograms (mcg) of folic acid every day.  If you want to prevent pregnancy, talk to your health care provider about birth control (contraception). Osteoporosis and menopause  Osteoporosis is a disease in which the bones lose minerals and strength with aging. This can result in serious bone fractures. Your risk for osteoporosis can be identified using a bone density scan.  If you are 6 years of age or older, or if you are at risk for osteoporosis and fractures, ask your health care provider if you should be screened.  Ask your health care provider whether you should take a calcium or vitamin D supplement to lower your risk for osteoporosis.  Menopause may have certain physical symptoms and risks.  Hormone replacement therapy may reduce some of these symptoms and risks. Talk to your health care provider about whether hormone replacement therapy is right for you. Follow these instructions at home:  Schedule regular health, dental, and eye exams.  Stay current with your immunizations.  Do not use any tobacco products including cigarettes, chewing tobacco, or electronic cigarettes.  If you are pregnant, do not drink alcohol.  If you are breastfeeding, limit how much and how often you drink alcohol.  Limit alcohol intake to no more than 1 drink per day for nonpregnant women. One drink equals 12 ounces of beer, 5 ounces of wine, or 1 ounces of hard liquor.  Do not use street drugs.  Do not share needles.  Ask your health care provider for help if you need support or information about quitting drugs.  Tell your health care provider if you often feel depressed.  Tell your health care provider if you have ever been abused or do not feel  safe at home. This information is not intended to replace advice given  to you by your health care provider. Make sure you discuss any questions you have with your health care provider. Document Released: 12/22/2010 Document Revised: 11/14/2015 Document Reviewed: 03/12/2015 Elsevier Interactive Patient Education  [employer].

## 2017-01-16 NOTE — Progress Notes (Signed)
Subjective:    Patient ID: Darlene Park, female    DOB: 1946/01/31, 71 y.o.   MRN: [id number]  HPI Here for medicare wellness exam and an annual physical exam.   I have personally reviewed and have noted 1.The patient's medical and social history 2.Their use of alcohol, tobacco or illicit drugs 3.Their current medications and supplements 4.The patient's functional ability including ADL's, fall risks, home safety risks and hearing or visual impairment. 5.Diet and physical activities 6.Evidence for depression or mood disorders 7.Care team reviewed - Eye doctor   Left knee:  Her knee is weak upon standing and it is painful especially going from sitting to standing. She uses the voltaren gel and it helps minimally.  She will apply a heating pad.  She has tried tylenol.    Are there smokers in your home (other than you)? No  Risk Factors Exercise:  Walking Dietary issues discussed: trying to do better, not always eating well balanced - has gained weight  Cardiac risk factors: advanced age, hypertension, and obesity (BMI >= 30 kg/m2).  Depression Screen  Have you felt down, depressed or hopeless? No  Have you felt little interest or pleasure in doing things?  No  Activities of Daily Living In your present state of health, do you have any difficulty performing the following activities?:  Driving? No Managing money?  No Feeding yourself? No Getting from bed to chair? No Climbing a flight of stairs? No Preparing food and eating?: No Bathing or showering? No Getting dressed: No Getting to/using the toilet? No Moving around from place to place: No In the past year have you fallen or had a near fall?: No   Are you sexually active?  No  Do you have more than one partner?  N/A  Hearing Difficulties: No Do you often ask people to speak up or repeat themselves? No Do you experience ringing or noises in your  ears? No Do you have difficulty understanding soft or whispered voices? No Vision:              Any change in vision:  no             Up to date with eye exam:   yes Memory:  Do you feel that you have a problem with memory? No  Do you often misplace items? No  Do you feel safe at home?  Yes  Cognitive Testing  Alert, Orientated? Yes  Normal Appearance? Yes  Recall of three objects?  Yes  Can perform simple calculations? Yes  Displays appropriate judgment? Yes  Can read the correct time from a watch face? Yes   Advanced Directives have been discussed with the patient? Yes   Medications and allergies reviewed with patient and updated if appropriate.  Patient Active Problem List   Diagnosis Date Noted  . Osteoarthritis of left knee   . Obese 09/25/2010  . Malignant neoplasm of cecum ([place]) 03/28/2009  . Vitamin D deficiency 11/29/2008  . Essential hypertension 11/14/2008  . URINARY INCONTINENCE 11/14/2008    Current Outpatient Prescriptions on File Prior to Visit  Medication Sig Dispense Refill  . Cholecalciferol 1000 UNIT capsule Take 1,000 Units by mouth daily.      . diclofenac sodium (VOLTAREN) 1 % GEL Apply 2 g topically 3 (three) times daily as needed. 100 g 1   No current facility-administered medications on file prior to visit.     Past Medical History:  Diagnosis Date  . CHICKENPOX, HX  OF 11/14/2008   Qualifier: Diagnosis of  By: Reatha Armour, Lucy    . GERD   . HYPERTENSION   . Malignant neoplasm of cecum ([place]) 03/28/2009   resection; No chemo/adj tx per onc  . Osteoarthritis of left knee   . URINARY INCONTINENCE   . VITAMIN D DEFICIENCY     Past Surgical History:  Procedure Laterality Date  . ABDOMINAL HYSTERECTOMY  1976   Partial precancer cells done @ [facility] in [place]  12/2008  . COLONOSCOPY    . HEMORRHOID SURGERY  1980's    Social History   Social History  . Marital status: Divorced    Spouse name: N/A  . Number of  children: N/A  . Years of education: N/A   Social History Main Topics  . Smoking status: Never Smoker  . Smokeless tobacco: Never Used  . Alcohol use 0.0 oz/week     Comment: socially  . Drug use: No  . Sexual activity: Not Asked   Other Topics Concern  . None   Social History Narrative   Retired from [employer] with daughter at her home    Family History  Problem Relation Age of Onset  . Adopted: Yes  . Hypertension Other   . Heart disease Other   . Prostate cancer Other   . Colon cancer Neg Hx   . Esophageal cancer Neg Hx   . Rectal cancer Neg Hx   . Stomach cancer Neg Hx     Review of Systems  Constitutional: Negative for chills, fatigue and fever.  HENT: Negative for hearing loss and tinnitus.   Eyes: Negative for visual disturbance.  Respiratory: Negative for cough, shortness of breath and wheezing.   Cardiovascular: Negative for chest pain, palpitations and leg swelling.  Gastrointestinal: Negative for abdominal pain, blood in stool, constipation, diarrhea and nausea.       No gerd  Genitourinary: Negative for dysuria and hematuria.  Musculoskeletal: Positive for arthralgias (left knee).  Skin: Negative for color change and rash.  Neurological: Negative for light-headedness and headaches.  Psychiatric/Behavioral: Negative for dysphoric mood. The patient is not nervous/anxious.        Objective:   Vitals:   01/18/17 0840  BP: (!) 148/80  Pulse: (!) 105  Resp: 16  Temp: 98 F (36.7 C)   Filed Weights   01/18/17 0840  Weight: 190 lb (86.2 kg)   Body mass index is 34.75 kg/m.  Wt Readings from Last 3 Encounters:  01/18/17 190 lb (86.2 kg)  01/13/16 193 lb (87.5 kg)  10/23/15 190 lb (86.2 kg)     Physical Exam Constitutional: She appears well-developed and well-nourished. No distress.  HENT:  Head: Normocephalic and atraumatic.  Right Ear: External ear normal. Normal ear canal and TM Left Ear: External ear normal.   Normal ear canal and TM Mouth/Throat: Oropharynx is clear and moist.  Eyes: Conjunctivae and EOM are normal.  Neck: Neck supple. No tracheal deviation present. No thyromegaly present.  No carotid bruit  Cardiovascular: Normal rate, regular rhythm and normal heart sounds.   No murmur heard.  No edema. Pulmonary/Chest: Effort normal and breath sounds normal. No respiratory distress. She has no wheezes. She has no rales.  Breast: deferred  Abdominal: Soft. She exhibits no distension. There is no tenderness.  Lymphadenopathy: She has no cervical adenopathy.  Skin: Skin is warm and dry. She is not diaphoretic.  Psychiatric: She has a normal  mood and affect. Her behavior is normal.         Assessment & Plan:   Wellness Exam: Immunizations  Up to date, discussed shingrix Colonoscopy  Up to date  Mammogram   - she will schedule Dexa - will schedule with mammogram - at [facility] exam   Up to date  Hearing loss   none Memory concerns/difficulties  No concerns regarding memory Independent of ADLs  Fully  Stressed the importance of regular exercise   Patient received copy of preventative screening tests/immunizations recommended for the next 5-10 years.   Physical exam:  Screening blood work  ordered Immunizations  Up to date, discussed shingrix Colonoscopy  Up to date  Mammogram   - will schedule Gyn - no longer sees gyn Dexa  - will schedule with mammo at [facility] exams   Up to date  EKG  - last EKG 2010- will do one today - Sinus rhythm at 78 bpm, low voltage otherwise normal.  No change compared to 2010 EKG Exercise - walking - will continue  Weight - advised weight loss Skin   No concerns Substance abuse  none  See Problem List for Assessment and Plan of chronic medical problems.   FU annually

## 2017-01-18 ENCOUNTER — Ambulatory Visit (INDEPENDENT_AMBULATORY_CARE_PROVIDER_SITE_OTHER): Payer: Medicare Other | Admitting: Internal Medicine

## 2017-01-18 ENCOUNTER — Encounter: Payer: Self-pay | Admitting: Internal Medicine

## 2017-01-18 ENCOUNTER — Other Ambulatory Visit (INDEPENDENT_AMBULATORY_CARE_PROVIDER_SITE_OTHER): Payer: Medicare Other

## 2017-01-18 VITALS — BP 148/80 | HR 105 | Temp 98.0°F | Resp 16 | Ht 62.0 in | Wt 190.0 lb

## 2017-01-18 DIAGNOSIS — C180 Malignant neoplasm of cecum: Secondary | ICD-10-CM | POA: Diagnosis not present

## 2017-01-18 DIAGNOSIS — M1712 Unilateral primary osteoarthritis, left knee: Secondary | ICD-10-CM | POA: Diagnosis not present

## 2017-01-18 DIAGNOSIS — I10 Essential (primary) hypertension: Secondary | ICD-10-CM | POA: Diagnosis not present

## 2017-01-18 DIAGNOSIS — Z0000 Encounter for general adult medical examination without abnormal findings: Secondary | ICD-10-CM

## 2017-01-18 LAB — CBC WITH DIFFERENTIAL/PLATELET
BASOS ABS: 0.1 10*3/uL (ref 0.0–0.1)
BASOS PCT: 1.4 % (ref 0.0–3.0)
Eosinophils Absolute: 0.4 10*3/uL (ref 0.0–0.7)
Eosinophils Relative: 7.1 % — ABNORMAL HIGH (ref 0.0–5.0)
HEMATOCRIT: 36.1 % (ref 36.0–46.0)
Hemoglobin: 12.3 g/dL (ref 12.0–15.0)
LYMPHS ABS: 1.8 10*3/uL (ref 0.7–4.0)
Lymphocytes Relative: 28.6 % (ref 12.0–46.0)
MCHC: 34 g/dL (ref 30.0–36.0)
MCV: 89.3 fl (ref 78.0–100.0)
MONOS PCT: 7.5 % (ref 3.0–12.0)
Monocytes Absolute: 0.5 10*3/uL (ref 0.1–1.0)
NEUTROS ABS: 3.5 10*3/uL (ref 1.4–7.7)
NEUTROS PCT: 55.4 % (ref 43.0–77.0)
PLATELETS: 306 10*3/uL (ref 150.0–400.0)
RBC: 4.04 Mil/uL (ref 3.87–5.11)
RDW: 13.8 % (ref 11.5–15.5)
WBC: 6.3 10*3/uL (ref 4.0–10.5)

## 2017-01-18 LAB — COMPREHENSIVE METABOLIC PANEL
ALT: 17 U/L (ref 0–35)
AST: 22 U/L (ref 0–37)
Albumin: 4.2 g/dL (ref 3.5–5.2)
Alkaline Phosphatase: 96 U/L (ref 39–117)
BUN: 16 mg/dL (ref 6–23)
CHLORIDE: 103 meq/L (ref 96–112)
CO2: 27 mEq/L (ref 19–32)
Calcium: 9.5 mg/dL (ref 8.4–10.5)
Creatinine, Ser: 1.11 mg/dL (ref 0.40–1.20)
GFR: 51.2 mL/min — ABNORMAL LOW (ref >60.00)
GLUCOSE: 89 mg/dL (ref 70–99)
POTASSIUM: 3.7 meq/L (ref 3.5–5.1)
SODIUM: 139 meq/L (ref 135–145)
Total Bilirubin: 0.4 mg/dL (ref 0.2–1.2)
Total Protein: 7.8 g/dL (ref 6.0–8.3)

## 2017-01-18 LAB — LIPID PANEL
CHOLESTEROL: 186 mg/dL (ref 0–200)
HDL: 44.7 mg/dL (ref >39.00)
LDL CALC: 115 mg/dL — AB (ref 0–99)
NonHDL: 141.75
Total CHOL/HDL Ratio: 4
Triglycerides: 135 mg/dL (ref 0.0–149.0)
VLDL: 27 mg/dL (ref 0.0–40.0)

## 2017-01-18 LAB — TSH: TSH: 0.8 u[IU]/mL (ref 0.35–4.50)

## 2017-01-18 MED ORDER — HYDROCHLOROTHIAZIDE 25 MG PO TABS
25.0000 mg | ORAL_TABLET | Freq: Every day | ORAL | 3 refills | Status: DC
Start: 2017-01-18 — End: 2018-01-17

## 2017-01-18 MED ORDER — OLMESARTAN MEDOXOMIL 20 MG PO TABS: 20.0000 mg | ORAL_TABLET | Freq: Every day | ORAL | 3 refills | Status: DC

## 2017-01-18 NOTE — Assessment & Plan Note (Addendum)
Increased pain - start tylenol - take regularly - up to 3000 mg daily Continue voltaren gel Deferred ortho/sports med referral Continue regular exercise - walking

## 2017-01-18 NOTE — Assessment & Plan Note (Signed)
Has repeat colonoscopy this year No concerning symptoms

## 2017-01-18 NOTE — Assessment & Plan Note (Addendum)
BP 130's mostly at home Discussed goal of less than 140/90 Continue current medications cmp Advised to hold medication if she has a illness and becomes dehydrated - advised her to call us if this occurs

## 2017-05-06 ENCOUNTER — Ambulatory Visit (INDEPENDENT_AMBULATORY_CARE_PROVIDER_SITE_OTHER): Payer: Medicare Other | Admitting: *Deleted

## 2017-05-06 DIAGNOSIS — Z23 Encounter for immunization: Secondary | ICD-10-CM

## 2017-06-24 ENCOUNTER — Encounter: Payer: Self-pay | Admitting: Gastroenterology

## 2017-07-13 ENCOUNTER — Telehealth: Payer: Self-pay | Admitting: Internal Medicine

## 2017-07-13 DIAGNOSIS — M1712 Unilateral primary osteoarthritis, left knee: Secondary | ICD-10-CM

## 2017-07-13 DIAGNOSIS — I10 Essential (primary) hypertension: Secondary | ICD-10-CM

## 2017-07-13 MED ORDER — OLMESARTAN MEDOXOMIL 20 MG PO TABS: 20.0000 mg | ORAL_TABLET | Freq: Every day | ORAL | 1 refills | Status: DC

## 2017-07-13 MED ORDER — DICLOFENAC SODIUM 1 % TD GEL: 2.0000 g | Freq: Three times a day (TID) | TRANSDERMAL | 1 refills | Status: AC | PRN

## 2017-07-13 NOTE — Telephone Encounter (Signed)
Copied from [place]. Topic: Quick Communication - See Telephone Encounter >> Jul 13, 2017 11:54 AM Aurelio Brash B wrote: CRM for notification. See Telephone encounter for:  Pt is asking for refill of  olmesartan (BENICAR) 20 MG tablet   but she wants GENERIC FORM  And   diclofenac sodium (VOLTAREN) 1 % GEL  [id number] [address], [place] [phone] (Phone) [phone] (Fax)    07/13/17.

## 2017-07-13 NOTE — Telephone Encounter (Signed)
Reviewed chart pt is up-to-date sent refills to [facility].../lmb  

## 2017-10-19 ENCOUNTER — Telehealth: Payer: Self-pay | Admitting: Internal Medicine

## 2017-10-19 DIAGNOSIS — I10 Essential (primary) hypertension: Secondary | ICD-10-CM

## 2017-10-19 MED ORDER — BENICAR 20 MG PO TABS: 20.0000 mg | ORAL_TABLET | Freq: Every day | ORAL | 0 refills | Status: DC

## 2017-10-19 NOTE — Telephone Encounter (Signed)
[facility]

## 2017-10-19 NOTE — Telephone Encounter (Signed)
Copied from [place] [id number]. Topic: Quick Communication - Rx Refill/Question >> Oct 19, 2017 10:07 AM Margot Ables wrote: Medication: pt would like to go back to the brand name Benicar. She will pay the difference. She doesn't feel like the generic is working well for her. Pt will be out on Sunday. Pt prefers 3 month supply of medication. Please advise. Has the patient contacted their pharmacy? no Preferred Pharmacy (with phone number or street name): [id number] [address], [place] - [place] [phone] (Phone) [phone] (Fax)

## 2018-01-17 ENCOUNTER — Other Ambulatory Visit: Payer: Self-pay | Admitting: Internal Medicine

## 2018-01-18 NOTE — Progress Notes (Signed)
Subjective:    Patient ID: Darlene Park, female    DOB: 06-12-1946, 72 y.o.   MRN: [id number]  HPI She is here for a physical exam.   She is having some posterior neck pain - often worse on the right.  She has occasional popping.  She has not tried tylenol or anything else for it.  She does have knee OA. She denies regular headaches.   Medications and allergies reviewed with patient and updated if appropriate.  Patient Active Problem List   Diagnosis Date Noted  . Osteoarthritis of left knee   . Obese 09/25/2010  . Malignant neoplasm of cecum ([place]) 03/28/2009  . Vitamin D deficiency 11/29/2008  . Essential hypertension 11/14/2008  . URINARY INCONTINENCE 11/14/2008    Current Outpatient Medications on File Prior to Visit  Medication Sig Dispense Refill  . BENICAR 20 MG tablet Take 1 tablet (20 mg total) by mouth daily. 90 tablet 0  . Cholecalciferol 1000 UNIT capsule Take 1,000 Units by mouth daily.      . diclofenac sodium (VOLTAREN) 1 % GEL Apply 2 g topically 3 (three) times daily as needed. 100 g 1  . hydrochlorothiazide (HYDRODIURIL) 25 MG tablet TAKE 1 TABLET BY MOUTH EVERY DAY 90 tablet 0   No current facility-administered medications on file prior to visit.     Past Medical History:  Diagnosis Date  . CHICKENPOX, HX OF 11/14/2008   Qualifier: Diagnosis of  By: Marca Ancona RMA, Lucy    . GERD   . HYPERTENSION   . Malignant neoplasm of cecum ([place]) 03/28/2009   resection; No chemo/adj tx per onc  . Osteoarthritis of left knee   . URINARY INCONTINENCE   . VITAMIN D DEFICIENCY     Past Surgical History:  Procedure Laterality Date  . ABDOMINAL HYSTERECTOMY  1976   Partial precancer cells done @ [facility] in [place]  12/2008  . COLONOSCOPY    . HEMORRHOID SURGERY  1980's    Social History   Socioeconomic History  . Marital status: Divorced    Spouse name: Not on file  . Number of children: Not on file  . Years of education: Not  on file  . Highest education level: Not on file  Occupational History  . Not on file  Social Needs  . Financial resource strain: Not on file  . Food insecurity:    Worry: Not on file    Inability: Not on file  . Transportation needs:    Medical: Not on file    Non-medical: Not on file  Tobacco Use  . Smoking status: Never Smoker  . Smokeless tobacco: Never Used  Substance and Sexual Activity  . Alcohol use: Yes    Alcohol/week: 0.0 oz    Comment: socially  . Drug use: No  . Sexual activity: Not on file  Lifestyle  . Physical activity:    Days per week: Not on file    Minutes per session: Not on file  . Stress: Not on file  Relationships  . Social connections:    Talks on phone: Not on file    Gets together: Not on file    Attends religious service: Not on file    Active member of club or organization: Not on file    Attends meetings of clubs or organizations: Not on file    Relationship status: Not on file  Other Topics Concern  . Not on file  Social  History Narrative   Retired from [employer] with daughter at her home    Family History  Adopted: Yes  Problem Relation Age of Onset  . Hypertension Other   . Heart disease Other   . Prostate cancer Other   . Colon cancer Neg Hx   . Esophageal cancer Neg Hx   . Rectal cancer Neg Hx   . Stomach cancer Neg Hx     Review of Systems  Constitutional: Negative for chills, fatigue and fever.  Eyes: Negative for visual disturbance.  Respiratory: Negative for cough, shortness of breath and wheezing.   Cardiovascular: Negative for chest pain, palpitations and leg swelling.  Gastrointestinal: Negative for abdominal pain, blood in stool, constipation, diarrhea and nausea.       No gerd  Musculoskeletal: Positive for arthralgias (knees) and neck pain (likely OA).  Skin: Negative for color change and rash.  Neurological: Negative for dizziness, light-headedness and headaches.  Psychiatric/Behavioral:  Negative for dysphoric mood. The patient is not nervous/anxious.        Objective:   Vitals:   01/19/18 0810  BP: 118/60  Pulse: 78  SpO2: 98%   Filed Weights   01/19/18 0810  Weight: 184 lb (83.5 kg)   Body mass index is 35.34 kg/m.  Wt Readings from Last 3 Encounters:  01/19/18 184 lb (83.5 kg)  01/18/17 190 lb (86.2 kg)  01/13/16 193 lb (87.5 kg)     Physical Exam Constitutional: She appears well-developed and well-nourished. No distress.  HENT:  Head: Normocephalic and atraumatic.  Right Ear: External ear normal. Normal ear canal and TM Left Ear: External ear normal.  Normal ear canal and TM Mouth/Throat: Oropharynx is clear and moist.  Eyes: Conjunctivae and EOM are normal.  Neck: Neck supple. No tracheal deviation present. No thyromegaly present.  No carotid bruit  Cardiovascular: Normal rate, regular rhythm and normal heart sounds.   No murmur heard.  No edema. Pulmonary/Chest: Effort normal and breath sounds normal. No respiratory distress. She has no wheezes. She has no rales.  Breast: deferred  Abdominal: Soft. She exhibits no distension. There is no tenderness.  Lymphadenopathy: She has no cervical adenopathy.  Skin: Skin is warm and dry. She is not diaphoretic.  Psychiatric: She has a normal mood and affect. Her behavior is normal.        Assessment & Plan:   Physical exam: Screening blood work  ordered Immunizations   Discussed shingles, others up to date Colonoscopy    Last done 05/2012 - overdue (05/2017) - she plans on scheduling Mammogram   - overdue -- will schedule at [facility] -  No longer sees [facility]   Never done -- will schedule at [facility] exams   Up to date  EKG    Done 12/2016 Exercise - some walking - not as much as she should - stressed regular exercise Weight   - obese - advised weight loss Skin  - no concerns Substance abuse    none  See Problem List for Assessment and Plan of chronic medical problems.    FU in one  year

## 2018-01-18 NOTE — Patient Instructions (Addendum)
Schedule your mammogram and bone density at [facility] - Phone: [phone]  Schedule your colonoscopy.  Test(s) ordered today. Your results will be released to [facility] (or called to you) after review, usually within 72hours after test completion. If any changes need to be made, you will be notified at that same time.  All other Health Maintenance issues reviewed.   All recommended immunizations and age-appropriate screenings are up-to-date or discussed.  No immunizations administered today.   Medications reviewed and updated.  No changes recommended at this time.  Your prescription(s) have been submitted to your pharmacy. Please take as directed and contact our office if you believe you are having problem(s) with the medication(s).  Please followup in one year   Health Maintenance, Female Adopting a healthy lifestyle and getting preventive care can go a long way to promote health and wellness. Talk with your health care provider about what schedule of regular examinations is right for you. This is a good chance for you to check in with your provider about disease prevention and staying healthy. In between checkups, there are plenty of things you can do on your own. Experts have done a lot of research about which lifestyle changes and preventive measures are most likely to keep you healthy. Ask your health care provider for more information. Weight and diet Eat a healthy diet  Be sure to include plenty of vegetables, fruits, low-fat dairy products, and lean protein.  Do not eat a lot of foods high in solid fats, added sugars, or salt.  Get regular exercise. This is one of the most important things you can do for your health. ? Most adults should exercise for at least 150 minutes each week. The exercise should increase your heart rate and make you sweat (moderate-intensity exercise). ? Most adults should also do strengthening exercises at least twice a week. This is in addition to the  moderate-intensity exercise.  Maintain a healthy weight  Body mass index (BMI) is a measurement that can be used to identify possible weight problems. It estimates body fat based on height and weight. Your health care provider can help determine your BMI and help you achieve or maintain a healthy weight.  For females 19 years of age and older: ? A BMI below 18.5 is considered underweight. ? A BMI of 18.5 to 24.9 is normal. ? A BMI of 25 to 29.9 is considered overweight. ? A BMI of 30 and above is considered obese.  Watch levels of cholesterol and blood lipids  You should start having your blood tested for lipids and cholesterol at 75 years of age, then have this test every 5 years.  You may need to have your cholesterol levels checked more often if: ? Your lipid or cholesterol levels are high. ? You are older than 74 years of age. ? You are at high risk for heart disease.  Cancer screening Lung Cancer  Lung cancer screening is recommended for adults 53-13 years old who are at high risk for lung cancer because of a history of smoking.  A yearly low-dose CT scan of the lungs is recommended for people who: ? Currently smoke. ? Have quit within the past 15 years. ? Have at least a 30-pack-year history of smoking. A pack year is smoking an average of one pack of cigarettes a day for 1 year.  Yearly screening should continue until it has been 15 years since you quit.  Yearly screening should stop if you develop a health problem  that would prevent you from having lung cancer treatment.  Breast Cancer  Practice breast self-awareness. This means understanding how your breasts normally appear and feel.  It also means doing regular breast self-exams. Let your health care provider know about any changes, no matter how small.  If you are in your 20s or 30s, you should have a clinical breast exam (CBE) by a health care provider every 1-3 years as part of a regular health exam.  If you  are 64 or older, have a CBE every year. Also consider having a breast X-ray (mammogram) every year.  If you have a family history of breast cancer, talk to your health care provider about genetic screening.  If you are at high risk for breast cancer, talk to your health care provider about having an MRI and a mammogram every year.  Breast cancer gene (BRCA) assessment is recommended for women who have family members with BRCA-related cancers. BRCA-related cancers include: ? Breast. ? Ovarian. ? Tubal. ? Peritoneal cancers.  Results of the assessment will determine the need for genetic counseling and BRCA1 and BRCA2 testing.  Cervical Cancer Your health care provider may recommend that you be screened regularly for cancer of the pelvic organs (ovaries, uterus, and vagina). This screening involves a pelvic examination, including checking for microscopic changes to the surface of your cervix (Pap test). You may be encouraged to have this screening done every 3 years, beginning at age 31.  For women ages 33-65, health care providers may recommend pelvic exams and Pap testing every 3 years, or they may recommend the Pap and pelvic exam, combined with testing for human papilloma virus (HPV), every 5 years. Some types of HPV increase your risk of cervical cancer. Testing for HPV may also be done on women of any age with unclear Pap test results.  Other health care providers may not recommend any screening for nonpregnant women who are considered low risk for pelvic cancer and who do not have symptoms. Ask your health care provider if a screening pelvic exam is right for you.  If you have had past treatment for cervical cancer or a condition that could lead to cancer, you need Pap tests and screening for cancer for at least 20 years after your treatment. If Pap tests have been discontinued, your risk factors (such as having a new sexual partner) need to be reassessed to determine if screening should  resume. Some women have medical problems that increase the chance of getting cervical cancer. In these cases, your health care provider may recommend more frequent screening and Pap tests.  Colorectal Cancer  This type of cancer can be detected and often prevented.  Routine colorectal cancer screening usually begins at 74 years of age and continues through 74 years of age.  Your health care provider may recommend screening at an earlier age if you have risk factors for colon cancer.  Your health care provider may also recommend using home test kits to check for hidden blood in the stool.  A small camera at the end of a tube can be used to examine your colon directly (sigmoidoscopy or colonoscopy). This is done to check for the earliest forms of colorectal cancer.  Routine screening usually begins at age 40.  Direct examination of the colon should be repeated every 5-10 years through 74 years of age. However, you may need to be screened more often if early forms of precancerous polyps or small growths are found.  Skin Cancer  Check your skin from head to toe regularly.  Tell your health care provider about any new moles or changes in moles, especially if there is a change in a mole's shape or color.  Also tell your health care provider if you have a mole that is larger than the size of a pencil eraser.  Always use sunscreen. Apply sunscreen liberally and repeatedly throughout the day.  Protect yourself by wearing long sleeves, pants, a wide-brimmed hat, and sunglasses whenever you are outside.  Heart disease, diabetes, and high blood pressure  High blood pressure causes heart disease and increases the risk of stroke. High blood pressure is more likely to develop in: ? People who have blood pressure in the high end of the normal range (130-139/85-89 mm Hg). ? People who are overweight or obese. ? People who are African American.  If you are 16-30 years of age, have your blood  pressure checked every 3-5 years. If you are 13 years of age or older, have your blood pressure checked every year. You should have your blood pressure measured twice-once when you are at a hospital or clinic, and once when you are not at a hospital or clinic. Record the average of the two measurements. To check your blood pressure when you are not at a hospital or clinic, you can use: ? An automated blood pressure machine at a pharmacy. ? A home blood pressure monitor.  If you are between 81 years and 24 years old, ask your health care provider if you should take aspirin to prevent strokes.  Have regular diabetes screenings. This involves taking a blood sample to check your fasting blood sugar level. ? If you are at a normal weight and have a low risk for diabetes, have this test once every three years after 74 years of age. ? If you are overweight and have a high risk for diabetes, consider being tested at a younger age or more often. Preventing infection Hepatitis B  If you have a higher risk for hepatitis B, you should be screened for this virus. You are considered at high risk for hepatitis B if: ? You were born in a country where hepatitis B is common. Ask your health care provider which countries are considered high risk. ? Your parents were born in a high-risk country, and you have not been immunized against hepatitis B (hepatitis B vaccine). ? You have HIV or AIDS. ? You use needles to inject street drugs. ? You live with someone who has hepatitis B. ? You have had sex with someone who has hepatitis B. ? You get hemodialysis treatment. ? You take certain medicines for conditions, including cancer, organ transplantation, and autoimmune conditions.  Hepatitis C  Blood testing is recommended for: ? Everyone born from [birth year] through 1967. ? Anyone with known risk factors for hepatitis C.  Sexually transmitted infections (STIs)  You should be screened for sexually transmitted  infections (STIs) including gonorrhea and chlamydia if: ? You are sexually active and are younger than 74 years of age. ? You are older than 74 years of age and your health care provider tells you that you are at risk for this type of infection. ? Your sexual activity has changed since you were last screened and you are at an increased risk for chlamydia or gonorrhea. Ask your health care provider if you are at risk.  If you do not have HIV, but are at risk, it may be recommended that you take a prescription  medicine daily to prevent HIV infection. This is called pre-exposure prophylaxis (PrEP). You are considered at risk if: ? You are sexually active and do not regularly use condoms or know the HIV status of your partner(s). ? You take drugs by injection. ? You are sexually active with a partner who has HIV.  Talk with your health care provider about whether you are at high risk of being infected with HIV. If you choose to begin PrEP, you should first be tested for HIV. You should then be tested every 3 months for as long as you are taking PrEP. Pregnancy  If you are premenopausal and you may become pregnant, ask your health care provider about preconception counseling.  If you may become pregnant, take 400 to 800 micrograms (mcg) of folic acid every day.  If you want to prevent pregnancy, talk to your health care provider about birth control (contraception). Osteoporosis and menopause  Osteoporosis is a disease in which the bones lose minerals and strength with aging. This can result in serious bone fractures. Your risk for osteoporosis can be identified using a bone density scan.  If you are 14 years of age or older, or if you are at risk for osteoporosis and fractures, ask your health care provider if you should be screened.  Ask your health care provider whether you should take a calcium or vitamin D supplement to lower your risk for osteoporosis.  Menopause may have certain physical  symptoms and risks.  Hormone replacement therapy may reduce some of these symptoms and risks. Talk to your health care provider about whether hormone replacement therapy is right for you. Follow these instructions at home:  Schedule regular health, dental, and eye exams.  Stay current with your immunizations.  Do not use any tobacco products including cigarettes, chewing tobacco, or electronic cigarettes.  If you are pregnant, do not drink alcohol.  If you are breastfeeding, limit how much and how often you drink alcohol.  Limit alcohol intake to no more than 1 drink per day for nonpregnant women. One drink equals 12 ounces of beer, 5 ounces of wine, or 1 ounces of hard liquor.  Do not use street drugs.  Do not share needles.  Ask your health care provider for help if you need support or information about quitting drugs.  Tell your health care provider if you often feel depressed.  Tell your health care provider if you have ever been abused or do not feel safe at home. This information is not intended to replace advice given to you by your health care provider. Make sure you discuss any questions you have with your health care provider. Document Released: 12/22/2010 Document Revised: 11/14/2015 Document Reviewed: 03/12/2015 Elsevier Interactive Patient Education  [employer].

## 2018-01-19 ENCOUNTER — Ambulatory Visit (INDEPENDENT_AMBULATORY_CARE_PROVIDER_SITE_OTHER): Payer: Medicare Other | Admitting: Internal Medicine

## 2018-01-19 ENCOUNTER — Encounter: Payer: Self-pay | Admitting: Internal Medicine

## 2018-01-19 ENCOUNTER — Other Ambulatory Visit (INDEPENDENT_AMBULATORY_CARE_PROVIDER_SITE_OTHER): Payer: Medicare Other

## 2018-01-19 VITALS — BP 118/60 | HR 78 | Ht 60.5 in | Wt 184.0 lb

## 2018-01-19 DIAGNOSIS — M542 Cervicalgia: Secondary | ICD-10-CM | POA: Diagnosis not present

## 2018-01-19 DIAGNOSIS — Z6835 Body mass index (BMI) 35.0-35.9, adult: Secondary | ICD-10-CM

## 2018-01-19 DIAGNOSIS — I10 Essential (primary) hypertension: Secondary | ICD-10-CM

## 2018-01-19 DIAGNOSIS — E559 Vitamin D deficiency, unspecified: Secondary | ICD-10-CM | POA: Diagnosis not present

## 2018-01-19 DIAGNOSIS — Z0000 Encounter for general adult medical examination without abnormal findings: Secondary | ICD-10-CM | POA: Diagnosis not present

## 2018-01-19 DIAGNOSIS — M1712 Unilateral primary osteoarthritis, left knee: Secondary | ICD-10-CM

## 2018-01-19 DIAGNOSIS — C180 Malignant neoplasm of cecum: Secondary | ICD-10-CM

## 2018-01-19 LAB — CBC WITH DIFFERENTIAL/PLATELET
BASOS PCT: 0.6 % (ref 0.0–3.0)
Basophils Absolute: 0 10*3/uL (ref 0.0–0.1)
EOS PCT: 5 % (ref 0.0–5.0)
Eosinophils Absolute: 0.4 10*3/uL (ref 0.0–0.7)
HEMATOCRIT: 34.7 % — AB (ref 36.0–46.0)
HEMOGLOBIN: 11.9 g/dL — AB (ref 12.0–15.0)
LYMPHS PCT: 25.6 % (ref 12.0–46.0)
Lymphs Abs: 1.8 10*3/uL (ref 0.7–4.0)
MCHC: 34.2 g/dL (ref 30.0–36.0)
MCV: 88.1 fl (ref 78.0–100.0)
Monocytes Absolute: 0.5 10*3/uL (ref 0.1–1.0)
Monocytes Relative: 7 % (ref 3.0–12.0)
Neutro Abs: 4.4 10*3/uL (ref 1.4–7.7)
Neutrophils Relative %: 61.8 % (ref 43.0–77.0)
Platelets: 311 10*3/uL (ref 150.0–400.0)
RBC: 3.94 Mil/uL (ref 3.87–5.11)
RDW: 13.8 % (ref 11.5–15.5)
WBC: 7.1 10*3/uL (ref 4.0–10.5)

## 2018-01-19 LAB — TSH: TSH: 0.84 u[IU]/mL (ref 0.35–4.50)

## 2018-01-19 LAB — LIPID PANEL
CHOL/HDL RATIO: 4
CHOLESTEROL: 196 mg/dL (ref 0–200)
HDL: 45.9 mg/dL (ref >39.00)
LDL CALC: 127 mg/dL — AB (ref 0–99)
NonHDL: 150.13
Triglycerides: 114 mg/dL (ref 0.0–149.0)
VLDL: 22.8 mg/dL (ref 0.0–40.0)

## 2018-01-19 LAB — COMPREHENSIVE METABOLIC PANEL
ALK PHOS: 99 U/L (ref 39–117)
ALT: 10 U/L (ref 0–35)
AST: 16 U/L (ref 0–37)
Albumin: 4.2 g/dL (ref 3.5–5.2)
BUN: 17 mg/dL (ref 6–23)
CALCIUM: 9.7 mg/dL (ref 8.4–10.5)
CHLORIDE: 102 meq/L (ref 96–112)
CO2: 26 mEq/L (ref 19–32)
Creatinine, Ser: 1.18 mg/dL (ref 0.40–1.20)
GFR: 47.58 mL/min — AB (ref >60.00)
Glucose, Bld: 90 mg/dL (ref 70–99)
POTASSIUM: 4.1 meq/L (ref 3.5–5.1)
SODIUM: 137 meq/L (ref 135–145)
TOTAL PROTEIN: 7.7 g/dL (ref 6.0–8.3)
Total Bilirubin: 0.3 mg/dL (ref 0.2–1.2)

## 2018-01-19 MED ORDER — BENICAR 20 MG PO TABS: 20.0000 mg | ORAL_TABLET | Freq: Every day | ORAL | 3 refills | Status: DC

## 2018-01-19 MED ORDER — HYDROCHLOROTHIAZIDE 25 MG PO TABS: 25.0000 mg | ORAL_TABLET | Freq: Every day | ORAL | 2 refills | Status: DC

## 2018-01-19 NOTE — Assessment & Plan Note (Signed)
Taking vitamin d daily 

## 2018-01-19 NOTE — Assessment & Plan Note (Signed)
Likely related to arthritis Try tylenol and voltaren gel If pain persists will need further evaluation

## 2018-01-19 NOTE — Assessment & Plan Note (Signed)
Controlled with tylenol and voltaren gel prn Increase exercise Work on weight loss

## 2018-01-19 NOTE — Assessment & Plan Note (Signed)
Stressed regular exercise, healthy diet and decreased portions Discussed importance of weight loss

## 2018-01-19 NOTE — Assessment & Plan Note (Signed)
Colonoscopy overdue Stressed getting this done asap - she knows she needs to have it done and will call to schedule

## 2018-01-19 NOTE — Assessment & Plan Note (Signed)
BP well controlled Current regimen effective and well tolerated Continue current medications at current doses -- requires trade Benicar - generic did not control her BP and she did not feel well on the generics Cmp, tsh

## 2018-06-13 ENCOUNTER — Ambulatory Visit (INDEPENDENT_AMBULATORY_CARE_PROVIDER_SITE_OTHER): Payer: Medicare Other

## 2018-06-13 DIAGNOSIS — Z23 Encounter for immunization: Secondary | ICD-10-CM | POA: Diagnosis not present

## 2018-07-17 ENCOUNTER — Other Ambulatory Visit: Payer: Self-pay | Admitting: Internal Medicine

## 2018-07-17 DIAGNOSIS — I10 Essential (primary) hypertension: Secondary | ICD-10-CM

## 2019-01-16 ENCOUNTER — Other Ambulatory Visit: Payer: Self-pay | Admitting: Internal Medicine

## 2019-01-17 ENCOUNTER — Other Ambulatory Visit: Payer: Self-pay | Admitting: Internal Medicine

## 2019-01-17 DIAGNOSIS — I10 Essential (primary) hypertension: Secondary | ICD-10-CM

## 2019-02-10 ENCOUNTER — Other Ambulatory Visit: Payer: Self-pay | Admitting: Internal Medicine

## 2019-02-16 NOTE — Progress Notes (Signed)
Subjective:    Patient ID: Darlene Park, female    DOB: Oct 24, 1945, 73 y.o.   MRN: [id number]  HPI The patient is here for follow up.  She is exercising regularly minimally.     Hypertension: She is taking her medication daily. She is compliant with a low sodium diet.  She denies chest pain, palpitations, edema, shortness of breath and regular headaches. She does not monitor her blood pressure at home.    H/o colon cancer:  She is overdue for a colonoscopy.  She knows she needs to call up and schedule an appointment.  She declined a referral to have them call her.  Stressed the importance of calling them to schedule colonoscopy.   Left knee arthritis: She is using the Voltaren gel and taking Tylenol.  This is controlling her pain.  She does exercise some, but she is limited because of the knee pain.  She is not interested in ever having surgery.  Medications and allergies reviewed with patient and updated if appropriate.  Patient Active Problem List   Diagnosis Date Noted  . Neck pain 01/19/2018  . Osteoarthritis of left knee   . Obese 09/25/2010  . Malignant neoplasm of cecum ([place]) 03/28/2009  . Vitamin D deficiency 11/29/2008  . Essential hypertension 11/14/2008  . URINARY INCONTINENCE 11/14/2008    Current Outpatient Medications on File Prior to Visit  Medication Sig Dispense Refill  . Cholecalciferol 1000 UNIT capsule Take 1,000 Units by mouth daily.      . diclofenac sodium (VOLTAREN) 1 % GEL Apply 2 g topically 3 (three) times daily as needed. 100 g 1  . hydrochlorothiazide (HYDRODIURIL) 25 MG tablet Take 1 tablet (25 mg total) by mouth daily. Needs office visit for more refills 30 tablet 0  . olmesartan (BENICAR) 20 MG tablet Take 1 tablet (20 mg total) by mouth daily. -- Office visit needed for further refills 90 tablet 0   No current facility-administered medications on file prior to visit.     Past Medical History:  Diagnosis Date  . CHICKENPOX, HX OF  11/14/2008   Qualifier: Diagnosis of  By: Marca Ancona RMA, Lucy    . GERD   . HYPERTENSION   . Malignant neoplasm of cecum ([place]) 03/28/2009   resection; No chemo/adj tx per onc  . Osteoarthritis of left knee   . URINARY INCONTINENCE   . VITAMIN D DEFICIENCY     Past Surgical History:  Procedure Laterality Date  . ABDOMINAL HYSTERECTOMY  1976   Partial precancer cells done @ [facility] in [place]  12/2008  . COLONOSCOPY    . HEMORRHOID SURGERY  1980's    Social History   Socioeconomic History  . Marital status: Divorced    Spouse name: Not on file  . Number of children: Not on file  . Years of education: Not on file  . Highest education level: Not on file  Occupational History  . Not on file  Social Needs  . Financial resource strain: Not on file  . Food insecurity    Worry: Not on file    Inability: Not on file  . Transportation needs    Medical: Not on file    Non-medical: Not on file  Tobacco Use  . Smoking status: Never Smoker  . Smokeless tobacco: Never Used  Substance and Sexual Activity  . Alcohol use: Yes    Alcohol/week: 0.0 standard drinks    Comment: socially  .  Drug use: No  . Sexual activity: Not on file  Lifestyle  . Physical activity    Days per week: Not on file    Minutes per session: Not on file  . Stress: Not on file  Relationships  . Social Herbalist on phone: Not on file    Gets together: Not on file    Attends religious service: Not on file    Active member of club or organization: Not on file    Attends meetings of clubs or organizations: Not on file    Relationship status: Not on file  Other Topics Concern  . Not on file  Social History Narrative   Retired from [employer] with daughter at her home    Family History  Adopted: Yes  Problem Relation Age of Onset  . Hypertension Other   . Heart disease Other   . Prostate cancer Other   . Colon cancer Neg Hx   . Esophageal  cancer Neg Hx   . Rectal cancer Neg Hx   . Stomach cancer Neg Hx     Review of Systems  Constitutional: Negative for chills and fever.  Respiratory: Negative for cough, shortness of breath and wheezing.   Cardiovascular: Negative for chest pain, palpitations and leg swelling.  Musculoskeletal: Positive for arthralgias (left knee).  Neurological: Negative for dizziness, light-headedness and headaches.       Objective:   Vitals:   02/17/19 1354  BP: 122/80  Pulse: 78  Temp: 98.5 F (36.9 C)  SpO2: 99%   BP Readings from Last 3 Encounters:  02/17/19 122/80  01/19/18 118/60  01/18/17 (!) 148/80   Wt Readings from Last 3 Encounters:  02/17/19 184 lb (83.5 kg)  01/19/18 184 lb (83.5 kg)  01/18/17 190 lb (86.2 kg)   Body mass index is 35.34 kg/m.   Physical Exam    Constitutional: Appears well-developed and well-nourished. No distress.  HENT:  Head: Normocephalic and atraumatic.  Neck: Neck supple. No tracheal deviation present. No thyromegaly present.  No cervical lymphadenopathy Cardiovascular: Normal rate, regular rhythm and normal heart sounds.   No murmur heard. No carotid bruit .  No edema Pulmonary/Chest: Effort normal and breath sounds normal. No respiratory distress. No has no wheezes. No rales.  Skin: Skin is warm and dry. Not diaphoretic.  Psychiatric: Normal mood and affect. Behavior is normal.      Assessment & Plan:    See Problem List for Assessment and Plan of chronic medical problems.

## 2019-02-17 ENCOUNTER — Other Ambulatory Visit: Payer: Self-pay

## 2019-02-17 ENCOUNTER — Ambulatory Visit (INDEPENDENT_AMBULATORY_CARE_PROVIDER_SITE_OTHER): Payer: Medicare Other | Admitting: Internal Medicine

## 2019-02-17 ENCOUNTER — Encounter: Payer: Self-pay | Admitting: Internal Medicine

## 2019-02-17 ENCOUNTER — Other Ambulatory Visit (INDEPENDENT_AMBULATORY_CARE_PROVIDER_SITE_OTHER): Payer: Medicare Other

## 2019-02-17 VITALS — BP 122/80 | HR 78 | Temp 98.5°F | Ht 60.5 in | Wt 184.0 lb

## 2019-02-17 DIAGNOSIS — M1712 Unilateral primary osteoarthritis, left knee: Secondary | ICD-10-CM | POA: Diagnosis not present

## 2019-02-17 DIAGNOSIS — Z23 Encounter for immunization: Secondary | ICD-10-CM | POA: Diagnosis not present

## 2019-02-17 DIAGNOSIS — Z85038 Personal history of other malignant neoplasm of large intestine: Secondary | ICD-10-CM

## 2019-02-17 DIAGNOSIS — I10 Essential (primary) hypertension: Secondary | ICD-10-CM

## 2019-02-17 LAB — COMPREHENSIVE METABOLIC PANEL
ALT: 10 U/L (ref 0–35)
AST: 16 U/L (ref 0–37)
Albumin: 4.1 g/dL (ref 3.5–5.2)
Alkaline Phosphatase: 91 U/L (ref 39–117)
BUN: 18 mg/dL (ref 6–23)
CO2: 25 mEq/L (ref 19–32)
Calcium: 9.5 mg/dL (ref 8.4–10.5)
Chloride: 102 mEq/L (ref 96–112)
Creatinine, Ser: 1.31 mg/dL — ABNORMAL HIGH (ref 0.40–1.20)
GFR: 39.56 mL/min — ABNORMAL LOW (ref >60.00)
Glucose, Bld: 75 mg/dL (ref 70–99)
Potassium: 4.1 mEq/L (ref 3.5–5.1)
Sodium: 136 mEq/L (ref 135–145)
Total Bilirubin: 0.3 mg/dL (ref 0.2–1.2)
Total Protein: 7.5 g/dL (ref 6.0–8.3)

## 2019-02-17 LAB — CBC WITH DIFFERENTIAL/PLATELET
Basophils Absolute: 0.1 10*3/uL (ref 0.0–0.1)
Basophils Relative: 1.2 % (ref 0.0–3.0)
Eosinophils Absolute: 0.5 10*3/uL (ref 0.0–0.7)
Eosinophils Relative: 6.8 % — ABNORMAL HIGH (ref 0.0–5.0)
HCT: 32.9 % — ABNORMAL LOW (ref 36.0–46.0)
Hemoglobin: 11.1 g/dL — ABNORMAL LOW (ref 12.0–15.0)
Lymphocytes Relative: 34.8 % (ref 12.0–46.0)
Lymphs Abs: 2.3 10*3/uL (ref 0.7–4.0)
MCHC: 33.7 g/dL (ref 30.0–36.0)
MCV: 89 fl (ref 78.0–100.0)
Monocytes Absolute: 0.5 10*3/uL (ref 0.1–1.0)
Monocytes Relative: 7.6 % (ref 3.0–12.0)
Neutro Abs: 3.3 10*3/uL (ref 1.4–7.7)
Neutrophils Relative %: 49.6 % (ref 43.0–77.0)
Platelets: 308 10*3/uL (ref 150.0–400.0)
RBC: 3.7 Mil/uL — ABNORMAL LOW (ref 3.87–5.11)
RDW: 13.9 % (ref 11.5–15.5)
WBC: 6.7 10*3/uL (ref 4.0–10.5)

## 2019-02-17 MED ORDER — HYDROCHLOROTHIAZIDE 25 MG PO TABS: 25.0000 mg | ORAL_TABLET | Freq: Every day | ORAL | 3 refills | Status: DC

## 2019-02-17 MED ORDER — OLMESARTAN MEDOXOMIL 20 MG PO TABS: 20.0000 mg | ORAL_TABLET | Freq: Every day | ORAL | 3 refills | Status: DC

## 2019-02-17 NOTE — Assessment & Plan Note (Signed)
Using Voltaren and taking Tylenol Pain adequately controlled Continue

## 2019-02-17 NOTE — Patient Instructions (Addendum)
  Tests ordered today. Your results will be released to [place] (or called to you) after review.  If any changes need to be made, you will be notified at that same time.  All other Health Maintenance issues reviewed.   All recommended immunizations and age-appropriate screenings are up-to-date or discussed.  Flu immunization administered today.    Medications reviewed and updated.  Changes include :  none   Your prescription(s) have been submitted to your pharmacy. Please take as directed and contact our office if you believe you are having problem(s) with the medication(s).   Please followup in about one year

## 2019-02-17 NOTE — Assessment & Plan Note (Signed)
Blood pressure well controlled Continue current medications at current doses CMP 

## 2019-02-17 NOTE — Assessment & Plan Note (Signed)
She is overdue for colonoscopy She will call GI herself-declined referral Stressed the importance of calling them

## 2019-02-18 ENCOUNTER — Encounter: Payer: Self-pay | Admitting: Internal Medicine

## 2019-02-18 DIAGNOSIS — N183 Chronic kidney disease, stage 3 unspecified: Secondary | ICD-10-CM | POA: Insufficient documentation

## 2019-02-20 ENCOUNTER — Other Ambulatory Visit: Payer: Self-pay

## 2019-02-20 MED ORDER — AMLODIPINE BESYLATE 5 MG PO TABS: 5.0000 mg | ORAL_TABLET | Freq: Every day | ORAL | 3 refills | Status: DC

## 2019-02-21 ENCOUNTER — Encounter: Payer: Self-pay | Admitting: Gastroenterology

## 2019-03-08 ENCOUNTER — Other Ambulatory Visit (INDEPENDENT_AMBULATORY_CARE_PROVIDER_SITE_OTHER): Payer: Medicare Other

## 2019-03-08 DIAGNOSIS — I10 Essential (primary) hypertension: Secondary | ICD-10-CM

## 2019-03-08 LAB — BASIC METABOLIC PANEL
BUN: 12 mg/dL (ref 6–23)
CO2: 24 mEq/L (ref 19–32)
Calcium: 9.5 mg/dL (ref 8.4–10.5)
Chloride: 103 mEq/L (ref 96–112)
Creatinine, Ser: 0.96 mg/dL (ref 0.40–1.20)
GFR: 56.62 mL/min — ABNORMAL LOW (ref >60.00)
Glucose, Bld: 81 mg/dL (ref 70–99)
Potassium: 3.3 mEq/L — ABNORMAL LOW (ref 3.5–5.1)
Sodium: 138 mEq/L (ref 135–145)

## 2019-03-17 ENCOUNTER — Encounter: Payer: Self-pay | Admitting: Gastroenterology

## 2019-03-17 ENCOUNTER — Other Ambulatory Visit: Payer: Self-pay

## 2019-03-17 ENCOUNTER — Ambulatory Visit (AMBULATORY_SURGERY_CENTER): Payer: Self-pay | Admitting: *Deleted

## 2019-03-17 VITALS — Temp 97.8°F | Ht 60.5 in | Wt 188.0 lb

## 2019-03-17 DIAGNOSIS — Z86010 Personal history of colonic polyps: Secondary | ICD-10-CM

## 2019-03-17 DIAGNOSIS — Z85038 Personal history of other malignant neoplasm of large intestine: Secondary | ICD-10-CM

## 2019-03-17 MED ORDER — PEG 3350-KCL-NA BICARB-NACL 420 G PO SOLR: 4000.0000 mL | Freq: Once | ORAL | 0 refills | Status: AC

## 2019-03-17 NOTE — Progress Notes (Signed)

## 2019-03-28 ENCOUNTER — Telehealth: Payer: Self-pay

## 2019-03-28 NOTE — Telephone Encounter (Signed)
Pt answered "NO" to all Covid Questions °

## 2019-03-28 NOTE — Telephone Encounter (Signed)
Covid-19 screening questions   Do you now or have you had a fever in the last 14 days?  Do you have any respiratory symptoms of shortness of breath or cough now or in the last 14 days?  Do you have any family members or close contacts with diagnosed or suspected Covid-19 in the past 14 days?  Have you been tested for Covid-19 and found to be positive?       

## 2019-03-29 ENCOUNTER — Ambulatory Visit (AMBULATORY_SURGERY_CENTER): Payer: Medicare Other | Admitting: Gastroenterology

## 2019-03-29 ENCOUNTER — Other Ambulatory Visit: Payer: Self-pay

## 2019-03-29 ENCOUNTER — Encounter: Payer: Self-pay | Admitting: Gastroenterology

## 2019-03-29 VITALS — BP 111/66 | HR 77 | Temp 97.3°F | Resp 24 | Ht 60.5 in | Wt 188.0 lb

## 2019-03-29 DIAGNOSIS — Z85038 Personal history of other malignant neoplasm of large intestine: Secondary | ICD-10-CM | POA: Diagnosis not present

## 2019-03-29 DIAGNOSIS — D124 Benign neoplasm of descending colon: Secondary | ICD-10-CM | POA: Diagnosis not present

## 2019-03-29 DIAGNOSIS — D123 Benign neoplasm of transverse colon: Secondary | ICD-10-CM

## 2019-03-29 DIAGNOSIS — D128 Benign neoplasm of rectum: Secondary | ICD-10-CM | POA: Diagnosis not present

## 2019-03-29 DIAGNOSIS — K635 Polyp of colon: Secondary | ICD-10-CM | POA: Diagnosis not present

## 2019-03-29 MED ORDER — SODIUM CHLORIDE 0.9 % IV SOLN: 500.0000 mL | Freq: Once | INTRAVENOUS | Status: DC

## 2019-03-29 NOTE — Progress Notes (Signed)
PT taken to PACU. Monitors in place. VSS. Report given to RN. 

## 2019-03-29 NOTE — Patient Instructions (Signed)
Please read handouts provided. Continue present medications. Await pathology results.        YOU HAD AN ENDOSCOPIC PROCEDURE TODAY AT THE [facility]:   Refer to the procedure report that was given to you for any specific questions about what was found during the examination.  If the procedure report does not answer your questions, please call your gastroenterologist to clarify.  If you requested that your care partner not be given the details of your procedure findings, then the procedure report has been included in a sealed envelope for you to review at your convenience later.  YOU SHOULD EXPECT: Some feelings of bloating in the abdomen. Passage of more gas than usual.  Walking can help get rid of the air that was put into your GI tract during the procedure and reduce the bloating. If you had a lower endoscopy (such as a colonoscopy or flexible sigmoidoscopy) you may notice spotting of blood in your stool or on the toilet paper. If you underwent a bowel prep for your procedure, you may not have a normal bowel movement for a few days.  Please Note:  You might notice some irritation and congestion in your nose or some drainage.  This is from the oxygen used during your procedure.  There is no need for concern and it should clear up in a day or so.  SYMPTOMS TO REPORT IMMEDIATELY:   Following lower endoscopy (colonoscopy or flexible sigmoidoscopy):  Excessive amounts of blood in the stool  Significant tenderness or worsening of abdominal pains  Swelling of the abdomen that is new, acute  Fever of 100F or higher    For urgent or emergent issues, a gastroenterologist can be reached at any hour by calling [phone].   DIET:  We do recommend a small meal at first, but then you may proceed to your regular diet.  Drink plenty of fluids but you should avoid alcoholic beverages for 24 hours.  ACTIVITY:  You should plan to take it easy for the rest of today and you should NOT  DRIVE or use heavy machinery until tomorrow (because of the sedation medicines used during the test).    FOLLOW UP: Our staff will call the number listed on your records 48-72 hours following your procedure to check on you and address any questions or concerns that you may have regarding the information given to you following your procedure. If we do not reach you, we will leave a message.  We will attempt to reach you two times.  During this call, we will ask if you have developed any symptoms of COVID 19. If you develop any symptoms (ie: fever, flu-like symptoms, shortness of breath, cough etc.) before then, please call [phone].  If you test positive for Covid 19 in the 2 weeks post procedure, please call and report this information to us.    If any biopsies were taken you will be contacted by phone or by letter within the next 1-3 weeks.  Please call us at [phone] if you have not heard about the biopsies in 3 weeks.    SIGNATURES/CONFIDENTIALITY: You and/or your care partner have signed paperwork which will be entered into your electronic medical record.  These signatures attest to the fact that that the information above on your After Visit Summary has been reviewed and is understood.  Full responsibility of the confidentiality of this discharge information lies with you and/or your care-partner. 

## 2019-03-29 NOTE — Op Note (Signed)
[facility] Patient Name: Shirah Lieto Procedure Date: 03/29/2019 8:40 AM MRN: [id number] Endoscopist: Mauri Pole , MD Age: 73 Referring MD:  Date of Birth: 1946/05/11 Gender: Female Account #: [id number] Procedure:                Colonoscopy Indications:              High risk colon cancer surveillance: Personal                            history of colonic polyps, High risk colon cancer                            surveillance: Personal history of colon cancer Medicines:                Monitored Anesthesia Care Procedure:                Pre-Anesthesia Assessment:                           - Prior to the procedure, a History and Physical                            was performed, and patient medications and                            allergies were reviewed. The patient's tolerance of                            previous anesthesia was also reviewed. The risks                            and benefits of the procedure and the sedation                            options and risks were discussed with the patient.                            All questions were answered, and informed consent                            was obtained. Prior Anticoagulants: The patient has                            taken no previous anticoagulant or antiplatelet                            agents. ASA Grade Assessment: II - A patient with                            mild systemic disease. After reviewing the risks                            and benefits, the patient was deemed in  satisfactory condition to undergo the procedure.                           After obtaining informed consent, the colonoscope                            was passed under direct vision. Throughout the                            procedure, the patient's blood pressure, pulse, and                            oxygen saturations were monitored continuously. The   Colonoscope was introduced through the anus and                            advanced to the the ileocolonic anastomosis. The                            colonoscopy was performed without difficulty. The                            patient tolerated the procedure well. The quality                            of the bowel preparation was good. Ileocolonic                            anastomosis the terminal ileum and the rectum were                            photographed. Scope In: 8:45:28 AM Scope Out: 9:04:07 AM Scope Withdrawal Time: 0 hours 12 minutes 17 seconds  Total Procedure Duration: 0 hours 18 minutes 39 seconds  Findings:                 The perianal and digital rectal examinations were                            normal.                           Four sessile polyps were found in the descending                            colon and transverse colon. The polyps were 4 to 6                            mm in size. These polyps were removed with a cold                            snare. Resection and retrieval were complete.                           A 3 mm polyp was found in  the rectum. The polyp was                            sessile. The polyp was removed with a cold biopsy                            forceps. Resection and retrieval were complete.                           There was evidence of a prior end-to-side                            ileo-colonic anastomosis in the ascending colon.                            This was patent and was characterized by healthy                            appearing mucosa.                           Non-bleeding internal hemorrhoids were found during                            retroflexion. The hemorrhoids were medium-sized.                           A few small-mouthed diverticula were found in the                            sigmoid colon and descending colon. Complications:            No immediate complications. Estimated Blood Loss:     Estimated  blood loss was minimal. Impression:               - Four 4 to 6 mm polyps in the descending colon and                            in the transverse colon, removed with a cold snare.                            Resected and retrieved.                           - One 3 mm polyp in the rectum, removed with a cold                            biopsy forceps. Resected and retrieved.                           - Patent end-to-side ileo-colonic anastomosis,                            characterized by healthy appearing mucosa.                           -  Non-bleeding internal hemorrhoids.                           - Diverticulosis in the sigmoid colon and in the                            descending colon. Recommendation:           - Patient has a contact number available for                            emergencies. The signs and symptoms of potential                            delayed complications were discussed with the                            patient. Return to normal activities tomorrow.                            Written discharge instructions were provided to the                            patient.                           - Resume previous diet.                           - Continue present medications.                           - Await pathology results.                           - Repeat colonoscopy in 3 years for surveillance                            based on pathology results. Mauri Pole, MD 03/29/2019 9:11:04 AM This report has been signed electronically.

## 2019-03-29 NOTE — Progress Notes (Signed)
Called to room to assist during endoscopic procedure.  Patient ID and intended procedure confirmed with present staff. Received instructions for my participation in the procedure from the performing physician.  

## 2019-03-29 NOTE — Progress Notes (Signed)
Pt's states no medical or surgical changes since previsit or office visit.  Temp taken by KA VS taken by CW

## 2019-03-31 ENCOUNTER — Telehealth: Payer: Self-pay

## 2019-03-31 ENCOUNTER — Telehealth: Payer: Self-pay | Admitting: *Deleted

## 2019-03-31 NOTE — Telephone Encounter (Signed)
No answer

## 2019-03-31 NOTE — Telephone Encounter (Signed)
First attempt, tried calling two separate times and phone rings once and then all you hear is static.  Will try to call back today after lunch.

## 2019-04-04 ENCOUNTER — Encounter: Payer: Self-pay | Admitting: Gastroenterology

## 2019-05-21 ENCOUNTER — Other Ambulatory Visit: Payer: Self-pay | Admitting: Internal Medicine

## 2019-05-22 ENCOUNTER — Other Ambulatory Visit: Payer: Self-pay | Admitting: Internal Medicine

## 2019-05-22 NOTE — Telephone Encounter (Signed)
Medication Refill - Medication: amLODipine (NORVASC) 5 MG tablet [id number]     Preferred Pharmacy (with phone number or street name):  [id number] [address]  [address] [place] [address]  Phone: [phone] Fax: [phone]    90 day supply   Agent: Please be advised that RX refills may take up to 3 business days. We ask that you follow-up with your pharmacy.

## 2019-08-30 ENCOUNTER — Telehealth: Payer: Self-pay

## 2019-08-30 NOTE — Telephone Encounter (Signed)
New message    The patient calls needs to discuss the COVID vaccine on her risk  & health history.    please advise

## 2019-08-30 NOTE — Telephone Encounter (Signed)
Yes - ok to get and should get vaccine

## 2019-08-30 NOTE — Telephone Encounter (Signed)
Pt aware.

## 2019-08-30 NOTE — Telephone Encounter (Signed)
Ok to get vaccine based on hx and meds

## 2019-09-13 ENCOUNTER — Other Ambulatory Visit: Payer: Self-pay | Admitting: Internal Medicine

## 2020-02-11 ENCOUNTER — Other Ambulatory Visit: Payer: Self-pay | Admitting: Internal Medicine

## 2020-03-31 ENCOUNTER — Other Ambulatory Visit: Payer: Self-pay | Admitting: Internal Medicine

## 2020-03-31 DIAGNOSIS — I10 Essential (primary) hypertension: Secondary | ICD-10-CM

## 2020-05-10 ENCOUNTER — Other Ambulatory Visit: Payer: Self-pay | Admitting: Internal Medicine

## 2020-05-11 NOTE — Progress Notes (Signed)
Subjective:    Patient ID: Darlene Park, female    DOB: 24-May-1946, 74 y.o.   MRN: [id number]   This visit occurred during the SARS-CoV-2 public health emergency.  Safety protocols were in place, including screening questions prior to the visit, additional usage of staff PPE, and extensive cleaning of exam room while observing appropriate contact time as indicated for disinfecting solutions.    HPI She is here for a physical exam.   She feels her ankles are swelling a little.  She had this for a while, but it is intermittent.    She has some increased joint pain.     Medications and allergies reviewed with patient and updated if appropriate.  Patient Active Problem List   Diagnosis Date Noted  . Chronic kidney disease (CKD), stage III (moderate) ([place]) 02/18/2019  . Neck pain 01/19/2018  . Osteoarthritis of left knee   . Obese 09/25/2010  . History of colon cancer, cecum 03/28/2009  . Vitamin D deficiency 11/29/2008  . Essential hypertension 11/14/2008    Current Outpatient Medications on File Prior to Visit  Medication Sig Dispense Refill  . Acetaminophen (TYLENOL ARTHRITIS PAIN PO) Take 1 tablet by mouth.    Marland Kitchen amLODipine (NORVASC) 5 MG tablet TAKE 1 TABLET BY MOUTH EVERY DAY 90 tablet 0  . BENICAR 20 MG tablet TAKE 1 TABLET BY MOUTH EVERY DAY 60 tablet 0  . Cholecalciferol 1000 UNIT capsule Take 1,000 Units by mouth daily.      . diclofenac sodium (VOLTAREN) 1 % GEL Apply 2 g topically 3 (three) times daily as needed. 100 g 1   No current facility-administered medications on file prior to visit.    Past Medical History:  Diagnosis Date  . Anemia   . CHICKENPOX, HX OF 11/14/2008   Qualifier: Diagnosis of  By: Marca Ancona RMA, Lucy    . GERD    PT. DENIES.03/17/19  . HYPERTENSION   . Malignant neoplasm of cecum ([place]) 03/28/2009   resection; No chemo/adj tx per onc  . Osteoarthritis of left knee   . URINARY INCONTINENCE   . VITAMIN D DEFICIENCY     Past  Surgical History:  Procedure Laterality Date  . ABDOMINAL HYSTERECTOMY  1976   Partial precancer cells done @ [facility] in [place]  12/2008  . COLONOSCOPY    . HEMORRHOID SURGERY  1980's  . POLYPECTOMY      Social History   Socioeconomic History  . Marital status: Divorced    Spouse name: Not on file  . Number of children: Not on file  . Years of education: Not on file  . Highest education level: Not on file  Occupational History  . Not on file  Tobacco Use  . Smoking status: Never Smoker  . Smokeless tobacco: Never Used  Vaping Use  . Vaping Use: Never used  Substance and Sexual Activity  . Alcohol use: Yes    Alcohol/week: 0.0 standard drinks    Comment: socially  . Drug use: No  . Sexual activity: Not on file  Other Topics Concern  . Not on file  Social History Narrative   Retired from [employer] with daughter at her home   Social Determinants of Health   Financial Resource Strain:   . Difficulty of Paying Living Expenses: Not on file  Food Insecurity:   . Worried About Charity fundraiser in the Last Year: Not on file  .  Ran Out of Food in the Last Year: Not on file  Transportation Needs:   . Lack of Transportation (Medical): Not on file  . Lack of Transportation (Non-Medical): Not on file  Physical Activity:   . Days of Exercise per Week: Not on file  . Minutes of Exercise per Session: Not on file  Stress:   . Feeling of Stress : Not on file  Social Connections:   . Frequency of Communication with Friends and Family: Not on file  . Frequency of Social Gatherings with Friends and Family: Not on file  . Attends Religious Services: Not on file  . Active Member of Clubs or Organizations: Not on file  . Attends Archivist Meetings: Not on file  . Marital Status: Not on file    Family History  Adopted: Yes  Problem Relation Age of Onset  . Hypertension Other   . Heart disease Other   . Prostate  cancer Other   . Colon cancer Neg Hx   . Esophageal cancer Neg Hx   . Rectal cancer Neg Hx   . Stomach cancer Neg Hx   . Colon polyps Neg Hx     Review of Systems  Constitutional: Negative for chills and fever.  Eyes: Negative for visual disturbance.  Respiratory: Negative for cough, shortness of breath and wheezing.   Cardiovascular: Positive for leg swelling (mild). Negative for chest pain and palpitations.  Gastrointestinal: Negative for abdominal pain, blood in stool, constipation, diarrhea and nausea.       No gerd  Genitourinary: Negative for dysuria and hematuria.  Musculoskeletal: Positive for arthralgias. Negative for back pain.  Skin: Negative for color change and rash.  Neurological: Negative for dizziness, light-headedness and headaches.  Psychiatric/Behavioral: Positive for sleep disturbance (sometimes). Negative for dysphoric mood. The patient is not nervous/anxious.        Objective:   Vitals:   05/13/20 0850  BP: 126/70  Pulse: 99  Temp: 98.1 F (36.7 C)  SpO2: 99%   Filed Weights   05/13/20 0850  Weight: 186 lb (84.4 kg)   Body mass index is 35.73 kg/m.  BP Readings from Last 3 Encounters:  05/13/20 126/70  03/29/19 111/66  02/17/19 122/80    Wt Readings from Last 3 Encounters:  05/13/20 186 lb (84.4 kg)  03/29/19 188 lb (85.3 kg)  03/17/19 188 lb (85.3 kg)    Depression screen [facility] 2/9 05/13/2020 01/19/2018 01/18/2017 01/18/2017 11/05/2014  Decreased Interest 0 1 0 0 1  Down, Depressed, Hopeless 0 0 1 1 0  PHQ - 2 Score 0 1 1 1 1   Altered sleeping 1 - 0 - -  Tired, decreased energy 1 - 0 - -  Change in appetite 0 - 0 - -  Feeling bad or failure about yourself  0 - 0 - -  Trouble concentrating 0 - 0 - -  Moving slowly or fidgety/restless 0 - 0 - -  Suicidal thoughts 0 - 0 - -  PHQ-9 Score 2 - 1 - -  Difficult doing work/chores Not difficult at all - - - -      Physical Exam Constitutional: She appears well-developed and well-nourished.  No distress.  HENT:  Head: Normocephalic and atraumatic.  Right Ear: External ear normal. Normal ear canal and TM Left Ear: External ear normal.  Normal ear canal and TM Mouth/Throat: Oropharynx is clear and moist.  Eyes: Conjunctivae and EOM are normal.  Neck: Neck supple. No tracheal deviation present. No thyromegaly  present.  No carotid bruit  Cardiovascular: Normal rate, regular rhythm and normal heart sounds.   No murmur heard.  No edema LLE, minimal edema RLE. Pulmonary/Chest: Effort normal and breath sounds normal. No respiratory distress. She has no wheezes. She has no rales.  Breast: deferred   Abdominal: Soft. She exhibits no distension. There is no tenderness.  Lymphadenopathy: She has no cervical adenopathy.  Skin: Skin is warm and dry. She is not diaphoretic.  Psychiatric: She has a normal mood and affect. Her behavior is normal.        Assessment & Plan:   Physical exam: Screening blood work    ordered Immunizations  Flu vaccine today, discussed covid booster, shingrix  Colonoscopy  Up to date  Mammogram  Due - will schedule Gyn  n/a Dexa   Will schedule Eye exams  Due - will schedule Exercise  None - encouraged regular exercise Weight  Encouraged weight loss Substance abuse  none   Screened for depression using the PHQ 2 scale.  No evidence of depression.     See Problem List for Assessment and Plan of chronic medical problems.

## 2020-05-11 NOTE — Patient Instructions (Addendum)
Blood work was ordered.    Flu immunization administered today.    Medications reviewed and updated.  Changes include :   none  Your prescription(s) have been submitted to your pharmacy. Please take as directed and contact our office if you believe you are having problem(s) with the medication(s).    Please followup in 6 months      Health Maintenance, Female Adopting a healthy lifestyle and getting preventive care are important in promoting health and wellness. Ask your health care provider about:  The right schedule for you to have regular tests and exams.  Things you can do on your own to prevent diseases and keep yourself healthy. What should I know about diet, weight, and exercise? Eat a healthy diet   Eat a diet that includes plenty of vegetables, fruits, low-fat dairy products, and lean protein.  Do not eat a lot of foods that are high in solid fats, added sugars, or sodium. Maintain a healthy weight Body mass index (BMI) is used to identify weight problems. It estimates body fat based on height and weight. Your health care provider can help determine your BMI and help you achieve or maintain a healthy weight. Get regular exercise Get regular exercise. This is one of the most important things you can do for your health. Most adults should:  Exercise for at least 150 minutes each week. The exercise should increase your heart rate and make you sweat (moderate-intensity exercise).  Do strengthening exercises at least twice a week. This is in addition to the moderate-intensity exercise.  Spend less time sitting. Even light physical activity can be beneficial. Watch cholesterol and blood lipids Have your blood tested for lipids and cholesterol at 76 years of age, then have this test every 5 years. Have your cholesterol levels checked more often if:  Your lipid or cholesterol levels are high.  You are older than 76 years of age.  You are at high risk for heart  disease. What should I know about cancer screening? Depending on your health history and family history, you may need to have cancer screening at various ages. This may include screening for:  Breast cancer.  Cervical cancer.  Colorectal cancer.  Skin cancer.  Lung cancer. What should I know about heart disease, diabetes, and high blood pressure? Blood pressure and heart disease  High blood pressure causes heart disease and increases the risk of stroke. This is more likely to develop in people who have high blood pressure readings, are of African descent, or are overweight.  Have your blood pressure checked: ? Every 3-5 years if you are 72-74 years of age. ? Every year if you are 2 years old or older. Diabetes Have regular diabetes screenings. This checks your fasting blood sugar level. Have the screening done:  Once every three years after age 29 if you are at a normal weight and have a low risk for diabetes.  More often and at a younger age if you are overweight or have a high risk for diabetes. What should I know about preventing infection? Hepatitis B If you have a higher risk for hepatitis B, you should be screened for this virus. Talk with your health care provider to find out if you are at risk for hepatitis B infection. Hepatitis C Testing is recommended for:  Everyone born from [birth year] through 1967.  Anyone with known risk factors for hepatitis C. Sexually transmitted infections (STIs)  Get screened for STIs, including gonorrhea and chlamydia, if: ?  You are sexually active and are younger than 76 years of age. ? You are older than 76 years of age and your health care provider tells you that you are at risk for this type of infection. ? Your sexual activity has changed since you were last screened, and you are at increased risk for chlamydia or gonorrhea. Ask your health care provider if you are at risk.  Ask your health care provider about whether you are at high  risk for HIV. Your health care provider may recommend a prescription medicine to help prevent HIV infection. If you choose to take medicine to prevent HIV, you should first get tested for HIV. You should then be tested every 3 months for as long as you are taking the medicine. Pregnancy  If you are about to stop having your period (premenopausal) and you may become pregnant, seek counseling before you get pregnant.  Take 400 to 800 micrograms (mcg) of folic acid every day if you become pregnant.  Ask for birth control (contraception) if you want to prevent pregnancy. Osteoporosis and menopause Osteoporosis is a disease in which the bones lose minerals and strength with aging. This can result in bone fractures. If you are 41 years old or older, or if you are at risk for osteoporosis and fractures, ask your health care provider if you should:  Be screened for bone loss.  Take a calcium or vitamin D supplement to lower your risk of fractures.  Be given hormone replacement therapy (HRT) to treat symptoms of menopause. Follow these instructions at home: Lifestyle  Do not use any products that contain nicotine or tobacco, such as cigarettes, e-cigarettes, and chewing tobacco. If you need help quitting, ask your health care provider.  Do not use street drugs.  Do not share needles.  Ask your health care provider for help if you need support or information about quitting drugs. Alcohol use  Do not drink alcohol if: ? Your health care provider tells you not to drink. ? You are pregnant, may be pregnant, or are planning to become pregnant.  If you drink alcohol: ? Limit how much you use to 0-1 drink a day. ? Limit intake if you are breastfeeding.  Be aware of how much alcohol is in your drink. In the [place] one drink equals one 12 oz bottle of beer (355 mL), one 5 oz glass of wine (148 mL), or one 1 oz glass of hard liquor (44 mL). General instructions  Schedule regular health, dental,  and eye exams.  Stay current with your vaccines.  Tell your health care provider if: ? You often feel depressed. ? You have ever been abused or do not feel safe at home. Summary  Adopting a healthy lifestyle and getting preventive care are important in promoting health and wellness.  Follow your health care provider's instructions about healthy diet, exercising, and getting tested or screened for diseases.  Follow your health care provider's instructions on monitoring your cholesterol and blood pressure. This information is not intended to replace advice given to you by your health care provider. Make sure you discuss any questions you have with your health care provider. Document Revised: 06/01/2018 Document Reviewed: 06/01/2018 Elsevier Patient Education  2020 [employer].

## 2020-05-13 ENCOUNTER — Other Ambulatory Visit: Payer: Self-pay

## 2020-05-13 ENCOUNTER — Ambulatory Visit (INDEPENDENT_AMBULATORY_CARE_PROVIDER_SITE_OTHER): Payer: Medicare Other | Admitting: Internal Medicine

## 2020-05-13 ENCOUNTER — Encounter: Payer: Self-pay | Admitting: Internal Medicine

## 2020-05-13 VITALS — BP 126/70 | HR 99 | Temp 98.1°F | Ht 60.5 in | Wt 186.0 lb

## 2020-05-13 DIAGNOSIS — N1831 Chronic kidney disease, stage 3a: Secondary | ICD-10-CM | POA: Diagnosis not present

## 2020-05-13 DIAGNOSIS — E559 Vitamin D deficiency, unspecified: Secondary | ICD-10-CM | POA: Diagnosis not present

## 2020-05-13 DIAGNOSIS — Z0000 Encounter for general adult medical examination without abnormal findings: Secondary | ICD-10-CM | POA: Diagnosis not present

## 2020-05-13 DIAGNOSIS — Z6835 Body mass index (BMI) 35.0-35.9, adult: Secondary | ICD-10-CM

## 2020-05-13 DIAGNOSIS — I10 Essential (primary) hypertension: Secondary | ICD-10-CM

## 2020-05-13 DIAGNOSIS — R600 Localized edema: Secondary | ICD-10-CM

## 2020-05-13 DIAGNOSIS — Z23 Encounter for immunization: Secondary | ICD-10-CM | POA: Diagnosis not present

## 2020-05-13 LAB — COMPREHENSIVE METABOLIC PANEL
ALT: 9 U/L (ref 0–35)
AST: 14 U/L (ref 0–37)
Albumin: 4.3 g/dL (ref 3.5–5.2)
Alkaline Phosphatase: 92 U/L (ref 39–117)
BUN: 9 mg/dL (ref 6–23)
CO2: 26 mEq/L (ref 19–32)
Calcium: 9.5 mg/dL (ref 8.4–10.5)
Chloride: 102 mEq/L (ref 96–112)
Creatinine, Ser: 0.95 mg/dL (ref 0.40–1.20)
GFR: 58.24 mL/min — ABNORMAL LOW (ref >60.00)
Glucose, Bld: 82 mg/dL (ref 70–99)
Potassium: 3.5 mEq/L (ref 3.5–5.1)
Sodium: 138 mEq/L (ref 135–145)
Total Bilirubin: 0.4 mg/dL (ref 0.2–1.2)
Total Protein: 7.6 g/dL (ref 6.0–8.3)

## 2020-05-13 LAB — CBC WITH DIFFERENTIAL/PLATELET
Basophils Absolute: 0 10*3/uL (ref 0.0–0.1)
Basophils Relative: 0.6 % (ref 0.0–3.0)
Eosinophils Absolute: 0.3 10*3/uL (ref 0.0–0.7)
Eosinophils Relative: 4.4 % (ref 0.0–5.0)
HCT: 35.8 % — ABNORMAL LOW (ref 36.0–46.0)
Hemoglobin: 12.1 g/dL (ref 12.0–15.0)
Lymphocytes Relative: 28.7 % (ref 12.0–46.0)
Lymphs Abs: 2 10*3/uL (ref 0.7–4.0)
MCHC: 33.7 g/dL (ref 30.0–36.0)
MCV: 88.1 fl (ref 78.0–100.0)
Monocytes Absolute: 0.5 10*3/uL (ref 0.1–1.0)
Monocytes Relative: 7.6 % (ref 3.0–12.0)
Neutro Abs: 4 10*3/uL (ref 1.4–7.7)
Neutrophils Relative %: 58.7 % (ref 43.0–77.0)
Platelets: 321 10*3/uL (ref 150.0–400.0)
RBC: 4.07 Mil/uL (ref 3.87–5.11)
RDW: 14 % (ref 11.5–15.5)
WBC: 6.8 10*3/uL (ref 4.0–10.5)

## 2020-05-13 LAB — LIPID PANEL
Cholesterol: 186 mg/dL (ref 0–200)
HDL: 47.1 mg/dL (ref >39.00)
LDL Cholesterol: 118 mg/dL — ABNORMAL HIGH (ref 0–99)
NonHDL: 139.16
Total CHOL/HDL Ratio: 4
Triglycerides: 108 mg/dL (ref 0.0–149.0)
VLDL: 21.6 mg/dL (ref 0.0–40.0)

## 2020-05-13 LAB — TSH: TSH: 0.56 u[IU]/mL (ref 0.35–4.50)

## 2020-05-13 LAB — VITAMIN D 25 HYDROXY (VIT D DEFICIENCY, FRACTURES): VITD: 53.2 ng/mL (ref 30.00–100.00)

## 2020-05-13 MED ORDER — OLMESARTAN MEDOXOMIL 20 MG PO TABS: 20.0000 mg | ORAL_TABLET | Freq: Every day | ORAL | 1 refills | Status: DC

## 2020-05-13 MED ORDER — AMLODIPINE BESYLATE 5 MG PO TABS
5.0000 mg | ORAL_TABLET | Freq: Every day | ORAL | 1 refills | Status: DC
Start: 2020-05-13 — End: 2021-02-11

## 2020-05-13 NOTE — Addendum Note (Signed)
Addended by: Trenda Moots on: 87/27/6184 09:31 AM   Modules accepted: Orders

## 2020-05-13 NOTE — Assessment & Plan Note (Signed)
Chronic Cmp, cbc, vitamin d level

## 2020-05-13 NOTE — Assessment & Plan Note (Signed)
New problem Minimal edema RLE, no edema LLE Likely venous insuff Discussed causes  Advised low salt diet, inc exercise, weight loss Can try compression socks if swelling worsens Elevate legs during day

## 2020-05-13 NOTE — Assessment & Plan Note (Signed)
Discussed importance of weight loss ?Stressed regular exercise - goal 30 minutes 5 days a week ?Discussed decreasing portions, decreasing sugars/carbs ?Increase veges, lean protin ? ? ?

## 2020-05-13 NOTE — Assessment & Plan Note (Signed)
Chronic BP well controlled Continue amlodipine 5 mg daily, benicar 20 mg daily cmp

## 2020-05-13 NOTE — Assessment & Plan Note (Signed)
Chronic Taking vitamin D daily Check vitamin D level  

## 2020-05-14 ENCOUNTER — Other Ambulatory Visit (INDEPENDENT_AMBULATORY_CARE_PROVIDER_SITE_OTHER): Payer: Medicare Other

## 2020-05-14 DIAGNOSIS — Z23 Encounter for immunization: Secondary | ICD-10-CM

## 2020-05-28 ENCOUNTER — Ambulatory Visit: Payer: Medicare Other

## 2020-06-12 ENCOUNTER — Ambulatory Visit (INDEPENDENT_AMBULATORY_CARE_PROVIDER_SITE_OTHER): Payer: Medicare Other

## 2020-06-12 ENCOUNTER — Other Ambulatory Visit: Payer: Self-pay

## 2020-06-12 VITALS — BP 120/70 | HR 88 | Temp 98.4°F | Ht 61.0 in | Wt 179.6 lb

## 2020-06-12 DIAGNOSIS — Z0000 Encounter for general adult medical examination without abnormal findings: Secondary | ICD-10-CM

## 2020-06-12 NOTE — Patient Instructions (Signed)
[name] , Thank you for taking time to come for your Medicare Wellness Visit. I appreciate your ongoing commitment to your health goals. Please review the following plan we discussed and let me know if I can assist you in the future.   Screening recommendations/referrals: Colonoscopy: 03/29/2019; due every 5 years Mammogram: 03/14/2010 Bone Density: never done Recommended yearly ophthalmology/optometry visit for glaucoma screening and checkup Recommended yearly dental visit for hygiene and checkup  Vaccinations: Influenza vaccine: 05/13/2020 Pneumococcal vaccine: up to date Tdap vaccine: 01/10/2014; due every 10 years Shingles vaccine: never done   Covid-19: up to date  Advanced directives: Advance directive discussed with you today. Even though you declined this today please call our office should you change your mind and we can give you the proper paperwork for you to fill out.  Conditions/risks identified: Yes; Reviewed health maintenance screenings with patient today and relevant education, vaccines, and/or referrals were provided. Please continue to do your personal lifestyle choices by: daily care of teeth and gums, regular physical activity (goal should be 5 days a week for 30 minutes), eat a healthy diet, avoid tobacco and drug use, limiting any alcohol intake, taking a low-dose aspirin (if not allergic or have been advised by your provider otherwise) and taking vitamins and minerals as recommended by your provider. Continue doing brain stimulating activities (puzzles, reading, adult coloring books, staying active) to keep memory sharp. Continue to eat heart healthy diet (full of fruits, vegetables, whole grains, lean protein, water--limit salt, fat, and sugar intake) and increase physical activity as tolerated.  Next appointment: Please schedule your next Medicare Wellness Visit with your Nurse Health Advisor in 1 year by calling [phone].  Preventive Care 76 Years and Older,  Female Preventive care refers to lifestyle choices and visits with your health care provider that can promote health and wellness. What does preventive care include?  A yearly physical exam. This is also called an annual well check.  Dental exams once or twice a year.  Routine eye exams. Ask your health care provider how often you should have your eyes checked.  Personal lifestyle choices, including:  Daily care of your teeth and gums.  Regular physical activity.  Eating a healthy diet.  Avoiding tobacco and drug use.  Limiting alcohol use.  Practicing safe sex.  Taking low-dose aspirin every day.  Taking vitamin and mineral supplements as recommended by your health care provider. What happens during an annual well check? The services and screenings done by your health care provider during your annual well check will depend on your age, overall health, lifestyle risk factors, and family history of disease. Counseling  Your health care provider may ask you questions about your:  Alcohol use.  Tobacco use.  Drug use.  Emotional well-being.  Home and relationship well-being.  Sexual activity.  Eating habits.  History of falls.  Memory and ability to understand (cognition).  Work and work Statistician.  Reproductive health. Screening  You may have the following tests or measurements:  Height, weight, and BMI.  Blood pressure.  Lipid and cholesterol levels. These may be checked every 5 years, or more frequently if you are over 76 years old.  Skin check.  Lung cancer screening. You may have this screening every year starting at age 76 if you have a 30-pack-year history of smoking and currently smoke or have quit within the past 15 years.  Fecal occult blood test (FOBT) of the stool. You may have this test every year starting at  age 76.  Flexible sigmoidoscopy or colonoscopy. You may have a sigmoidoscopy every 5 years or a colonoscopy every 10 years  starting at age 76.  Hepatitis C blood test.  Hepatitis B blood test.  Sexually transmitted disease (STD) testing.  Diabetes screening. This is done by checking your blood sugar (glucose) after you have not eaten for a while (fasting). You may have this done every 1-3 years.  Bone density scan. This is done to screen for osteoporosis. You may have this done starting at age 76.  Mammogram. This may be done every 1-2 years. Talk to your health care provider about how often you should have regular mammograms. Talk with your health care provider about your test results, treatment options, and if necessary, the need for more tests. Vaccines  Your health care provider may recommend certain vaccines, such as:  Influenza vaccine. This is recommended every year.  Tetanus, diphtheria, and acellular pertussis (Tdap, Td) vaccine. You may need a Td booster every 10 years.  Zoster vaccine. You may need this after age 52.  Pneumococcal 13-valent conjugate (PCV13) vaccine. One dose is recommended after age 30.  Pneumococcal polysaccharide (PPSV23) vaccine. One dose is recommended after age 59. Talk to your health care provider about which screenings and vaccines you need and how often you need them. This information is not intended to replace advice given to you by your health care provider. Make sure you discuss any questions you have with your health care provider. Document Released: 07/05/2015 Document Revised: 02/26/2016 Document Reviewed: 04/09/2015 Elsevier Interactive Patient Education  2017 [place] Prevention in the Home Falls can cause injuries. They can happen to people of all ages. There are many things you can do to make your home safe and to help prevent falls. What can I do on the outside of my home?  Regularly fix the edges of walkways and driveways and fix any cracks.  Remove anything that might make you trip as you walk through a door, such as a raised step or  threshold.  Trim any bushes or trees on the path to your home.  Use bright outdoor lighting.  Clear any walking paths of anything that might make someone trip, such as rocks or tools.  Regularly check to see if handrails are loose or broken. Make sure that both sides of any steps have handrails.  Any raised decks and porches should have guardrails on the edges.  Have any leaves, snow, or ice cleared regularly.  Use sand or salt on walking paths during winter.  Clean up any spills in your garage right away. This includes oil or grease spills. What can I do in the bathroom?  Use night lights.  Install grab bars by the toilet and in the tub and shower. Do not use towel bars as grab bars.  Use non-skid mats or decals in the tub or shower.  If you need to sit down in the shower, use a plastic, non-slip stool.  Keep the floor dry. Clean up any water that spills on the floor as soon as it happens.  Remove soap buildup in the tub or shower regularly.  Attach bath mats securely with double-sided non-slip rug tape.  Do not have throw rugs and other things on the floor that can make you trip. What can I do in the bedroom?  Use night lights.  Make sure that you have a light by your bed that is easy to reach.  Do not use any  sheets or blankets that are too big for your bed. They should not hang down onto the floor.  Have a firm chair that has side arms. You can use this for support while you get dressed.  Do not have throw rugs and other things on the floor that can make you trip. What can I do in the kitchen?  Clean up any spills right away.  Avoid walking on wet floors.  Keep items that you use a lot in easy-to-reach places.  If you need to reach something above you, use a strong step stool that has a grab bar.  Keep electrical cords out of the way.  Do not use floor polish or wax that makes floors slippery. If you must use wax, use non-skid floor wax.  Do not have  throw rugs and other things on the floor that can make you trip. What can I do with my stairs?  Do not leave any items on the stairs.  Make sure that there are handrails on both sides of the stairs and use them. Fix handrails that are broken or loose. Make sure that handrails are as long as the stairways.  Check any carpeting to make sure that it is firmly attached to the stairs. Fix any carpet that is loose or worn.  Avoid having throw rugs at the top or bottom of the stairs. If you do have throw rugs, attach them to the floor with carpet tape.  Make sure that you have a light switch at the top of the stairs and the bottom of the stairs. If you do not have them, ask someone to add them for you. What else can I do to help prevent falls?  Wear shoes that:  Do not have high heels.  Have rubber bottoms.  Are comfortable and fit you well.  Are closed at the toe. Do not wear sandals.  If you use a stepladder:  Make sure that it is fully opened. Do not climb a closed stepladder.  Make sure that both sides of the stepladder are locked into place.  Ask someone to hold it for you, if possible.  Clearly mark and make sure that you can see:  Any grab bars or handrails.  First and last steps.  Where the edge of each step is.  Use tools that help you move around (mobility aids) if they are needed. These include:  Canes.  Walkers.  Scooters.  Crutches.  Turn on the lights when you go into a dark area. Replace any light bulbs as soon as they burn out.  Set up your furniture so you have a clear path. Avoid moving your furniture around.  If any of your floors are uneven, fix them.  If there are any pets around you, be aware of where they are.  Review your medicines with your doctor. Some medicines can make you feel dizzy. This can increase your chance of falling. Ask your doctor what other things that you can do to help prevent falls. This information is not intended to  replace advice given to you by your health care provider. Make sure you discuss any questions you have with your health care provider. Document Released: 04/04/2009 Document Revised: 11/14/2015 Document Reviewed: 07/13/2014 Elsevier Interactive Patient Education  2017 [employer].

## 2020-06-12 NOTE — Progress Notes (Signed)
Subjective:   Darlene Park is a 74 y.o. female who presents for Medicare Annual (Subsequent) preventive examination.  Review of Systems    No ROS. Medicare Wellness Visit. Additional risk factors are reflected in social history. Cardiac Risk Factors include: advanced age (>12men, >53 women);family history of premature cardiovascular disease;hypertension;obesity (BMI >30kg/m2)     Objective:    Today's Vitals   06/12/20 0936  BP: 120/70  Pulse: 88  Temp: 98.4 F (36.9 C)  SpO2: 97%  Weight: 179 lb 9.6 oz (81.5 kg)  Height: 5\' 1"  (1.549 m)  PainSc: 0-No pain   Body mass index is 33.94 kg/m.  Advanced Directives 06/12/2020  Does Patient Have a Medical Advance Directive? No  Would patient like information on creating a medical advance directive? No - Patient declined    Current Medications (verified) Outpatient Encounter Medications as of 06/12/2020  Medication Sig  . Acetaminophen (TYLENOL ARTHRITIS PAIN PO) Take 1 tablet by mouth.  Marland Kitchen amLODipine (NORVASC) 5 MG tablet Take 1 tablet (5 mg total) by mouth daily.  . Cholecalciferol 1000 UNIT capsule Take 1,000 Units by mouth daily.  . diclofenac sodium (VOLTAREN) 1 % GEL Apply 2 g topically 3 (three) times daily as needed.  Marland Kitchen olmesartan (BENICAR) 20 MG tablet Take 1 tablet (20 mg total) by mouth daily.   No facility-administered encounter medications on file as of 06/12/2020.    Allergies (verified) Penicillins   History: Past Medical History:  Diagnosis Date  . Anemia   . CHICKENPOX, HX OF 11/14/2008   Qualifier: Diagnosis of  By: Marca Ancona RMA, Lucy    . GERD    PT. DENIES.03/17/19  . HYPERTENSION   . Malignant neoplasm of cecum ([place]) 03/28/2009   resection; No chemo/adj tx per onc  . Osteoarthritis of left knee   . URINARY INCONTINENCE   . VITAMIN D DEFICIENCY    Past Surgical History:  Procedure Laterality Date  . ABDOMINAL HYSTERECTOMY  1976   Partial precancer cells done @ [place] in  [place]  12/2008  . COLONOSCOPY    . HEMORRHOID SURGERY  1980's  . POLYPECTOMY     Family History  Adopted: Yes  Problem Relation Age of Onset  . Hypertension Other   . Heart disease Other   . Prostate cancer Other   . Colon cancer Neg Hx   . Esophageal cancer Neg Hx   . Rectal cancer Neg Hx   . Stomach cancer Neg Hx   . Colon polyps Neg Hx    Social History   Socioeconomic History  . Marital status: Divorced    Spouse name: Not on file  . Number of children: Not on file  . Years of education: Not on file  . Highest education level: Not on file  Occupational History  . Not on file  Tobacco Use  . Smoking status: Never Smoker  . Smokeless tobacco: Never Used  Vaping Use  . Vaping Use: Never used  Substance and Sexual Activity  . Alcohol use: Yes    Alcohol/week: 0.0 standard drinks    Comment: socially  . Drug use: No  . Sexual activity: Not on file  Other Topics Concern  . Not on file  Social History Narrative   Retired from [employer] with daughter at her home   Social Determinants of Health   Financial Resource Strain: Low Risk   . Difficulty of Paying Living Expenses: Not  hard at all  Food Insecurity: No Food Insecurity  . Worried About Charity fundraiser in the Last Year: Never true  . Ran Out of Food in the Last Year: Never true  Transportation Needs: No Transportation Needs  . Lack of Transportation (Medical): No  . Lack of Transportation (Non-Medical): No  Physical Activity: Inactive  . Days of Exercise per Week: 0 days  . Minutes of Exercise per Session: 0 min  Stress: No Stress Concern Present  . Feeling of Stress : Not at all  Social Connections: Moderately Integrated  . Frequency of Communication with Friends and Family: More than three times a week  . Frequency of Social Gatherings with Friends and Family: More than three times a week  . Attends Religious Services: More than 4 times per year  .  Active Member of Clubs or Organizations: No  . Attends Archivist Meetings: More than 4 times per year  . 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 no  Interpreter Needed?: No  Information entered by :: Lisette Abu, LPN   Activities of Daily Living In your present state of health, do you have any difficulty performing the following activities: 06/12/2020 05/13/2020  Hearing? N N  Vision? N N  Difficulty concentrating or making decisions? N N  Walking or climbing stairs? N N  Dressing or bathing? N N  Doing errands, shopping? N N  Preparing Food and eating ? N -  Using the Toilet? N -  In the past six months, have you accidently leaked urine? N -  Do you have problems with loss of bowel control? N -  Managing your Medications? N -  Managing your Finances? N -  Housekeeping or managing your Housekeeping? N -  Some recent data might be hidden    Patient Care Team: Binnie Rail, MD as PCP - General (Internal Medicine) Inda Castle, MD (Inactive) as Consulting Physician (Gastroenterology)  Indicate any recent Medical Services you may have received from other than [facility] providers in the past year (date may be approximate).     Assessment:   This is a routine wellness examination for Darlene Park.  Hearing/Vision screen No exam data present  Dietary issues and exercise activities discussed: Current Exercise Habits: The patient does not participate in  regular exercise at present, Exercise limited by: None identified  Goals    .  Client understands the importance of follow-up with providers by attending scheduled visits (pt-stated)      To maintain my current health status by continuing to eat healthy, stay physically active and socially active.      Depression Screen PHQ 2/9 Scores 06/12/2020 05/13/2020 01/19/2018 01/18/2017 01/18/2017 11/05/2014 06/13/2013  PHQ - 2 Score 0 0 1 1 1 1  0  PHQ- 9 Score - 2 - 1 - - -    Fall Risk Fall Risk  06/12/2020 05/13/2020 01/19/2018 01/18/2017 01/13/2016  Falls in the past year? 0 0 No  No No  Number falls in past yr: 0 0 - - -  Injury with Fall? 0 0 - - -  Risk for fall due to : No Fall Risks No Fall Risks - - -  Follow up Falls evaluation completed Falls evaluation completed - - -    FALL RISK PREVENTION PERTAINING TO THE HOME:  Any stairs in or around the home? No  If so, are there any without handrails? No  Home free of loose throw rugs in walkways, pet beds, electrical cords, etc? Yes  Adequate lighting in your home to reduce risk of falls? Yes   ASSISTIVE DEVICES UTILIZED TO PREVENT FALLS:  Life alert? No  Use of a cane, walker or w/c? No  Grab bars in the bathroom? No  Shower chair or bench in shower? No  Elevated toilet seat or a handicapped toilet? No   TIMED UP AND GO:  Was the test performed? No .  Length of time to ambulate 10 feet: 0 sec.   Gait steady and fast without use of assistive device  Cognitive Function: Normal cognitive status assessed by direct observation by this Nurse Health Advisor. No abnormalities found.         Immunizations Immunization History  Administered Date(s) Administered  . Fluad Quad(high Dose 65+) 02/17/2019, 05/13/2020  . Influenza Split 03/26/2011, 04/14/2012  . Influenza Whole 03/10/2010  . Influenza, High Dose Seasonal PF 05/01/2016, 05/06/2017, 06/13/2018  . Influenza,inj,Quad PF,6+ Mos 06/13/2013  . PFIZER SARS-COV-2 Vaccination  11/25/2019, 12/16/2019  . Pneumococcal Conjugate-13 11/05/2014  . Pneumococcal Polysaccharide-23 01/10/2014  . Td 06/22/2001  . Tdap 01/10/2014    TDAP status: Up to date  Flu Vaccine status: Up to date  Pneumococcal vaccine status: Up to date  Covid-19 vaccine status: Completed vaccines  Qualifies for Shingles Vaccine? Yes   Zostavax completed No   Shingrix Completed?: No.    Education has been provided regarding the importance of this vaccine. Patient has been advised to call insurance company to determine out of pocket expense if they have not yet received this vaccine. Advised may also receive vaccine at local pharmacy or Health Dept. Verbalized acceptance and understanding.  Screening Tests Health Maintenance  Topic Date Due  . DEXA SCAN  Never done  . COVID-19 Vaccine (3 - Pfizer risk 4-dose series) 01/13/2020  . TETANUS/TDAP  01/11/2024  . COLONOSCOPY  03/28/2024  . INFLUENZA VACCINE  Completed  . Hepatitis C Screening  Completed  . PNA vac Low Risk Adult  Completed    Health Maintenance  Health Maintenance Due  Topic Date Due  . DEXA SCAN  Never done  . COVID-19 Vaccine (3 - Pfizer risk 4-dose series) 01/13/2020    Colorectal cancer screening: Type of screening: Colonoscopy. Completed 03/29/2019. Repeat every 3-5 years  Mammogram status: Ordered for 06/2020. Pt provided with contact info and advised to call to schedule appt.   Bone Density status: Ordered for 06/2020. Pt provided with contact info and advised to call to schedule appt.  Lung Cancer Screening: (Low Dose CT Chest recommended if Age 52-80 years, 30 pack-year currently smoking OR have quit w/in 15years.) does not qualify.   Lung Cancer Screening Referral: no  Additional Screening:  Hepatitis C Screening: does qualify; Completed yes  Vision Screening: Recommended annual ophthalmology exams for early detection of glaucoma and other disorders of the eye. Is the patient up to date with their annual  eye exam?  No  Who is the provider or what is the  name of the office in which the patient attends annual eye exams? [facility] on [address] If pt is not established with a provider, would they like to be referred to a provider to establish care? No .   Dental Screening: Recommended annual dental exams for proper oral hygiene  Community Resource Referral / Chronic Care Management: CRR required this visit?  No   CCM required this visit?  No      Plan:     I have personally reviewed and noted the following in the patient's chart:   . Medical and social history . Use of alcohol, tobacco or illicit drugs  . Current medications and supplements . Functional ability and status . Nutritional status . Physical activity . Advanced directives . List of other physicians . Hospitalizations, surgeries, and ER visits in previous 12 months . Vitals . Screenings to include cognitive, depression, and falls . Referrals and appointments  In addition, I have reviewed and discussed with patient certain preventive protocols, quality metrics, and best practice recommendations. A written personalized care plan for preventive services as well as general preventive health recommendations were provided to patient.     Sheral Flow, LPN   [id number]   Nurse Notes:  Patient refused referral for eye exam.

## 2021-02-09 ENCOUNTER — Other Ambulatory Visit: Payer: Self-pay | Admitting: Internal Medicine

## 2021-03-06 ENCOUNTER — Other Ambulatory Visit: Payer: Self-pay | Admitting: Internal Medicine

## 2021-03-27 ENCOUNTER — Other Ambulatory Visit: Payer: Self-pay | Admitting: Internal Medicine

## 2021-03-27 DIAGNOSIS — I10 Essential (primary) hypertension: Secondary | ICD-10-CM

## 2021-04-02 ENCOUNTER — Other Ambulatory Visit: Payer: Self-pay | Admitting: Internal Medicine

## 2021-05-02 ENCOUNTER — Other Ambulatory Visit: Payer: Self-pay | Admitting: Internal Medicine

## 2021-06-01 ENCOUNTER — Other Ambulatory Visit: Payer: Self-pay | Admitting: Emergency Medicine

## 2021-06-24 ENCOUNTER — Other Ambulatory Visit: Payer: Self-pay | Admitting: Emergency Medicine

## 2021-06-24 NOTE — Telephone Encounter (Signed)
Your patient 

## 2021-07-10 ENCOUNTER — Encounter: Payer: Self-pay | Admitting: Internal Medicine

## 2021-07-10 NOTE — Patient Instructions (Addendum)
Blood work was ordered.     Medications changes include :   None  Your prescription(s) have been submitted to your pharmacy. Please take as directed and contact our office if you believe you are having problem(s) with the medication(s).   Please followup in 1 year    Health Maintenance, Female Adopting a healthy lifestyle and getting preventive care are important in promoting health and wellness. Ask your health care provider about: The right schedule for you to have regular tests and exams. Things you can do on your own to prevent diseases and keep yourself healthy. What should I know about diet, weight, and exercise? Eat a healthy diet  Eat a diet that includes plenty of vegetables, fruits, low-fat dairy products, and lean protein. Do not eat a lot of foods that are high in solid fats, added sugars, or sodium. Maintain a healthy weight Body mass index (BMI) is used to identify weight problems. It estimates body fat based on height and weight. Your health care provider can help determine your BMI and help you achieve or maintain a healthy weight. Get regular exercise Get regular exercise. This is one of the most important things you can do for your health. Most adults should: Exercise for at least 150 minutes each week. The exercise should increase your heart rate and make you sweat (moderate-intensity exercise). Do strengthening exercises at least twice a week. This is in addition to the moderate-intensity exercise. Spend less time sitting. Even light physical activity can be beneficial. Watch cholesterol and blood lipids Have your blood tested for lipids and cholesterol at 78 years of age, then have this test every 5 years. Have your cholesterol levels checked more often if: Your lipid or cholesterol levels are high. You are older than 78 years of age. You are at high risk for heart disease. What should I know about cancer screening? Depending on your health history and family  history, you may need to have cancer screening at various ages. This may include screening for: Breast cancer. Cervical cancer. Colorectal cancer. Skin cancer. Lung cancer. What should I know about heart disease, diabetes, and high blood pressure? Blood pressure and heart disease High blood pressure causes heart disease and increases the risk of stroke. This is more likely to develop in people who have high blood pressure readings or are overweight. Have your blood pressure checked: Every 3-5 years if you are 55-76 years of age. Every year if you are 61 years old or older. Diabetes Have regular diabetes screenings. This checks your fasting blood sugar level. Have the screening done: Once every three years after age 90 if you are at a normal weight and have a low risk for diabetes. More often and at a younger age if you are overweight or have a high risk for diabetes. What should I know about preventing infection? Hepatitis B If you have a higher risk for hepatitis B, you should be screened for this virus. Talk with your health care provider to find out if you are at risk for hepatitis B infection. Hepatitis C Testing is recommended for: Everyone born from [birth year] through 1967. Anyone with known risk factors for hepatitis C. Sexually transmitted infections (STIs) Get screened for STIs, including gonorrhea and chlamydia, if: You are sexually active and are younger than 78 years of age. You are older than 78 years of age and your health care provider tells you that you are at risk for this type of infection. Your sexual activity has changed  since you were last screened, and you are at increased risk for chlamydia or gonorrhea. Ask your health care provider if you are at risk. Ask your health care provider about whether you are at high risk for HIV. Your health care provider may recommend a prescription medicine to help prevent HIV infection. If you choose to take medicine to prevent HIV, you  should first get tested for HIV. You should then be tested every 3 months for as long as you are taking the medicine. Pregnancy If you are about to stop having your period (premenopausal) and you may become pregnant, seek counseling before you get pregnant. Take 400 to 800 micrograms (mcg) of folic acid every day if you become pregnant. Ask for birth control (contraception) if you want to prevent pregnancy. Osteoporosis and menopause Osteoporosis is a disease in which the bones lose minerals and strength with aging. This can result in bone fractures. If you are 71 years old or older, or if you are at risk for osteoporosis and fractures, ask your health care provider if you should: Be screened for bone loss. Take a calcium or vitamin D supplement to lower your risk of fractures. Be given hormone replacement therapy (HRT) to treat symptoms of menopause. Follow these instructions at home: Alcohol use Do not drink alcohol if: Your health care provider tells you not to drink. You are pregnant, may be pregnant, or are planning to become pregnant. If you drink alcohol: Limit how much you have to: 0-1 drink a day. Know how much alcohol is in your drink. In the [place] one drink equals one 12 oz bottle of beer (355 mL), one 5 oz glass of wine (148 mL), or one 1 oz glass of hard liquor (44 mL). Lifestyle Do not use any products that contain nicotine or tobacco. These products include cigarettes, chewing tobacco, and vaping devices, such as e-cigarettes. If you need help quitting, ask your health care provider. Do not use street drugs. Do not share needles. Ask your health care provider for help if you need support or information about quitting drugs. General instructions Schedule regular health, dental, and eye exams. Stay current with your vaccines. Tell your health care provider if: You often feel depressed. You have ever been abused or do not feel safe at home. Summary Adopting a healthy  lifestyle and getting preventive care are important in promoting health and wellness. Follow your health care provider's instructions about healthy diet, exercising, and getting tested or screened for diseases. Follow your health care provider's instructions on monitoring your cholesterol and blood pressure. This information is not intended to replace advice given to you by your health care provider. Make sure you discuss any questions you have with your health care provider. Document Revised: 10/28/2020 Document Reviewed: 10/28/2020 Elsevier Patient Education  [employer].

## 2021-07-10 NOTE — Progress Notes (Signed)
Subjective:    Patient ID: Darlene Park, female    DOB: 1946-06-16, 76 y.o.   MRN: [id number]   This visit occurred during the SARS-CoV-2 public health emergency.  Safety protocols were in place, including screening questions prior to the visit, additional usage of staff PPE, and extensive cleaning of exam room while observing appropriate contact time as indicated for disinfecting solutions.    HPI She is here for a physical exam.   Overall she is doing well.  Holiday season was busy and stressful.  Medications and allergies reviewed with patient and updated if appropriate.  Patient Active Problem List   Diagnosis Date Noted   Bilateral leg edema 05/13/2020   Chronic kidney disease (CKD), stage III (moderate) ([place]) 02/18/2019   Neck pain 01/19/2018   Osteoarthritis of left knee    Obese 09/25/2010   History of colon cancer, cecum 03/28/2009   Vitamin D deficiency 11/29/2008   Essential hypertension 11/14/2008    Current Outpatient Medications on File Prior to Visit  Medication Sig Dispense Refill   Acetaminophen (TYLENOL ARTHRITIS PAIN PO) Take 1 tablet by mouth.     Cholecalciferol 1000 UNIT capsule Take 1,000 Units by mouth daily.     diclofenac sodium (VOLTAREN) 1 % GEL Apply 2 g topically 3 (three) times daily as needed. 100 g 1   No current facility-administered medications on file prior to visit.    Past Medical History:  Diagnosis Date   Anemia    CHICKENPOX, HX OF 11/14/2008   Qualifier: Diagnosis of  By: Marca Ancona RMA, Lucy     GERD    PT. DENIES.03/17/19   HYPERTENSION    Malignant neoplasm of cecum ([place]) 03/28/2009   resection; No chemo/adj tx per onc   Osteoarthritis of left knee    URINARY INCONTINENCE    VITAMIN D DEFICIENCY     Past Surgical History:  Procedure Laterality Date   ABDOMINAL HYSTERECTOMY  1976   Partial precancer cells done @ [facility] in [place]  12/2008   COLONOSCOPY     HEMORRHOID SURGERY  1980's    POLYPECTOMY      Social History   Socioeconomic History   Marital status: Divorced    Spouse name: Not on file   Number of children: Not on file   Years of education: Not on file   Highest education level: Not on file  Occupational History   Not on file  Tobacco Use   Smoking status: Never   Smokeless tobacco: Never  Vaping Use   Vaping Use: Never used  Substance and Sexual Activity   Alcohol use: Yes    Alcohol/week: 0.0 standard drinks    Comment: socially   Drug use: No   Sexual activity: Not on file  Other Topics Concern   Not on file  Social History Narrative   Retired from [employer] with daughter at her home   Social Determinants of Health   Financial Resource Strain: Not on file  Food Insecurity: Not on file  Transportation Needs: Not on file  Physical Activity: Not on file  Stress: Not on file  Social Connections: Not on file    Family History  Adopted: Yes  Problem Relation Age of Onset   Hypertension Other    Heart disease Other    Prostate cancer Other    Colon cancer Neg Hx    Esophageal cancer Neg Hx    Rectal  cancer Neg Hx    Stomach cancer Neg Hx    Colon polyps Neg Hx     Review of Systems  Constitutional:  Negative for chills and fever.  Eyes:  Negative for visual disturbance.  Respiratory:  Negative for cough, shortness of breath and wheezing.   Cardiovascular:  Positive for leg swelling (chronic). Negative for chest pain and palpitations.  Gastrointestinal:  Negative for abdominal pain, blood in stool, constipation, diarrhea and nausea.       No gerd  Genitourinary:  Negative for dysuria and hematuria.  Musculoskeletal:  Positive for arthralgias (shoulders, knees). Negative for back pain.  Skin:  Negative for rash.  Neurological:  Negative for light-headedness and headaches.  Psychiatric/Behavioral:  Negative for dysphoric mood. The patient is not nervous/anxious.       Objective:   Vitals:   07/11/21  0839  BP: 124/70  Pulse: 80  Temp: 98.5 F (36.9 C)  SpO2: 97%   Filed Weights   07/11/21 0839  Weight: 176 lb 3.2 oz (79.9 kg)   Body mass index is 33.29 kg/m.  BP Readings from Last 3 Encounters:  07/11/21 124/70  06/12/20 120/70  05/13/20 126/70    Wt Readings from Last 3 Encounters:  07/11/21 176 lb 3.2 oz (79.9 kg)  06/12/20 179 lb 9.6 oz (81.5 kg)  05/13/20 186 lb (84.4 kg)    Depression screen [facility] 2/9 07/11/2021 06/12/2020 05/13/2020 01/19/2018 01/18/2017  Decreased Interest 0 0 0 1 0  Down, Depressed, Hopeless 1 0 0 0 1  PHQ - 2 Score 1 0 0 1 1  Altered sleeping 1 - 1 - 0  Tired, decreased energy 1 - 1 - 0  Change in appetite 0 - 0 - 0  Feeling bad or failure about yourself  0 - 0 - 0  Trouble concentrating 1 - 0 - 0  Moving slowly or fidgety/restless 0 - 0 - 0  Suicidal thoughts 0 - 0 - 0  PHQ-9 Score 4 - 2 - 1  Difficult doing work/chores Not difficult at all - Not difficult at all - -     No flowsheet data found.     Physical Exam Constitutional: She appears well-developed and well-nourished. No distress.  HENT:  Head: Normocephalic and atraumatic.  Right Ear: External ear normal. Normal ear canal and TM Left Ear: External ear normal.  Normal ear canal and TM Mouth/Throat: Oropharynx is clear and moist.  Eyes: Conjunctivae and EOM are normal.  Neck: Neck supple. No tracheal deviation present. No thyromegaly present.  No carotid bruit  Cardiovascular: Normal rate, regular rhythm and normal heart sounds.   No murmur heard.  No edema. Pulmonary/Chest: Effort normal and breath sounds normal. No respiratory distress. She has no wheezes. She has no rales.  Breast: deferred   Abdominal: Soft. She exhibits no distension. There is no tenderness.  Lymphadenopathy: She has no cervical adenopathy.  Skin: Skin is warm and dry. She is not diaphoretic.  Psychiatric: She has a normal mood and affect. Her behavior is normal.     Lab Results  Component Value  Date   WBC 6.0 07/11/2021   HGB 12.7 07/11/2021   HCT 38.2 07/11/2021   PLT 308.0 07/11/2021   GLUCOSE 78 07/11/2021   CHOL 187 07/11/2021   TRIG 103.0 07/11/2021   HDL 48.20 07/11/2021   LDLCALC 118 (H) 07/11/2021   ALT 7 07/11/2021   AST 14 07/11/2021   NA 138 07/11/2021   K 4.5 07/11/2021  CL 103 07/11/2021   CREATININE 1.03 07/11/2021   BUN 10 07/11/2021   CO2 27 07/11/2021   TSH 0.65 07/11/2021         Assessment & Plan:   Physical exam: Screening blood work  ordered Exercise  none - encouraged regular exercise Weight  she has lost weight - working on it -encouraged weight loss Substance abuse  none   Screened for depression using the PHQ 9 scale, revealed a score of 4.  She did admit to difficulty falling asleep and being tired because of that.  She feels most of that is related to watching TV too late at night and not going to bed as early as she should.  She also states feeling down at times, but feels that that is normal-felt it more over the holidays and with deaths in the family or with friends.  She is also without a car for a few months and that is causing some feelings of being down.  Hopefully that will be resolved over the next few months.  She also states having trouble concentrating at times.  She adamantly denies depression.  She does not feel like she needs medication or to speak to anyone.  She knows she should try to go to bed earlier so that her sleep is better.  She is currently not exercising regularly-encouraged her to start walking, which may help improve her feeling down at times and improve her energy level.  Time spent screening for depression and discussing positive scores and things that she can do to improve some of her symptoms was 5 minutes.    Reviewed recommended immunizations.  Recommended shingles vaccine.  DEXA scan ordered-she will have this done with her mammogram.   Health Maintenance  Topic Date Due   DEXA SCAN  Never done    Zoster Vaccines- Shingrix (1 of 2) 10/09/2021 (Originally 12/14/1993)   COLONOSCOPY (Pts 45-10yrs Insurance coverage will need to be confirmed)  03/28/2022   TETANUS/TDAP  01/11/2024   Pneumonia Vaccine 57+ Years old  Completed   INFLUENZA VACCINE  Completed   COVID-19 Vaccine  Completed   Hepatitis C Screening  Completed   HPV VACCINES  Aged Out          See Problem List for Assessment and Plan of chronic medical problems.

## 2021-07-11 ENCOUNTER — Other Ambulatory Visit: Payer: Self-pay

## 2021-07-11 ENCOUNTER — Ambulatory Visit (INDEPENDENT_AMBULATORY_CARE_PROVIDER_SITE_OTHER): Payer: Medicare Other | Admitting: Internal Medicine

## 2021-07-11 VITALS — BP 124/70 | HR 80 | Temp 98.5°F | Ht 61.0 in | Wt 176.2 lb

## 2021-07-11 DIAGNOSIS — Z0000 Encounter for general adult medical examination without abnormal findings: Secondary | ICD-10-CM | POA: Diagnosis not present

## 2021-07-11 DIAGNOSIS — N1831 Chronic kidney disease, stage 3a: Secondary | ICD-10-CM | POA: Diagnosis not present

## 2021-07-11 DIAGNOSIS — Z85038 Personal history of other malignant neoplasm of large intestine: Secondary | ICD-10-CM | POA: Diagnosis not present

## 2021-07-11 DIAGNOSIS — R600 Localized edema: Secondary | ICD-10-CM

## 2021-07-11 DIAGNOSIS — Z13820 Encounter for screening for osteoporosis: Secondary | ICD-10-CM | POA: Diagnosis not present

## 2021-07-11 DIAGNOSIS — I10 Essential (primary) hypertension: Secondary | ICD-10-CM | POA: Diagnosis not present

## 2021-07-11 DIAGNOSIS — E559 Vitamin D deficiency, unspecified: Secondary | ICD-10-CM

## 2021-07-11 DIAGNOSIS — Z1331 Encounter for screening for depression: Secondary | ICD-10-CM

## 2021-07-11 LAB — CBC WITH DIFFERENTIAL/PLATELET
Basophils Absolute: 0 10*3/uL (ref 0.0–0.1)
Basophils Relative: 0.7 % (ref 0.0–3.0)
Eosinophils Absolute: 0.3 10*3/uL (ref 0.0–0.7)
Eosinophils Relative: 5.1 % — ABNORMAL HIGH (ref 0.0–5.0)
HCT: 38.2 % (ref 36.0–46.0)
Hemoglobin: 12.7 g/dL (ref 12.0–15.0)
Lymphocytes Relative: 30.8 % (ref 12.0–46.0)
Lymphs Abs: 1.9 10*3/uL (ref 0.7–4.0)
MCHC: 33.1 g/dL (ref 30.0–36.0)
MCV: 88.8 fl (ref 78.0–100.0)
Monocytes Absolute: 0.4 10*3/uL (ref 0.1–1.0)
Monocytes Relative: 7.2 % (ref 3.0–12.0)
Neutro Abs: 3.4 10*3/uL (ref 1.4–7.7)
Neutrophils Relative %: 56.2 % (ref 43.0–77.0)
Platelets: 308 10*3/uL (ref 150.0–400.0)
RBC: 4.3 Mil/uL (ref 3.87–5.11)
RDW: 13.7 % (ref 11.5–15.5)
WBC: 6 10*3/uL (ref 4.0–10.5)

## 2021-07-11 LAB — COMPREHENSIVE METABOLIC PANEL
ALT: 7 U/L (ref 0–35)
AST: 14 U/L (ref 0–37)
Albumin: 4.2 g/dL (ref 3.5–5.2)
Alkaline Phosphatase: 72 U/L (ref 39–117)
BUN: 10 mg/dL (ref 6–23)
CO2: 27 mEq/L (ref 19–32)
Calcium: 9.6 mg/dL (ref 8.4–10.5)
Chloride: 103 mEq/L (ref 96–112)
Creatinine, Ser: 1.03 mg/dL (ref 0.40–1.20)
GFR: 52.42 mL/min — ABNORMAL LOW (ref >60.00)
Glucose, Bld: 78 mg/dL (ref 70–99)
Potassium: 4.5 mEq/L (ref 3.5–5.1)
Sodium: 138 mEq/L (ref 135–145)
Total Bilirubin: 0.4 mg/dL (ref 0.2–1.2)
Total Protein: 7.4 g/dL (ref 6.0–8.3)

## 2021-07-11 LAB — LIPID PANEL
Cholesterol: 187 mg/dL (ref 0–200)
HDL: 48.2 mg/dL (ref >39.00)
LDL Cholesterol: 118 mg/dL — ABNORMAL HIGH (ref 0–99)
NonHDL: 139
Total CHOL/HDL Ratio: 4
Triglycerides: 103 mg/dL (ref 0.0–149.0)
VLDL: 20.6 mg/dL (ref 0.0–40.0)

## 2021-07-11 LAB — TSH: TSH: 0.65 u[IU]/mL (ref 0.35–5.50)

## 2021-07-11 LAB — VITAMIN D 25 HYDROXY (VIT D DEFICIENCY, FRACTURES): VITD: 65.15 ng/mL (ref 30.00–100.00)

## 2021-07-11 MED ORDER — AMLODIPINE BESYLATE 5 MG PO TABS: 5.0000 mg | ORAL_TABLET | Freq: Every day | ORAL | 3 refills | Status: DC

## 2021-07-11 MED ORDER — OLMESARTAN MEDOXOMIL 20 MG PO TABS: 20.0000 mg | ORAL_TABLET | Freq: Every day | ORAL | 3 refills | Status: DC

## 2021-07-11 MED ORDER — BENICAR 20 MG PO TABS: 20.0000 mg | ORAL_TABLET | Freq: Every day | ORAL | 3 refills | Status: DC

## 2021-07-11 NOTE — Assessment & Plan Note (Signed)
Chronic Mild Overall controlled Encouraged elevating legs when sitting Start regular exercise Low-sodium diet Compression socks

## 2021-07-11 NOTE — Assessment & Plan Note (Addendum)
History of colon resection Last colonoscopy 2020-up-to-date No evidence of recurrence Repeat colonoscopy at the end of this year

## 2021-07-11 NOTE — Assessment & Plan Note (Signed)
Chronic Blood pressure well controlled CMP Continue amlodipine 5 mg daily, Benicar 20 mg daily 

## 2021-07-11 NOTE — Assessment & Plan Note (Signed)
Chronic Taking vitamin D daily Check vitamin D level  

## 2021-07-11 NOTE — Assessment & Plan Note (Signed)
Chronic CMP Reminded to have good water intake throughout the day and avoid NSAIDs

## 2021-07-15 ENCOUNTER — Other Ambulatory Visit: Payer: Self-pay | Admitting: Internal Medicine

## 2021-07-15 DIAGNOSIS — E782 Mixed hyperlipidemia: Secondary | ICD-10-CM

## 2021-07-15 DIAGNOSIS — E785 Hyperlipidemia, unspecified: Secondary | ICD-10-CM | POA: Insufficient documentation

## 2021-07-15 MED ORDER — ROSUVASTATIN CALCIUM 5 MG PO TABS: 5.0000 mg | ORAL_TABLET | Freq: Every day | ORAL | 3 refills | Status: DC

## 2021-07-16 ENCOUNTER — Other Ambulatory Visit: Payer: Self-pay

## 2021-07-16 ENCOUNTER — Ambulatory Visit (INDEPENDENT_AMBULATORY_CARE_PROVIDER_SITE_OTHER): Payer: Medicare Other

## 2021-07-16 DIAGNOSIS — Z0000 Encounter for general adult medical examination without abnormal findings: Secondary | ICD-10-CM | POA: Diagnosis not present

## 2021-07-16 NOTE — Progress Notes (Signed)
I connected with [name] today by telephone and verified that I am speaking with the correct person using two identifiers. Location patient: home Location provider: work Persons participating in the virtual visit: patient, provider.   I discussed the limitations, risks, security and privacy concerns of performing an evaluation and management service by telephone and the availability of in person appointments. I also discussed with the patient that there may be a patient responsible charge related to this service. The patient expressed understanding and verbally consented to this telephonic visit.    Interactive audio and video telecommunications were attempted between this provider and patient, however failed, due to patient having technical difficulties OR patient did not have access to video capability.  We continued and completed visit with audio only.  Some vital signs may be absent or patient reported.   Time Spent with patient on telephone encounter: 40 minutes  Subjective:   Darlene Park is a 76 y.o. female who presents for Medicare Annual (Subsequent) preventive examination.  Review of Systems     Cardiac Risk Factors include: advanced age (>70men, >14 women);family history of premature cardiovascular disease;hypertension     Objective:    There were no vitals filed for this visit. There is no height or weight on file to calculate BMI.  Advanced Directives 07/16/2021 06/12/2020  Does Patient Have a Medical Advance Directive? No No  Would patient like information on creating a medical advance directive? Yes (MAU/Ambulatory/Procedural Areas - Information given) No - Patient declined    Current Medications (verified) Outpatient Encounter Medications as of 07/16/2021  Medication Sig   Acetaminophen (TYLENOL ARTHRITIS PAIN PO) Take 1 tablet by mouth.   amLODipine (NORVASC) 5 MG tablet Take 1 tablet (5 mg total) by mouth daily.   BENICAR 20 MG tablet Take 1  tablet (20 mg total) by mouth daily.   Cholecalciferol 1000 UNIT capsule Take 1,000 Units by mouth daily.   diclofenac sodium (VOLTAREN) 1 % GEL Apply 2 g topically 3 (three) times daily as needed.   rosuvastatin (CRESTOR) 5 MG tablet Take 1 tablet (5 mg total) by mouth daily.   No facility-administered encounter medications on file as of 07/16/2021.    Allergies (verified) Penicillins   History: Past Medical History:  Diagnosis Date   Anemia    CHICKENPOX, HX OF 11/14/2008   Qualifier: Diagnosis of  By: Marca Ancona RMA, Lucy     GERD    PT. DENIES.03/17/19   HYPERTENSION    Malignant neoplasm of cecum ([place]) 03/28/2009   resection; No chemo/adj tx per onc   Osteoarthritis of left knee    URINARY INCONTINENCE    VITAMIN D DEFICIENCY    Past Surgical History:  Procedure Laterality Date   ABDOMINAL HYSTERECTOMY  1976   Partial precancer cells done @ [facility] in [place]  12/2008   COLONOSCOPY     HEMORRHOID SURGERY  1980's   POLYPECTOMY     Family History  Adopted: Yes  Problem Relation Age of Onset   Hypertension Other    Heart disease Other    Prostate cancer Other    Colon cancer Neg Hx    Esophageal cancer Neg Hx    Rectal cancer Neg Hx    Stomach cancer Neg Hx    Colon polyps Neg Hx    Social History   Socioeconomic History   Marital status: Divorced    Spouse name: Not on file   Number of children: Not on  file   Years of education: Not on file   Highest education level: Not on file  Occupational History   Not on file  Tobacco Use   Smoking status: Never   Smokeless tobacco: Never  Vaping Use   Vaping Use: Never used  Substance and Sexual Activity   Alcohol use: Yes    Alcohol/week: 0.0 standard drinks    Comment: socially   Drug use: No   Sexual activity: Not on file  Other Topics Concern   Not on file  Social History Narrative   Retired from [employer] with daughter at her home   Social Determinants of  Health   Financial Resource Strain: Low Risk    Difficulty of Paying Living Expenses: Not hard at all  Food Insecurity: No Food Insecurity   Worried About Charity fundraiser in the Last Year: Never true   Arboriculturist in the Last Year: Never true  Transportation Needs: No Transportation Needs   Lack of Transportation (Medical): No   Lack of Transportation (Non-Medical): No  Physical Activity: Sufficiently Active   Days of Exercise per Week: 5 days   Minutes of Exercise per Session: 30 min  Stress: No Stress Concern Present   Feeling of Stress : Not at all  Social Connections: Moderately Integrated   Frequency of Communication with Friends and Family: More than three times a week   Frequency of Social Gatherings with Friends and Family: More than three times a week   Attends Religious Services: More than 4 times per year   Active Member of Clubs or Organizations: No   Attends Music therapist: More than 4 times per year   Marital Status: Widowed    Tobacco Counseling Counseling given: Not Answered   Clinical Intake:  Pre-visit preparation completed: Yes  Pain : No/denies pain     BMI - recorded: 33.29 Nutritional Status: BMI > 30  Obese Nutritional Risks: None Diabetes: No  How often do you need to have someone help you when you read instructions, pamphlets, or other written materials from your doctor or pharmacy?: 1 - Never What is the last grade level you completed in school?: Retired [employer]  Diabetic? no  Interpreter Needed?: No  Information entered by :: Lisette Abu, LPN   Activities of Daily Living In your present state of health, do you have any difficulty performing the following activities: 07/16/2021  Hearing? N  Vision? N  Difficulty concentrating or making decisions? N  Walking or climbing stairs? N  Dressing or bathing? N  Doing errands, shopping? N  Preparing Food and eating ? N  Using the Toilet? N  In the  past six months, have you accidently leaked urine? N  Do you have problems with loss of bowel control? N  Managing your Medications? N  Managing your Finances? N  Housekeeping or managing your Housekeeping? N  Some recent data might be hidden    Patient Care Team: Binnie Rail, MD as PCP - General (Internal Medicine) Inda Castle, MD (Inactive) as Consulting Physician (Gastroenterology)  Indicate any recent Medical Services you may have received from other than [facility] providers in the past year (date may be approximate).     Assessment:   This is a routine wellness examination for Collene.  Hearing/Vision screen Hearing Screening - Comments:: Patient denied any hearing difficulty.   No hearing aids.  Vision Screening - Comments:: Patient wears corrective glasses/contacts.  Eye exam done annually by: [facility] on [address]  Dietary issues and exercise activities discussed: Current Exercise Habits: Home exercise routine, Type of exercise: walking, Time (Minutes): 30, Frequency (Times/Week): 5, Weekly Exercise (Minutes/Week): 150, Intensity: Moderate, Exercise limited by: orthopedic condition(s)   Goals Addressed               This Visit's Progress     Patient Stated (pt-stated)        My goal is to continue to be independent, active and take care of my screenings for this year such as my mammogram, colonoscopy and bone density.      Depression Screen PHQ 2/9 Scores 07/16/2021 07/11/2021 06/12/2020 05/13/2020 01/19/2018 01/18/2017 01/18/2017  PHQ - 2 Score 1 1 0 0 1 1 1   PHQ- 9 Score 4 4 - 2 - 1 -    Fall Risk Fall Risk  07/16/2021 07/11/2021 06/12/2020 05/13/2020 01/19/2018  Falls in the past year? 0 0 0 0 No  Number falls in past yr: 0 0 0 0 -  Injury with Fall? 0 0 0 0 -  Risk for fall due to : No Fall Risks No Fall Risks No Fall Risks No Fall Risks -  Follow up Falls evaluation completed Falls evaluation completed Falls evaluation completed Falls evaluation completed -     FALL RISK PREVENTION PERTAINING TO THE HOME:  Any stairs in or around the home? No  If so, are there any without handrails? No  Home free of loose throw rugs in walkways, pet beds, electrical cords, etc? Yes  Adequate lighting in your home to reduce risk of falls? Yes   ASSISTIVE DEVICES UTILIZED TO PREVENT FALLS:  Life alert? No  Use of a cane, walker or w/c? No  Grab bars in the bathroom? No  Shower chair or bench in shower? Yes  Elevated toilet seat or a handicapped toilet? Yes   TIMED UP AND GO:  Was the test performed? No .  Length of time to ambulate 10 feet: n/a sec.   Apperance of gait: per patient, steady with no assistive devices  Cognitive Function: Normal cognitive status assessed by direct observation by this Nurse Health Advisor. No abnormalities found.          Immunizations Immunization History  Administered Date(s) Administered   Fluad Quad(high Dose 65+) 02/17/2019, 05/13/2020   Influenza Split 03/26/2011, 04/14/2012   Influenza Whole 03/10/2010   Influenza, High Dose Seasonal PF 05/01/2016, 05/06/2017, 06/13/2018   Influenza,inj,Quad PF,6+ Mos 06/13/2013   Influenza-Unspecified 04/19/2021   PFIZER(Purple Top)SARS-COV-2 Vaccination 11/25/2019, 12/16/2019   Pfizer Covid-19 Vaccine Bivalent Booster 60yrs & up 04/19/2021   Pneumococcal Conjugate-13 11/05/2014   Pneumococcal Polysaccharide-23 01/10/2014   Td 06/22/2001   Tdap 01/10/2014    TDAP status: Up to date  Flu Vaccine status: Up to date  Pneumococcal vaccine status: Up to date  Covid-19 vaccine status: Completed vaccines  Qualifies for Shingles Vaccine? Yes   Zostavax completed No   Shingrix Completed?: No.    Education has been provided regarding the importance of this vaccine. Patient has been advised to call insurance company to determine out of pocket expense if they have not yet received this vaccine. Advised may also receive vaccine at local pharmacy or Health Dept. Verbalized  acceptance and understanding.  Screening Tests Health Maintenance  Topic Date Due   DEXA SCAN  Never done   Zoster Vaccines- Shingrix (1 of 2) 10/09/2021 (Originally 12/14/1993)   COLONOSCOPY (Pts 45-41yrs Insurance coverage will need to  be confirmed)  03/28/2022   TETANUS/TDAP  01/11/2024   Pneumonia Vaccine 55+ Years old  Completed   INFLUENZA VACCINE  Completed   COVID-19 Vaccine  Completed   Hepatitis C Screening  Completed   HPV VACCINES  Aged Out    Health Maintenance  Health Maintenance Due  Topic Date Due   DEXA SCAN  Never done    Colorectal cancer screening: Type of screening: Colonoscopy. Completed 03/29/2019. Repeat every 3 years  Mammogram status: Ordered 07/11/2021. Pt provided with contact info and advised to call to schedule appt.   Bone Density status: Ordered 07/11/2021. Pt provided with contact info and advised to call to schedule appt.  Lung Cancer Screening: (Low Dose CT Chest recommended if Age 72-80 years, 30 pack-year currently smoking OR have quit w/in 15years.) does not qualify.   Lung Cancer Screening Referral: no  Additional Screening:  Hepatitis C Screening: does qualify; Completed yes  Vision Screening: Recommended annual ophthalmology exams for early detection of glaucoma and other disorders of the eye. Is the patient up to date with their annual eye exam?  Yes  Who is the provider or what is the name of the office in which the patient attends annual eye exams? [facility] on [address] If pt is not established with a provider, would they like to be referred to a provider to establish care? No .   Dental Screening: Recommended annual dental exams for proper oral hygiene  Community Resource Referral / Chronic Care Management: CRR required this visit?  No   CCM required this visit?  No      Plan:     I have personally reviewed and noted the following in the patients chart:   Medical and social history Use of alcohol, tobacco or illicit  drugs  Current medications and supplements including opioid prescriptions.  Functional ability and status Nutritional status Physical activity Advanced directives List of other physicians Hospitalizations, surgeries, and ER visits in previous 12 months Vitals Screenings to include cognitive, depression, and falls Referrals and appointments  In addition, I have reviewed and discussed with patient certain preventive protocols, quality metrics, and best practice recommendations. A written personalized care plan for preventive services as well as general preventive health recommendations were provided to patient.     Sheral Flow, LPN   4/88/8916   Nurse Notes:  Patient is cogitatively intact. There were no vitals filed for this visit. There is no height or weight on file to calculate BMI. Patient stated that she has no issues with gait or balance; does not use any assistive devices. Medications reviewed with patient; no opioid use noted.

## 2021-07-28 ENCOUNTER — Telehealth: Payer: Self-pay

## 2021-07-28 MED ORDER — PRAVASTATIN SODIUM 10 MG PO TABS: 10.0000 mg | ORAL_TABLET | Freq: Every day | ORAL | 5 refills | Status: DC

## 2021-07-28 NOTE — Telephone Encounter (Signed)
Spoke with patient today. 

## 2021-07-28 NOTE — Telephone Encounter (Signed)
Pt stated that she started taking rosuvastatin (CRESTOR) 5 MG tablet on 07/17/21  Pt states that she have been having headache, stomach soreness, weak, muscle aches, nauseous.  Pt states she didn't take over the weekend and it got better.  Pt is wanting to know if she can be prescribed  something else  Pt CB [phone]

## 2021-07-28 NOTE — Telephone Encounter (Signed)
Pravastatin 10 mg sent to pharmacy - try three times a week and increase to daily if tolerated

## 2021-08-28 ENCOUNTER — Other Ambulatory Visit (INDEPENDENT_AMBULATORY_CARE_PROVIDER_SITE_OTHER): Payer: Medicare Other

## 2021-08-28 DIAGNOSIS — E782 Mixed hyperlipidemia: Secondary | ICD-10-CM

## 2021-08-28 LAB — HEPATIC FUNCTION PANEL
ALT: 8 U/L (ref 0–35)
AST: 15 U/L (ref 0–37)
Albumin: 4.3 g/dL (ref 3.5–5.2)
Alkaline Phosphatase: 76 U/L (ref 39–117)
Bilirubin, Direct: 0.1 mg/dL (ref 0.0–0.3)
Total Bilirubin: 0.4 mg/dL (ref 0.2–1.2)
Total Protein: 7.5 g/dL (ref 6.0–8.3)

## 2021-08-28 LAB — LIPID PANEL
Cholesterol: 176 mg/dL (ref 0–200)
HDL: 52.9 mg/dL (ref >39.00)
LDL Cholesterol: 103 mg/dL — ABNORMAL HIGH (ref 0–99)
NonHDL: 123.47
Total CHOL/HDL Ratio: 3
Triglycerides: 100 mg/dL (ref 0.0–149.0)
VLDL: 20 mg/dL (ref 0.0–40.0)

## 2021-12-05 ENCOUNTER — Other Ambulatory Visit: Payer: Self-pay | Admitting: Internal Medicine

## 2022-04-15 ENCOUNTER — Encounter: Payer: Self-pay | Admitting: Gastroenterology

## 2022-06-22 ENCOUNTER — Encounter: Payer: Self-pay | Admitting: Internal Medicine

## 2022-06-22 NOTE — Progress Notes (Deleted)
    Subjective:    Patient ID: Darlene Park, female    DOB: 02-19-1946, 77 y.o.   MRN: [id number]      HPI Darlene Park is here for No chief complaint on file.   Joint pain  -      Medications and allergies reviewed with patient and updated if appropriate.  Current Outpatient Medications on File Prior to Visit  Medication Sig Dispense Refill   Acetaminophen (TYLENOL ARTHRITIS PAIN PO) Take 1 tablet by mouth.     amLODipine (NORVASC) 5 MG tablet Take 1 tablet (5 mg total) by mouth daily. 90 tablet 3   BENICAR 20 MG tablet Take 1 tablet (20 mg total) by mouth daily. 90 tablet 3   Cholecalciferol 1000 UNIT capsule Take 1,000 Units by mouth daily.     diclofenac sodium (VOLTAREN) 1 % GEL Apply 2 g topically 3 (three) times daily as needed. 100 g 1   pravastatin (PRAVACHOL) 10 MG tablet TAKE 1 TABLET BY MOUTH EVERY DAY 90 tablet 1   No current facility-administered medications on file prior to visit.    Review of Systems     Objective:  There were no vitals filed for this visit. BP Readings from Last 3 Encounters:  07/11/21 124/70  06/12/20 120/70  05/13/20 126/70   Wt Readings from Last 3 Encounters:  07/11/21 176 lb 3.2 oz (79.9 kg)  06/12/20 179 lb 9.6 oz (81.5 kg)  05/13/20 186 lb (84.4 kg)   There is no height or weight on file to calculate BMI.    Physical Exam         Assessment & Plan:    See Problem List for Assessment and Plan of chronic medical problems.

## 2022-06-23 ENCOUNTER — Ambulatory Visit: Payer: Medicare Other | Admitting: Internal Medicine

## 2022-06-24 ENCOUNTER — Ambulatory Visit: Payer: Medicare Other | Admitting: Internal Medicine

## 2022-07-03 ENCOUNTER — Ambulatory Visit: Payer: Medicare Other | Admitting: Internal Medicine

## 2022-07-16 ENCOUNTER — Encounter: Payer: Self-pay | Admitting: Internal Medicine

## 2022-07-16 NOTE — Patient Instructions (Addendum)
Blood work was ordered.   The lab is on the first floor.    Medications changes include :   none   A diagnostic mammogram and ultrasound of the breast was ordered.     Return in 1 year (on 07/18/2023) for Physical Exam.   Health Maintenance, Female Adopting a healthy lifestyle and getting preventive care are important in promoting health and wellness. Ask your health care provider about: The right schedule for you to have regular tests and exams. Things you can do on your own to prevent diseases and keep yourself healthy. What should I know about diet, weight, and exercise? Eat a healthy diet  Eat a diet that includes plenty of vegetables, fruits, low-fat dairy products, and lean protein. Do not eat a lot of foods that are high in solid fats, added sugars, or sodium. Maintain a healthy weight Body mass index (BMI) is used to identify weight problems. It estimates body fat based on height and weight. Your health care provider can help determine your BMI and help you achieve or maintain a healthy weight. Get regular exercise Get regular exercise. This is one of the most important things you can do for your health. Most adults should: Exercise for at least 150 minutes each week. The exercise should increase your heart rate and make you sweat (moderate-intensity exercise). Do strengthening exercises at least twice a week. This is in addition to the moderate-intensity exercise. Spend less time sitting. Even light physical activity can be beneficial. Watch cholesterol and blood lipids Have your blood tested for lipids and cholesterol at 79 years of age, then have this test every 5 years. Have your cholesterol levels checked more often if: Your lipid or cholesterol levels are high. You are older than 79 years of age. You are at high risk for heart disease. What should I know about cancer screening? Depending on your health history and family history, you may need to have  cancer screening at various ages. This may include screening for: Breast cancer. Cervical cancer. Colorectal cancer. Skin cancer. Lung cancer. What should I know about heart disease, diabetes, and high blood pressure? Blood pressure and heart disease High blood pressure causes heart disease and increases the risk of stroke. This is more likely to develop in people who have high blood pressure readings or are overweight. Have your blood pressure checked: Every 3-5 years if you are 41-51 years of age. Every year if you are 52 years old or older. Diabetes Have regular diabetes screenings. This checks your fasting blood sugar level. Have the screening done: Once every three years after age 72 if you are at a normal weight and have a low risk for diabetes. More often and at a younger age if you are overweight or have a high risk for diabetes. What should I know about preventing infection? Hepatitis B If you have a higher risk for hepatitis B, you should be screened for this virus. Talk with your health care provider to find out if you are at risk for hepatitis B infection. Hepatitis C Testing is recommended for: Everyone born from [birth year] through 1967. Anyone with known risk factors for hepatitis C. Sexually transmitted infections (STIs) Get screened for STIs, including gonorrhea and chlamydia, if: You are sexually active and are younger than 79 years of age. You are older than 79 years of age and your health care provider tells you that you are at risk for this type of infection. Your sexual activity  has changed since you were last screened, and you are at increased risk for chlamydia or gonorrhea. Ask your health care provider if you are at risk. Ask your health care provider about whether you are at high risk for HIV. Your health care provider may recommend a prescription medicine to help prevent HIV infection. If you choose to take medicine to prevent HIV, you should first get tested for HIV.  You should then be tested every 3 months for as long as you are taking the medicine. Pregnancy If you are about to stop having your period (premenopausal) and you may become pregnant, seek counseling before you get pregnant. Take 400 to 800 micrograms (mcg) of folic acid every day if you become pregnant. Ask for birth control (contraception) if you want to prevent pregnancy. Osteoporosis and menopause Osteoporosis is a disease in which the bones lose minerals and strength with aging. This can result in bone fractures. If you are 56 years old or older, or if you are at risk for osteoporosis and fractures, ask your health care provider if you should: Be screened for bone loss. Take a calcium or vitamin D supplement to lower your risk of fractures. Be given hormone replacement therapy (HRT) to treat symptoms of menopause. Follow these instructions at home: Alcohol use Do not drink alcohol if: Your health care provider tells you not to drink. You are pregnant, may be pregnant, or are planning to become pregnant. If you drink alcohol: Limit how much you have to: 0-1 drink a day. Know how much alcohol is in your drink. In the [place] one drink equals one 12 oz bottle of beer (355 mL), one 5 oz glass of wine (148 mL), or one 1 oz glass of hard liquor (44 mL). Lifestyle Do not use any products that contain nicotine or tobacco. These products include cigarettes, chewing tobacco, and vaping devices, such as e-cigarettes. If you need help quitting, ask your health care provider. Do not use street drugs. Do not share needles. Ask your health care provider for help if you need support or information about quitting drugs. General instructions Schedule regular health, dental, and eye exams. Stay current with your vaccines. Tell your health care provider if: You often feel depressed. You have ever been abused or do not feel safe at home. Summary Adopting a healthy lifestyle and getting preventive care  are important in promoting health and wellness. Follow your health care provider's instructions about healthy diet, exercising, and getting tested or screened for diseases. Follow your health care provider's instructions on monitoring your cholesterol and blood pressure. This information is not intended to replace advice given to you by your health care provider. Make sure you discuss any questions you have with your health care provider. Document Revised: 10/28/2020 Document Reviewed: 10/28/2020 Elsevier Patient Education  [place].

## 2022-07-16 NOTE — Progress Notes (Deleted)
    Subjective:    Patient ID: Darlene Park, female    DOB: 08-11-1945, 77 y.o.   MRN: [id number]      HPI Catrena is here for No chief complaint on file.    Joint pain -     Medications and allergies reviewed with patient and updated if appropriate.  Current Outpatient Medications on File Prior to Visit  Medication Sig Dispense Refill   Acetaminophen (TYLENOL ARTHRITIS PAIN PO) Take 1 tablet by mouth.     amLODipine (NORVASC) 5 MG tablet Take 1 tablet (5 mg total) by mouth daily. 90 tablet 3   BENICAR 20 MG tablet Take 1 tablet (20 mg total) by mouth daily. 90 tablet 3   Cholecalciferol 1000 UNIT capsule Take 1,000 Units by mouth daily.     diclofenac sodium (VOLTAREN) 1 % GEL Apply 2 g topically 3 (three) times daily as needed. 100 g 1   pravastatin (PRAVACHOL) 10 MG tablet TAKE 1 TABLET BY MOUTH EVERY DAY 90 tablet 1   No current facility-administered medications on file prior to visit.    Review of Systems     Objective:  There were no vitals filed for this visit. BP Readings from Last 3 Encounters:  07/11/21 124/70  06/12/20 120/70  05/13/20 126/70   Wt Readings from Last 3 Encounters:  07/11/21 176 lb 3.2 oz (79.9 kg)  06/12/20 179 lb 9.6 oz (81.5 kg)  05/13/20 186 lb (84.4 kg)   There is no height or weight on file to calculate BMI.    Physical Exam         Assessment & Plan:    See Problem List for Assessment and Plan of chronic medical problems.

## 2022-07-17 ENCOUNTER — Ambulatory Visit (INDEPENDENT_AMBULATORY_CARE_PROVIDER_SITE_OTHER): Payer: Medicare Other | Admitting: Internal Medicine

## 2022-07-17 VITALS — BP 134/74 | HR 78 | Temp 98.2°F | Ht 61.0 in | Wt 169.0 lb

## 2022-07-17 DIAGNOSIS — N6325 Unspecified lump in the left breast, overlapping quadrants: Secondary | ICD-10-CM | POA: Diagnosis not present

## 2022-07-17 DIAGNOSIS — Z0000 Encounter for general adult medical examination without abnormal findings: Secondary | ICD-10-CM | POA: Diagnosis not present

## 2022-07-17 DIAGNOSIS — E559 Vitamin D deficiency, unspecified: Secondary | ICD-10-CM | POA: Diagnosis not present

## 2022-07-17 DIAGNOSIS — Z13820 Encounter for screening for osteoporosis: Secondary | ICD-10-CM

## 2022-07-17 DIAGNOSIS — Z23 Encounter for immunization: Secondary | ICD-10-CM | POA: Diagnosis not present

## 2022-07-17 DIAGNOSIS — E782 Mixed hyperlipidemia: Secondary | ICD-10-CM

## 2022-07-17 DIAGNOSIS — N1831 Chronic kidney disease, stage 3a: Secondary | ICD-10-CM | POA: Diagnosis not present

## 2022-07-17 DIAGNOSIS — I10 Essential (primary) hypertension: Secondary | ICD-10-CM | POA: Diagnosis not present

## 2022-07-17 LAB — LIPID PANEL
Cholesterol: 168 mg/dL (ref 0–200)
HDL: 52.2 mg/dL (ref >39.00)
LDL Cholesterol: 101 mg/dL — ABNORMAL HIGH (ref 0–99)
NonHDL: 115.53
Total CHOL/HDL Ratio: 3
Triglycerides: 75 mg/dL (ref 0.0–149.0)
VLDL: 15 mg/dL (ref 0.0–40.0)

## 2022-07-17 LAB — COMPREHENSIVE METABOLIC PANEL
ALT: 8 U/L (ref 0–35)
AST: 15 U/L (ref 0–37)
Albumin: 4.4 g/dL (ref 3.5–5.2)
Alkaline Phosphatase: 83 U/L (ref 39–117)
BUN: 8 mg/dL (ref 6–23)
CO2: 26 mEq/L (ref 19–32)
Calcium: 9.7 mg/dL (ref 8.4–10.5)
Chloride: 101 mEq/L (ref 96–112)
Creatinine, Ser: 0.97 mg/dL (ref 0.40–1.20)
GFR: 55.94 mL/min — ABNORMAL LOW (ref >60.00)
Glucose, Bld: 82 mg/dL (ref 70–99)
Potassium: 3.9 mEq/L (ref 3.5–5.1)
Sodium: 137 mEq/L (ref 135–145)
Total Bilirubin: 0.5 mg/dL (ref 0.2–1.2)
Total Protein: 7.8 g/dL (ref 6.0–8.3)

## 2022-07-17 LAB — CBC WITH DIFFERENTIAL/PLATELET
Basophils Absolute: 0 10*3/uL (ref 0.0–0.1)
Basophils Relative: 0.7 % (ref 0.0–3.0)
Eosinophils Absolute: 0.2 10*3/uL (ref 0.0–0.7)
Eosinophils Relative: 2.9 % (ref 0.0–5.0)
HCT: 38.9 % (ref 36.0–46.0)
Hemoglobin: 13.1 g/dL (ref 12.0–15.0)
Lymphocytes Relative: 25.7 % (ref 12.0–46.0)
Lymphs Abs: 1.7 10*3/uL (ref 0.7–4.0)
MCHC: 33.7 g/dL (ref 30.0–36.0)
MCV: 88.9 fl (ref 78.0–100.0)
Monocytes Absolute: 0.5 10*3/uL (ref 0.1–1.0)
Monocytes Relative: 7.8 % (ref 3.0–12.0)
Neutro Abs: 4.2 10*3/uL (ref 1.4–7.7)
Neutrophils Relative %: 62.9 % (ref 43.0–77.0)
Platelets: 315 10*3/uL (ref 150.0–400.0)
RBC: 4.38 Mil/uL (ref 3.87–5.11)
RDW: 13.9 % (ref 11.5–15.5)
WBC: 6.7 10*3/uL (ref 4.0–10.5)

## 2022-07-17 LAB — TSH: TSH: 0.48 u[IU]/mL (ref 0.35–5.50)

## 2022-07-17 NOTE — Assessment & Plan Note (Signed)
Chronic Blood pressure well-controlled CMP, CBC

## 2022-07-17 NOTE — Assessment & Plan Note (Signed)
Chronic Blood pressure well controlled CMP Continue amlodipine 5 mg daily, Benicar 20 mg daily

## 2022-07-17 NOTE — Progress Notes (Signed)
Subjective:    Patient ID: Darlene Park, female    DOB: [birth year], 77 y.o.   MRN: [id number]      HPI Darlene Park is here for a Physical exam.    Feels anxious, stressed.  Has cyst in left breast.  She noticed it last November, but did not want to disturb the holidays.    Bp at home - checks it daily - 112/77, 130/80, 138/81, 130/79,   Medications and allergies reviewed with patient and updated if appropriate.  Current Outpatient Medications on File Prior to Visit  Medication Sig Dispense Refill   Acetaminophen (TYLENOL ARTHRITIS PAIN PO) Take 1 tablet by mouth.     amLODipine (NORVASC) 5 MG tablet Take 1 tablet (5 mg total) by mouth daily. 90 tablet 3   BENICAR 20 MG tablet Take 1 tablet (20 mg total) by mouth daily. 90 tablet 3   Cholecalciferol 1000 UNIT capsule Take 1,000 Units by mouth daily.     diclofenac sodium (VOLTAREN) 1 % GEL Apply 2 g topically 3 (three) times daily as needed. 100 g 1   pravastatin (PRAVACHOL) 10 MG tablet TAKE 1 TABLET BY MOUTH EVERY DAY 90 tablet 1   No current facility-administered medications on file prior to visit.    Review of Systems  Constitutional:  Negative for fever.  Eyes:  Negative for visual disturbance.  Respiratory:  Negative for cough, shortness of breath and wheezing.   Cardiovascular:  Negative for chest pain, palpitations and leg swelling (mild - goes down over night).  Gastrointestinal:  Negative for abdominal pain, blood in stool, constipation, diarrhea and nausea.       No gerd  Genitourinary:  Negative for dysuria.  Musculoskeletal:  Positive for arthralgias (left shoulder, left knee). Negative for back pain.  Skin:  Negative for rash.  Neurological:  Negative for light-headedness and headaches.  Psychiatric/Behavioral:  Negative for dysphoric mood. The patient is not nervous/anxious.        Objective:   Vitals:   07/17/22 0924 07/17/22 1012  BP: (!) 140/70 134/74  Pulse: 78   Temp: 98.2 F (36.8 C)    SpO2: 100%    Filed Weights   07/17/22 0924  Weight: 169 lb (76.7 kg)   Body mass index is 31.93 kg/m.  BP Readings from Last 3 Encounters:  07/17/22 134/74  07/11/21 124/70  06/12/20 120/70    Wt Readings from Last 3 Encounters:  07/17/22 169 lb (76.7 kg)  07/11/21 176 lb 3.2 oz (79.9 kg)  06/12/20 179 lb 9.6 oz (81.5 kg)       Physical Exam Constitutional: She appears well-developed and well-nourished. No distress.  HENT:  Head: Normocephalic and atraumatic.  Right Ear: External ear normal. Normal ear canal and TM Left Ear: External ear normal.  Normal ear canal and TM Mouth/Throat: Oropharynx is clear and moist.  Eyes: Conjunctivae normal.  Neck: Neck supple. No tracheal deviation present. No thyromegaly present.  No carotid bruit  Cardiovascular: Normal rate, regular rhythm and normal heart sounds.   No murmur heard.  No edema. Pulmonary/Chest: Effort normal and breath sounds normal. No respiratory distress. She has no wheezes. She has no rales.  Breast: Right breast without skin lesions, lumps, nipple discharge.  Left breast without skin changes, nipple discharge, large breast encompassing half of her breast Abdominal: Soft. She exhibits no distension. There is no tenderness.  Lymphadenopathy: She has no cervical adenopathy.  No left axillary lymphadenopathy Skin: Skin is warm and dry. She  is not diaphoretic.  Psychiatric: She has a normal mood and affect. Her behavior is normal.     Lab Results  Component Value Date   WBC 6.7 07/17/2022   HGB 13.1 07/17/2022   HCT 38.9 07/17/2022   PLT 315.0 07/17/2022   GLUCOSE 82 07/17/2022   CHOL 168 07/17/2022   TRIG 75.0 07/17/2022   HDL 52.20 07/17/2022   LDLCALC 101 (H) 07/17/2022   ALT 8 07/17/2022   AST 15 07/17/2022   NA 137 07/17/2022   K 3.9 07/17/2022   CL 101 07/17/2022   CREATININE 0.97 07/17/2022   BUN 8 07/17/2022   CO2 26 07/17/2022   TSH 0.48 07/17/2022         Assessment & Plan:    Physical exam: Screening blood work  ordered Exercise  not regular Weight  advised weight loss Substance abuse  none   Reviewed recommended immunizations.   Health Maintenance  Topic Date Due   Zoster Vaccines- Shingrix (1 of 2) Never done   DEXA SCAN  Never done   COVID-19 Vaccine (4 - 2023-24 season) 02/20/2022   COLONOSCOPY (Pts 45-49yr Insurance coverage will need to be confirmed)  03/28/2022   Medicare Annual Wellness (AWV)  07/16/2022   DTaP/Tdap/Td (3 - Td or Tdap) 01/11/2024   Pneumonia Vaccine 79 Years old  Completed   INFLUENZA VACCINE  Completed   Hepatitis C Screening  Completed   HPV VACCINES  Aged Out          See Problem List for Assessment and Plan of chronic medical problems.

## 2022-07-17 NOTE — Assessment & Plan Note (Signed)
Chronic Regular exercise and healthy diet encouraged Check lipid panel  Continue pravastatin 10 mg daily

## 2022-07-17 NOTE — Assessment & Plan Note (Signed)
Chronic Continue vitamin D supplementation 

## 2022-07-21 ENCOUNTER — Telehealth: Payer: Self-pay

## 2022-07-21 NOTE — Telephone Encounter (Signed)
Yes should be done at [facility] - changed order

## 2022-07-21 NOTE — Addendum Note (Signed)
Addended by: Binnie Rail on: 07/21/2022 05:14 PM   Modules accepted: Orders

## 2022-07-22 NOTE — Telephone Encounter (Signed)
Order faxed.

## 2022-07-29 DIAGNOSIS — Z780 Asymptomatic menopausal state: Secondary | ICD-10-CM | POA: Diagnosis not present

## 2022-07-29 DIAGNOSIS — N6489 Other specified disorders of breast: Secondary | ICD-10-CM | POA: Diagnosis not present

## 2022-07-29 DIAGNOSIS — M8589 Other specified disorders of bone density and structure, multiple sites: Secondary | ICD-10-CM | POA: Diagnosis not present

## 2022-07-29 DIAGNOSIS — R921 Mammographic calcification found on diagnostic imaging of breast: Secondary | ICD-10-CM | POA: Diagnosis not present

## 2022-07-29 LAB — HM MAMMOGRAPHY

## 2022-07-29 LAB — HM DEXA SCAN

## 2022-07-31 ENCOUNTER — Ambulatory Visit (INDEPENDENT_AMBULATORY_CARE_PROVIDER_SITE_OTHER): Payer: Medicare Other

## 2022-07-31 VITALS — Ht 61.0 in | Wt 169.0 lb

## 2022-07-31 DIAGNOSIS — Z0000 Encounter for general adult medical examination without abnormal findings: Secondary | ICD-10-CM | POA: Diagnosis not present

## 2022-07-31 NOTE — Patient Instructions (Addendum)
Darlene Park , Thank you for taking time to come for your Medicare Wellness Visit. I appreciate your ongoing commitment to your health goals. Please review the following plan we discussed and let me know if I can assist you in the future.   These are the goals we discussed:  Goals      My goal is to complete my colonoscopy and get dentures this year.  Also I just want to live in the moment and be very grateful.        This is a list of the screening recommended for you and due dates:  Health Maintenance  Topic Date Due   Zoster (Shingles) Vaccine (1 of 2) Never done   DEXA scan (bone density measurement)  Never done   COVID-19 Vaccine (5 - 2023-24 season) 02/20/2022   Colon Cancer Screening  03/28/2022   Medicare Annual Wellness Visit  08/01/2023   DTaP/Tdap/Td vaccine (3 - Td or Tdap) 01/11/2024   Pneumonia Vaccine  Completed   Flu Shot  Completed   Hepatitis C Screening: USPSTF Recommendation to screen - Ages 18-79 yo.  Completed   HPV Vaccine  Aged Out    Advanced directives: Yes; in process of getting legalized.  Conditions/risks identified: Yes  Next appointment: Follow up in one year for your annual wellness visit.   Preventive Care 42 Years and Older, Female Preventive care refers to lifestyle choices and visits with your health care provider that can promote health and wellness. What does preventive care include? A yearly physical exam. This is also called an annual well check. Dental exams once or twice a year. Routine eye exams. Ask your health care provider how often you should have your eyes checked. Personal lifestyle choices, including: Daily care of your teeth and gums. Regular physical activity. Eating a healthy diet. Avoiding tobacco and drug use. Limiting alcohol use. Practicing safe sex. Taking low-dose aspirin every day. Taking vitamin and mineral supplements as recommended by your health care provider. What happens during an annual well  check? The services and screenings done by your health care provider during your annual well check will depend on your age, overall health, lifestyle risk factors, and family history of disease. Counseling  Your health care provider may ask you questions about your: Alcohol use. Tobacco use. Drug use. Emotional well-being. Home and relationship well-being. Sexual activity. Eating habits. History of falls. Memory and ability to understand (cognition). Work and work Statistician. Reproductive health. Screening  You may have the following tests or measurements: Height, weight, and BMI. Blood pressure. Lipid and cholesterol levels. These may be checked every 5 years, or more frequently if you are over 30 years old. Skin check. Lung cancer screening. You may have this screening every year starting at age 52 if you have a 30-pack-year history of smoking and currently smoke or have quit within the past 15 years. Fecal occult blood test (FOBT) of the stool. You may have this test every year starting at age 46. Flexible sigmoidoscopy or colonoscopy. You may have a sigmoidoscopy every 5 years or a colonoscopy every 10 years starting at age 71. Hepatitis C blood test. Hepatitis B blood test. Sexually transmitted disease (STD) testing. Diabetes screening. This is done by checking your blood sugar (glucose) after you have not eaten for a while (fasting). You may have this done every 1-3 years. Bone density scan. This is done to screen for osteoporosis. You may have this done starting at age 23. Mammogram. This may be done  every 1-2 years. Talk to your health care provider about how often you should have regular mammograms. Talk with your health care provider about your test results, treatment options, and if necessary, the need for more tests. Vaccines  Your health care provider may recommend certain vaccines, such as: Influenza vaccine. This is recommended every year. Tetanus, diphtheria, and  acellular pertussis (Tdap, Td) vaccine. You may need a Td booster every 10 years. Zoster vaccine. You may need this after age 63. Pneumococcal 13-valent conjugate (PCV13) vaccine. One dose is recommended after age 7. Pneumococcal polysaccharide (PPSV23) vaccine. One dose is recommended after age 43. Talk to your health care provider about which screenings and vaccines you need and how often you need them. This information is not intended to replace advice given to you by your health care provider. Make sure you discuss any questions you have with your health care provider. Document Released: 07/05/2015 Document Revised: 02/26/2016 Document Reviewed: 04/09/2015 Elsevier Interactive Patient Education  2017 [place] Prevention in the Home Falls can cause injuries. They can happen to people of all ages. There are many things you can do to make your home safe and to help prevent falls. What can I do on the outside of my home? Regularly fix the edges of walkways and driveways and fix any cracks. Remove anything that might make you trip as you walk through a door, such as a raised step or threshold. Trim any bushes or trees on the path to your home. Use bright outdoor lighting. Clear any walking paths of anything that might make someone trip, such as rocks or tools. Regularly check to see if handrails are loose or broken. Make sure that both sides of any steps have handrails. Any raised decks and porches should have guardrails on the edges. Have any leaves, snow, or ice cleared regularly. Use sand or salt on walking paths during winter. Clean up any spills in your garage right away. This includes oil or grease spills. What can I do in the bathroom? Use night lights. Install grab bars by the toilet and in the tub and shower. Do not use towel bars as grab bars. Use non-skid mats or decals in the tub or shower. If you need to sit down in the shower, use a plastic, non-slip stool. Keep  the floor dry. Clean up any water that spills on the floor as soon as it happens. Remove soap buildup in the tub or shower regularly. Attach bath mats securely with double-sided non-slip rug tape. Do not have throw rugs and other things on the floor that can make you trip. What can I do in the bedroom? Use night lights. Make sure that you have a light by your bed that is easy to reach. Do not use any sheets or blankets that are too big for your bed. They should not hang down onto the floor. Have a firm chair that has side arms. You can use this for support while you get dressed. Do not have throw rugs and other things on the floor that can make you trip. What can I do in the kitchen? Clean up any spills right away. Avoid walking on wet floors. Keep items that you use a lot in easy-to-reach places. If you need to reach something above you, use a strong step stool that has a grab bar. Keep electrical cords out of the way. Do not use floor polish or wax that makes floors slippery. If you must use wax, use  non-skid floor wax. Do not have throw rugs and other things on the floor that can make you trip. What can I do with my stairs? Do not leave any items on the stairs. Make sure that there are handrails on both sides of the stairs and use them. Fix handrails that are broken or loose. Make sure that handrails are as long as the stairways. Check any carpeting to make sure that it is firmly attached to the stairs. Fix any carpet that is loose or worn. Avoid having throw rugs at the top or bottom of the stairs. If you do have throw rugs, attach them to the floor with carpet tape. Make sure that you have a light switch at the top of the stairs and the bottom of the stairs. If you do not have them, ask someone to add them for you. What else can I do to help prevent falls? Wear shoes that: Do not have high heels. Have rubber bottoms. Are comfortable and fit you well. Are closed at the toe. Do not  wear sandals. If you use a stepladder: Make sure that it is fully opened. Do not climb a closed stepladder. Make sure that both sides of the stepladder are locked into place. Ask someone to hold it for you, if possible. Clearly mark and make sure that you can see: Any grab bars or handrails. First and last steps. Where the edge of each step is. Use tools that help you move around (mobility aids) if they are needed. These include: Canes. Walkers. Scooters. Crutches. Turn on the lights when you go into a dark area. Replace any light bulbs as soon as they burn out. Set up your furniture so you have a clear path. Avoid moving your furniture around. If any of your floors are uneven, fix them. If there are any pets around you, be aware of where they are. Review your medicines with your doctor. Some medicines can make you feel dizzy. This can increase your chance of falling. Ask your doctor what other things that you can do to help prevent falls. This information is not intended to replace advice given to you by your health care provider. Make sure you discuss any questions you have with your health care provider. Document Released: 04/04/2009 Document Revised: 11/14/2015 Document Reviewed: 07/13/2014 Elsevier Interactive Patient Education  2017 [employer].

## 2022-07-31 NOTE — Progress Notes (Signed)
I connected with Darlene Park today by telephone and verified that I am speaking with the correct person using two identifiers. Location patient: home Location provider: work Persons participating in the virtual visit: patient, provider.   I discussed the limitations, risks, security and privacy concerns of performing an evaluation and management service by telephone and the availability of in person appointments. I also discussed with the patient that there may be a patient responsible charge related to this service. The patient expressed understanding and verbally consented to this telephonic visit.    Interactive audio and video telecommunications were attempted between this provider and patient, however failed, due to patient having technical difficulties OR patient did not have access to video capability.  We continued and completed visit with audio only.  Some vital signs may be absent or patient reported.   Time Spent with patient on telephone encounter: 30 minutes  Subjective:   Darlene Park is a 77 y.o. female who presents for Medicare Annual (Subsequent) preventive examination.  Review of Systems     Cardiac Risk Factors include: advanced age (>53mn, >>64women);dyslipidemia;hypertension;family history of premature cardiovascular disease;obesity (BMI >30kg/m2)     Objective:    Today's Vitals   07/31/22 1431  Weight: 169 lb (76.7 kg)  Height: 5' 1"$  (1.549 m)  PainSc: 0-No pain   Body mass index is 31.93 kg/m.     07/31/2022    2:35 PM 07/16/2021    2:52 PM 06/12/2020   10:16 AM  Advanced Directives  Does Patient Have a Medical Advance Directive? Yes No No  Type of AParamedicof ATrappeLiving will    Copy of HEast[place]in Chart? No - copy requested    Would patient like information on creating a medical advance directive?  Yes (MAU/Ambulatory/Procedural Areas - Information given) No - Patient declined     Current Medications (verified) Outpatient Encounter Medications as of 07/31/2022  Medication Sig   Acetaminophen (TYLENOL ARTHRITIS PAIN PO) Take 1 tablet by mouth.   amLODipine (NORVASC) 5 MG tablet Take 1 tablet (5 mg total) by mouth daily.   BENICAR 20 MG tablet Take 1 tablet (20 mg total) by mouth daily.   Cholecalciferol 1000 UNIT capsule Take 1,000 Units by mouth daily.   diclofenac sodium (VOLTAREN) 1 % GEL Apply 2 g topically 3 (three) times daily as needed.   pravastatin (PRAVACHOL) 10 MG tablet TAKE 1 TABLET BY MOUTH EVERY DAY   No facility-administered encounter medications on file as of 07/31/2022.    Allergies (verified) Penicillins and Crestor [rosuvastatin]   History: Past Medical History:  Diagnosis Date   Anemia    CHICKENPOX, HX OF 11/14/2008   Qualifier: Diagnosis of  By: BMarca AnconaRMA, Lucy     GERD    PT. DENIES.03/17/19   HYPERTENSION    [place]   resection; No chemo/adj tx per onc   Osteoarthritis of left knee    URINARY INCONTINENCE    VITAMIN D DEFICIENCY    Past Surgical History:  Procedure Laterality Date   ABDOMINAL HYSTERECTOMY  1976   [place]   COLONOSCOPY     HEMORRHOID SURGERY  1980's   POLYPECTOMY     Family History  Adopted: Yes  Problem Relation Age of Onset   Hypertension Other    Heart disease Other    Prostate cancer Other  Colon cancer Neg Hx    Esophageal cancer Neg Hx    Rectal cancer Neg Hx    Stomach cancer Neg Hx    Colon polyps Neg Hx    Social History   Socioeconomic History   Marital status: Divorced    Spouse name: Not on file   Number of children: Not on file   Years of education: Not on file   Highest education level: Not on file  Occupational History   Not on file  Tobacco Use   Smoking status: Never   Smokeless tobacco: Never  Vaping Use   Vaping Use: Never used  Substance and Sexual  Activity   Alcohol use: Yes    Alcohol/week: 0.0 standard drinks of alcohol    Comment: socially   Drug use: No   Sexual activity: Not on file  Other Topics Concern   Not on file  Social History Narrative   Retired from [employer] with daughter at her home   Social Determinants of Health   Financial Resource Strain: Low Risk  (07/31/2022)   Overall Financial Resource Strain (CARDIA)    Difficulty of Paying Living Expenses: Not hard at all  Food Insecurity: No Food Insecurity (07/31/2022)   Hunger Vital Sign    Worried About Running Out of Food in the Last Year: Never true    [place] in the Last Year: Never true  Transportation Needs: No Transportation Needs (07/31/2022)   PRAPARE - Hydrologist (Medical): No    Lack of Transportation (Non-Medical): No  Physical Activity: Sufficiently Active (07/31/2022)   Exercise Vital Sign    Days of Exercise per Week: 5 days    Minutes of Exercise per Session: 30 min  Stress: No Stress Concern Present (07/31/2022)   [name]    Feeling of Stress : Not at all  Social Connections: Moderately Integrated (07/31/2022)   Social Connection and Isolation Panel [NHANES]    Frequency of Communication with Friends and Family: More than three times a week    Frequency of Social Gatherings with Friends and Family: More than three times a week    Attends Religious Services: More than 4 times per year    Active Member of [employer] or Organizations: No    Attends Music therapist: More than 4 times per year    Marital Status: Widowed    Tobacco Counseling Counseling given: Not Answered   Clinical Intake:  Pre-visit preparation completed: Yes  Pain : No/denies pain Pain Score: 0-No pain     BMI - recorded: 31.93 Nutritional Status: BMI > 30  Obese Nutritional Risks: None Diabetes: No  How often do you need to have  someone help you when you read instructions, pamphlets, or other written materials from your doctor or pharmacy?: 1 - Never What is the last grade level you completed in school?: HSG; 2 years of college, 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 no  Interpreter Needed?: No  Information entered by :: Lisette Abu, LPN.   Activities of Daily Living    07/31/2022    2:40 PM  In your present state of health, do you have any difficulty performing the following activities:  Hearing? 0  Vision? 0  Difficulty concentrating or making decisions? 0  Walking or climbing stairs? 0  Dressing or bathing? 0  Doing errands, shopping? 0  Preparing Food and eating ?  N  Using the Toilet? N  In the past six months, have you accidently leaked urine? N  Do you have problems with loss of bowel control? N  Managing your Medications? N  Managing your Finances? N  Housekeeping or managing your Housekeeping? N    Patient Care Team: Binnie Rail, MD as PCP - General (Internal Medicine) Inda Castle, MD (Inactive) as Consulting Physician (Gastroenterology)  Indicate any recent Medical Services you may have received from other than [facility] providers in the past year (date may be approximate).     Assessment:   This is a routine wellness examination for Shoshana.  Hearing/Vision screen Hearing Screening - Comments:: Denies hearing difficulties   Vision Screening - Comments:: Wears rx glasses - up to date with routine eye exams with [facility] on [address]   Dietary issues and exercise activities discussed: Current Exercise Habits: Home exercise routine, Type of exercise: walking, Time (Minutes): 25, Frequency (Times/Week): 7, Weekly Exercise (Minutes/Week): 175, Intensity: Moderate, Exercise limited by: None identified   Goals Addressed             This Visit's Progress    My goal is to complete my colonoscopy and get dentures this year.  Also I just want to live in the moment and be very  grateful.        Depression Screen    07/31/2022    2:36 PM 07/17/2022    9:28 AM 07/16/2021    2:52 PM 07/11/2021   12:18 PM 06/12/2020   10:15 AM 05/13/2020    9:25 AM 01/19/2018    8:14 AM  PHQ 2/9 Scores  PHQ - 2 Score 0 0 1 1 0 0 1  PHQ- 9 Score 0 0 4 4  2     $ Fall Risk    07/31/2022    2:36 PM 07/17/2022    9:28 AM 07/16/2021    2:52 PM 07/11/2021    8:40 AM 06/12/2020   10:16 AM  Fall Risk   Falls in the past year? 0 0 0 0 0  Number falls in past yr: 0 0 0 0 0  Injury with Fall? 0 0 0 0 0  Risk for fall due to : No Fall Risks No Fall Risks No Fall Risks No Fall Risks No Fall Risks  Follow up Falls prevention discussed Falls evaluation completed Falls evaluation completed Falls evaluation completed Falls evaluation completed    FALL RISK PREVENTION PERTAINING TO THE HOME:  Any stairs in or around the home? No  If so, are there any without handrails? No  Home free of loose throw rugs in walkways, pet beds, electrical cords, etc? Yes  Adequate lighting in your home to reduce risk of falls? Yes   ASSISTIVE DEVICES UTILIZED TO PREVENT FALLS:  Life alert? No  Use of a cane, walker or w/c? No  Grab bars in the bathroom? Yes  Shower chair or bench in shower? Yes  Elevated toilet seat or a handicapped toilet? Yes   TIMED UP AND GO:  Was the test performed? No . Phone Visit  Cognitive Function:        07/31/2022    2:38 PM  6CIT Screen  What Year? 0 points  What month? 0 points  What time? 0 points  Count back from 20 0 points  Months in reverse 0 points  Repeat phrase 0 points  Total Score 0 points    Immunizations Immunization History  Administered Date(s) Administered   Fluad  Quad(high Dose 65+) 02/17/2019, 05/13/2020, 07/17/2022   Influenza Split 03/26/2011, 04/14/2012   Influenza Whole 03/10/2010   Influenza, High Dose Seasonal PF 05/01/2016, 05/06/2017, 06/13/2018   Influenza,inj,Quad PF,6+ Mos 06/13/2013   Influenza-Unspecified 04/19/2021    PFIZER(Purple Top)SARS-COV-2 Vaccination 11/25/2019, 12/16/2019, 06/20/2020   Pfizer Covid-19 Vaccine Bivalent Booster 78yr & up 04/19/2021   Pneumococcal Conjugate-13 11/05/2014   Pneumococcal Polysaccharide-23 01/10/2014   Td 06/22/2001   Tdap 01/10/2014    TDAP status: Up to date  Flu Vaccine status: Up to date  Pneumococcal vaccine status: Up to date  Covid-19 vaccine status: Completed vaccines  Qualifies for Shingles Vaccine? Yes   Zostavax completed No   Shingrix Completed?: No.    Education has been provided regarding the importance of this vaccine. Patient has been advised to call insurance company to determine out of pocket expense if they have not yet received this vaccine. Advised may also receive vaccine at local pharmacy or Health Dept. Verbalized acceptance and understanding.  Screening Tests Health Maintenance  Topic Date Due   Zoster Vaccines- Shingrix (1 of 2) Never done   DEXA SCAN  Never done   COVID-19 Vaccine (5 - 2023-24 season) 02/20/2022   COLONOSCOPY (Pts 45-424yrInsurance coverage will need to be confirmed)  03/28/2022   Medicare Annual Wellness (AWV)  08/01/2023   DTaP/Tdap/Td (3 - Td or Tdap) 01/11/2024   Pneumonia Vaccine 6582Years old  Completed   INFLUENZA VACCINE  Completed   Hepatitis C Screening  Completed   HPV VACCINES  Aged Out    Health Maintenance  Health Maintenance Due  Topic Date Due   Zoster Vaccines- Shingrix (1 of 2) Never done   DEXA SCAN  Never done   COVID-19 Vaccine (5 - 2023-24 season) 02/20/2022   COLONOSCOPY (Pts [id number] coverage will need to be confirmed)  03/28/2022    Colorectal cancer screening: Type of screening: Colonoscopy. Completed 03/29/2019. Repeat every 3 years (patient is in process of scheduling)  Mammogram status: Completed 07/29/2022. Repeat every year  Bone Density status: Completed 07/29/2022; results pending and will be faxed to Dr. StaBilley GoslingLung Cancer Screening: (Low Dose CT Chest  recommended if Age 69-52-80ars, 30 pack-year currently smoking OR have quit w/in 15years.) does not qualify.   Lung Cancer Screening Referral: no  Additional Screening:  Hepatitis C Screening: does qualify; Completed 11/05/2014  Vision Screening: Recommended annual ophthalmology exams for early detection of glaucoma and other disorders of the eye. Is the patient up to date with their annual eye exam?  Yes  Who is the provider or what is the name of the office in which the patient attends annual eye exams? [facility] pt is not established with a provider, would they like to be referred to a provider to establish care? No .   Dental Screening: Recommended annual dental exams for proper oral hygiene  Community Resource Referral / Chronic Care Management: CRR required this visit?  No   CCM required this visit?  No      Plan:     I have personally reviewed and noted the following in the patient's chart:   Medical and social history Use of alcohol, tobacco or illicit drugs  Current medications and supplements including opioid prescriptions. Patient is not currently taking opioid prescriptions. Functional ability and status Nutritional status Physical activity Advanced directives List of other physicians Hospitalizations, surgeries, and ER visits in previous 12 months Vitals Screenings to include cognitive, depression, and falls Referrals  and appointments  In addition, I have reviewed and discussed with patient certain preventive protocols, quality metrics, and best practice recommendations. A written personalized care plan for preventive services as well as general preventive health recommendations were provided to patient.     Sheral Flow, LPN   [id number]   Nurse Notes: N/A

## 2022-08-04 ENCOUNTER — Other Ambulatory Visit: Payer: Self-pay | Admitting: Internal Medicine

## 2022-08-04 DIAGNOSIS — I10 Essential (primary) hypertension: Secondary | ICD-10-CM

## 2022-08-05 ENCOUNTER — Encounter: Payer: Self-pay | Admitting: Internal Medicine

## 2022-08-05 ENCOUNTER — Other Ambulatory Visit: Payer: Self-pay

## 2022-08-05 DIAGNOSIS — C50412 Malignant neoplasm of upper-outer quadrant of left female breast: Secondary | ICD-10-CM | POA: Diagnosis not present

## 2022-08-05 DIAGNOSIS — C50912 Malignant neoplasm of unspecified site of left female breast: Secondary | ICD-10-CM | POA: Diagnosis not present

## 2022-08-05 DIAGNOSIS — M8580 Other specified disorders of bone density and structure, unspecified site: Secondary | ICD-10-CM | POA: Insufficient documentation

## 2022-08-07 ENCOUNTER — Telehealth: Payer: Self-pay | Admitting: Hematology and Oncology

## 2022-08-07 NOTE — Telephone Encounter (Signed)
Left message for patient to return my call in reference to upcoming [facility] appointment on 2/21

## 2022-08-07 NOTE — Telephone Encounter (Signed)
Spoke to patient to confirm upcoming morning [facility] clinic appointment on 2/21, paperwork will be sent via [place].   Gave location and time, also informed patient that the surgeon's office would be calling as well to get information from them similar to the packet that they will be receiving so make sure to do both.  Reminded patient that all providers will be coming to the clinic to see them HERE and if they had any questions to not hesitate to reach back out to myself or their navigators.

## 2022-08-10 ENCOUNTER — Encounter: Payer: Self-pay | Admitting: *Deleted

## 2022-08-10 DIAGNOSIS — Z170 Estrogen receptor positive status [ER+]: Secondary | ICD-10-CM | POA: Insufficient documentation

## 2022-08-11 ENCOUNTER — Encounter: Payer: Self-pay | Admitting: Internal Medicine

## 2022-08-11 NOTE — Progress Notes (Incomplete)
Radiation Oncology         [phone] ________________________________  Multidisciplinary Breast Oncology Clinic [facility]) Initial Outpatient Consultation  Name: Darlene Park MRN: [id number]  Date: 08/12/2022  DOB: 1946/03/27  [id number], Claudina Lick, MD  Rolm Bookbinder, MD   REFERRING PHYSICIAN: Rolm Bookbinder, MD  DIAGNOSIS: There were no encounter diagnoses.  Stage *** Central Left Breast, Invasive ductal carcinoma with extensive intermediate grade DCIS, ER+ / PR+ / Her2-, Grade 2  No diagnosis found.  HISTORY OF PRESENT ILLNESS::Darlene Park is a 77 y.o. female who is presenting to the office today for evaluation of her newly diagnosed breast cancer. She is accompanied by ***. She is doing well overall.   The patient presented with a new palpable left breast lump earlier this month. She underwent bilateral diagnostic mammography with tomography and left breast ultrasonography at [facility] on 07/29/22 showing: a highly suspicious 6.6 cm oval partially solid mass in the central left breast at a middle depth. The mass appears to measure 7.7 cm in the greatest extent mammographically, and 6.6 cm in the greatest extent sonographically. No abnormal left axillary lymph nodes were appreciated.   Biopsy of the central left breast on 08/05/22 showed: grade 2 invasive ductal carcinoma measuring 0.4 cm in the greatest linear extent of the sample, with extensive intermediate grade DCIS with papillary features. Prognostic indicators significant for: estrogen receptor, 95% positive and progesterone receptor, 100% positive, both with strong staining intensity. Proliferation marker Ki67 at 2%. HER2 negative.  Menarche: *** years old Age at first live birth: *** years old GP: *** LMP: *** Contraceptive: *** HRT: ***   The patient was referred today for presentation in the multidisciplinary conference.  Radiology studies and pathology slides were presented there for review and  discussion of treatment options.  A consensus was discussed regarding potential next steps.  PREVIOUS RADIATION THERAPY: {EXAM; YES/NO:19492::"No"}  PAST MEDICAL HISTORY:  Past Medical History:  Diagnosis Date   Anemia    CHICKENPOX, HX OF 11/14/2008   Qualifier: Diagnosis of  By: Marca Ancona RMA, Lucy     GERD    PT. DENIES.03/17/19   HYPERTENSION    Malignant neoplasm of cecum ([place]) 03/28/2009   resection; No chemo/adj tx per onc   Osteoarthritis of left knee    URINARY INCONTINENCE    VITAMIN D DEFICIENCY     PAST SURGICAL HISTORY: Past Surgical History:  Procedure Laterality Date   ABDOMINAL HYSTERECTOMY  1976   Partial precancer cells done @ [facility] in [place]  12/2008   COLONOSCOPY     HEMORRHOID SURGERY  1980's   POLYPECTOMY      FAMILY HISTORY:  Family History  Adopted: Yes  Problem Relation Age of Onset   Hypertension Other    Heart disease Other    Prostate cancer Other    Colon cancer Neg Hx    Esophageal cancer Neg Hx    Rectal cancer Neg Hx    Stomach cancer Neg Hx    Colon polyps Neg Hx     SOCIAL HISTORY:  Social History   Socioeconomic History   Marital status: Divorced    Spouse name: Not on file   Number of children: Not on file   Years of education: Not on file   Highest education level: Not on file  Occupational History   Not on file  Tobacco Use   Smoking status: Never   Smokeless tobacco: Never  Vaping Use  Vaping Use: Never used  Substance and Sexual Activity   Alcohol use: Yes    Alcohol/week: 0.0 standard drinks of alcohol    Comment: socially   Drug use: No   Sexual activity: Not on file  Other Topics Concern   Not on file  Social History Narrative   Retired from [employer] with daughter at her home   Social Determinants of Health   Financial Resource Strain: Low Risk  (07/31/2022)   Overall Financial Resource Strain (CARDIA)    Difficulty of Paying Living Expenses: Not hard at  all  Food Insecurity: No Food Insecurity (07/31/2022)   Hunger Vital Sign    Worried About Running Out of Food in the Last Year: Never true    [place] in the Last Year: Never true  Transportation Needs: No Transportation Needs (07/31/2022)   PRAPARE - Hydrologist (Medical): No    Lack of Transportation (Non-Medical): No  Physical Activity: Sufficiently Active (07/31/2022)   Exercise Vital Sign    Days of Exercise per Week: 5 days    Minutes of Exercise per Session: 30 min  Stress: No Stress Concern Present (07/31/2022)   [place]    Feeling of Stress : Not at all  Social Connections: Moderately Integrated (07/31/2022)   Social Connection and Isolation Panel [NHANES]    Frequency of Communication with Friends and Family: More than three times a week    Frequency of Social Gatherings with Friends and Family: More than three times a week    Attends Religious Services: More than 4 times per year    Active Member of [employer] or Organizations: No    Attends Music therapist: More than 4 times per year    Marital Status: Widowed    ALLERGIES:  Allergies  Allergen Reactions   Penicillins     REACTION: Rash, blisters   Crestor [Rosuvastatin] Other (See Comments)    headache, stomach soreness, weak, muscle aches, nauseous    MEDICATIONS:  Current Outpatient Medications  Medication Sig Dispense Refill   Acetaminophen (TYLENOL ARTHRITIS PAIN PO) Take 1 tablet by mouth.     amLODipine (NORVASC) 5 MG tablet TAKE 1 TABLET (5 MG TOTAL) BY MOUTH DAILY. 90 tablet 3   BENICAR 20 MG tablet TAKE 1 TABLET BY MOUTH EVERY DAY 90 tablet 3   Cholecalciferol 1000 UNIT capsule Take 1,000 Units by mouth daily.     diclofenac sodium (VOLTAREN) 1 % GEL Apply 2 g topically 3 (three) times daily as needed. 100 g 1   pravastatin (PRAVACHOL) 10 MG tablet TAKE 1 TABLET BY MOUTH EVERY DAY 90 tablet 1   No  current facility-administered medications for this encounter.    REVIEW OF SYSTEMS: A 10+ POINT REVIEW OF SYSTEMS WAS OBTAINED including neurology, dermatology, psychiatry, cardiac, respiratory, lymph, extremities, GI, GU, musculoskeletal, constitutional, reproductive, HEENT. On the provided form, she reports ***. She denies *** and any other symptoms.    PHYSICAL EXAM:  vitals were not taken for this visit.  {may need to copy over vitals} Lungs are clear to auscultation bilaterally. Heart has regular rate and rhythm. No palpable cervical, supraclavicular, or axillary adenopathy. Abdomen soft, non-tender, normal bowel sounds. Breast: *** breast with no palpable mass, nipple discharge, or bleeding. *** breast with ***.   KPS = ***  100 - Normal; no complaints; no evidence of disease. 90   -  Able to carry on normal activity; minor signs or symptoms of disease. 80   - Normal activity with effort; some signs or symptoms of disease. 17   - Cares for self; unable to carry on normal activity or to do active work. 60   - Requires occasional assistance, but is able to care for most of his personal needs. 50   - Requires considerable assistance and frequent medical care. 64   - Disabled; requires special care and assistance. 16   - Severely disabled; hospital admission is indicated although death not imminent. 31   - Very sick; hospital admission necessary; active supportive treatment necessary. 10   - Moribund; fatal processes progressing rapidly. 0     - Dead  Karnofsky DA, Abelmann Moro, Craver LS and Burchenal [facility] [phone]) The use of the nitrogen mustards in the palliative treatment of carcinoma: with particular reference to bronchogenic carcinoma Cancer 1 634-56  LABORATORY DATA:  Lab Results  Component Value Date   WBC 6.7 07/17/2022   HGB 13.1 07/17/2022   HCT 38.9 07/17/2022   MCV 88.9 07/17/2022   PLT 315.0 07/17/2022   Lab Results  Component Value Date   NA 137 07/17/2022   K 3.9  07/17/2022   CL 101 07/17/2022   CO2 26 07/17/2022   Lab Results  Component Value Date   ALT 8 07/17/2022   AST 15 07/17/2022   ALKPHOS 83 07/17/2022   BILITOT 0.5 07/17/2022    PULMONARY FUNCTION TEST:   Review Flowsheet        No data to display          RADIOGRAPHY: No results found.    IMPRESSION: *** {DIAGNOSIS HERE}  Patient will be a good candidate for breast conservation with radiotherapy to the left breast. We discussed the general course of radiation, potential side effects, and toxicities with radiation and the patient is interested in this approach. ***   PLAN:  ***    ------------------------------------------------  Blair Promise, PhD, MD  This document serves as a record of services personally performed by Gery Pray, MD. It was created on his behalf by Roney Mans, a trained medical scribe. The creation of this record is based on the scribe's personal observations and the provider's statements to them. This document has been checked and approved by the attending provider.

## 2022-08-12 ENCOUNTER — Ambulatory Visit: Payer: Medicare Other | Attending: General Surgery | Admitting: Physical Therapy

## 2022-08-12 ENCOUNTER — Encounter: Payer: Self-pay | Admitting: Hematology and Oncology

## 2022-08-12 ENCOUNTER — Ambulatory Visit
Admission: RE | Admit: 2022-08-12 | Discharge: 2022-08-12 | Disposition: A | Discharge status: Home / Self Care | Payer: Medicare Other | Source: Ambulatory Visit | Attending: Radiation Oncology | Admitting: Radiation Oncology

## 2022-08-12 ENCOUNTER — Inpatient Hospital Stay: Payer: Medicare Other | Admitting: Licensed Clinical Social Worker

## 2022-08-12 ENCOUNTER — Encounter: Payer: Self-pay | Admitting: Physical Therapy

## 2022-08-12 ENCOUNTER — Inpatient Hospital Stay: Payer: Medicare Other | Attending: Hematology and Oncology

## 2022-08-12 ENCOUNTER — Inpatient Hospital Stay (HOSPITAL_BASED_OUTPATIENT_CLINIC_OR_DEPARTMENT_OTHER): Payer: Medicare Other | Admitting: Hematology and Oncology

## 2022-08-12 ENCOUNTER — Encounter: Payer: Self-pay | Admitting: *Deleted

## 2022-08-12 ENCOUNTER — Inpatient Hospital Stay (HOSPITAL_BASED_OUTPATIENT_CLINIC_OR_DEPARTMENT_OTHER): Payer: Medicare Other | Admitting: Genetic Counselor

## 2022-08-12 ENCOUNTER — Other Ambulatory Visit: Payer: Self-pay

## 2022-08-12 ENCOUNTER — Telehealth: Payer: Self-pay | Admitting: Hematology and Oncology

## 2022-08-12 VITALS — BP 156/60 | HR 105 | Temp 97.9°F | Resp 16 | Ht 61.0 in | Wt 174.5 lb

## 2022-08-12 DIAGNOSIS — I10 Essential (primary) hypertension: Secondary | ICD-10-CM

## 2022-08-12 DIAGNOSIS — C50412 Malignant neoplasm of upper-outer quadrant of left female breast: Secondary | ICD-10-CM | POA: Diagnosis not present

## 2022-08-12 DIAGNOSIS — R293 Abnormal posture: Secondary | ICD-10-CM | POA: Insufficient documentation

## 2022-08-12 DIAGNOSIS — C50112 Malignant neoplasm of central portion of left female breast: Secondary | ICD-10-CM

## 2022-08-12 DIAGNOSIS — Z170 Estrogen receptor positive status [ER+]: Secondary | ICD-10-CM

## 2022-08-12 DIAGNOSIS — R262 Difficulty in walking, not elsewhere classified: Secondary | ICD-10-CM | POA: Diagnosis not present

## 2022-08-12 DIAGNOSIS — Z85038 Personal history of other malignant neoplasm of large intestine: Secondary | ICD-10-CM

## 2022-08-12 DIAGNOSIS — Z90710 Acquired absence of both cervix and uterus: Secondary | ICD-10-CM

## 2022-08-12 LAB — CBC WITH DIFFERENTIAL (CANCER CENTER ONLY)
Abs Immature Granulocytes: 0.02 10*3/uL (ref 0.00–0.07)
Basophils Absolute: 0.1 10*3/uL (ref 0.0–0.1)
Basophils Relative: 1 %
Eosinophils Absolute: 0.2 10*3/uL (ref 0.0–0.5)
Eosinophils Relative: 3 %
HCT: 35.5 % — ABNORMAL LOW (ref 36.0–46.0)
Hemoglobin: 12.2 g/dL (ref 12.0–15.0)
Immature Granulocytes: 0 %
Lymphocytes Relative: 28 %
Lymphs Abs: 1.7 10*3/uL (ref 0.7–4.0)
MCH: 29.9 pg (ref 26.0–34.0)
MCHC: 34.4 g/dL (ref 30.0–36.0)
MCV: 87 fL (ref 80.0–100.0)
Monocytes Absolute: 0.5 10*3/uL (ref 0.1–1.0)
Monocytes Relative: 8 %
Neutro Abs: 3.7 10*3/uL (ref 1.7–7.7)
Neutrophils Relative %: 60 %
Platelet Count: 334 10*3/uL (ref 150–400)
RBC: 4.08 MIL/uL (ref 3.87–5.11)
RDW: 12.8 % (ref 11.5–15.5)
WBC Count: 6.1 10*3/uL (ref 4.0–10.5)
nRBC: 0 % (ref 0.0–0.2)

## 2022-08-12 LAB — CMP (CANCER CENTER ONLY)
ALT: 9 U/L (ref 0–44)
AST: 17 U/L (ref 15–41)
Albumin: 4.3 g/dL (ref 3.5–5.0)
Alkaline Phosphatase: 84 U/L (ref 38–126)
Anion gap: 8 (ref 5–15)
BUN: 10 mg/dL (ref 8–23)
CO2: 27 mmol/L (ref 22–32)
Calcium: 9.4 mg/dL (ref 8.9–10.3)
Chloride: 103 mmol/L (ref 98–111)
Creatinine: 0.96 mg/dL (ref 0.44–1.00)
GFR, Estimated: >60 mL/min (ref >60)
Glucose, Bld: 95 mg/dL (ref 70–99)
Potassium: 3.8 mmol/L (ref 3.5–5.1)
Sodium: 138 mmol/L (ref 135–145)
Total Bilirubin: 0.5 mg/dL (ref 0.3–1.2)
Total Protein: 7.5 g/dL (ref 6.5–8.1)

## 2022-08-12 LAB — GENETIC SCREENING ORDER

## 2022-08-12 MED ORDER — ANASTROZOLE 1 MG PO TABS: 1.0000 mg | ORAL_TABLET | Freq: Every day | ORAL | 3 refills | Status: DC

## 2022-08-12 NOTE — Therapy (Signed)
OUTPATIENT PHYSICAL THERAPY BREAST CANCER BASELINE EVALUATION   Patient Name: Darlene Park MRN: [id number] DOB:05/29/1946, 77 y.o., female Today's Date: 08/12/2022  END OF SESSION:  PT End of Session - 08/12/22 1004     Visit Number 1    Number of Visits 2    Date for PT Re-Evaluation 04/12/23    PT Start Time [id number]    PT Stop Time 0959   Also saw pt from [id number] to 1047 for a total of 29 minutes   PT Time Calculation (min) 12 min    Activity Tolerance Patient tolerated treatment well    Behavior During Therapy [facility] for tasks assessed/performed             Past Medical History:  Diagnosis Date   Anemia    Breast cancer ([place])    CHICKENPOX, HX OF 11/14/2008   Qualifier: Diagnosis of  By: Marca Ancona RMA, Lucy     GERD    PT. DENIES.03/17/19   HYPERTENSION    Malignant neoplasm of cecum ([place]) 03/28/2009   resection; No chemo/adj tx per onc   Osteoarthritis of left knee    URINARY INCONTINENCE    VITAMIN D DEFICIENCY    Past Surgical History:  Procedure Laterality Date   ABDOMINAL HYSTERECTOMY  1976   Partial precancer cells done @ [facility] in [place]  12/2008   COLONOSCOPY     HEMORRHOID SURGERY  1980's   POLYPECTOMY     Patient Active Problem List   Diagnosis Date Noted   Malignant neoplasm of central portion of left breast in female, estrogen receptor positive ([place]) 08/10/2022   Osteopenia 08/05/2022   Hyperlipidemia 07/15/2021   Bilateral leg edema 05/13/2020   Chronic kidney disease (CKD), stage III (moderate) ([place]) 02/18/2019   Neck pain 01/19/2018   Osteoarthritis of left knee    Obese 09/25/2010   History of colon cancer, cecum 03/28/2009   Vitamin D deficiency 11/29/2008   Essential hypertension 11/14/2008    REFERRING PROVIDER: Dr. Rolm Bookbinder  REFERRING DIAG: Left breast cancer  THERAPY DIAG:  Malignant neoplasm of upper-outer quadrant of left breast in female, estrogen receptor positive ([place])  Abnormal  posture  Difficulty in walking, not elsewhere classified  Rationale for Evaluation and Treatment: Rehabilitation  ONSET DATE: 07/29/2022  SUBJECTIVE:                                                                                                                                                                                           SUBJECTIVE STATEMENT: Patient reports she is here today to be seen by her medical team  for her newly diagnosed left breast cancer.   PERTINENT HISTORY:  Patient was diagnosed on 07/29/2022 with left grade 2 invasive ductal carcinoma breast cancer. It measures 6.6 cm and is located in the upper outer quadrant. It is ER/PR positive and HER2 negative with a Ki67 of 2%. She had colon cancer in 2010 treated with a colon resection.  PATIENT GOALS:   reduce lymphedema risk and learn post op HEP.   PAIN:  Are you having pain? No  PRECAUTIONS: Active CA; hx colon cancer  HAND DOMINANCE: right  WEIGHT BEARING RESTRICTIONS: No  FALLS:  Has patient fallen in last 6 months? No  LIVING ENVIRONMENT: Patient lives with: her adult daughter Lives in: House/apartment Has following equipment at home: Single point cane used only sometimes for long distances or uneven terrain  OCCUPATION: Retired  LEISURE: She walks in her neighborhood 15-20 min 3x/week and does yardwork  PRIOR LEVEL OF FUNCTION: Independent   OBJECTIVE:  COGNITION: Overall cognitive status: Within functional limits for tasks assessed    POSTURE:  Forward head and rounded shoulders posture  UPPER EXTREMITY AROM/PROM:  A/PROM RIGHT   eval   Shoulder extension 45  Shoulder flexion 152  Shoulder abduction 158  Shoulder internal rotation 59  Shoulder external rotation 73    (Blank rows = not tested)  A/PROM LEFT   eval  Shoulder extension 49  Shoulder flexion 138  Shoulder abduction 155  Shoulder internal rotation 74  Shoulder external rotation 73    (Blank rows = not  tested)  CERVICAL AROM: All within normal limits:    Percent limited  Flexion WNL  Extension Limited 25%  Right lateral flexion Limited 25%  Left lateral flexion Limited 25%  Right rotation Limited 25%  Left rotation Limited 25%    UPPER EXTREMITY STRENGTH: WFL  LYMPHEDEMA ASSESSMENTS:   LANDMARK RIGHT   eval  10 cm proximal to olecranon process 31.9  Olecranon process 25.1  10 cm proximal to ulnar styloid process 23.2  Just proximal to ulnar styloid process 15.7  Across hand at thumb web space 18.9  At base of 2nd digit 6.5  (Blank rows = not tested)  LANDMARK LEFT   eval  10 cm proximal to olecranon process 33.4  Olecranon process 26.2  10 cm proximal to ulnar styloid process 23.7  Just proximal to ulnar styloid process 15.8  Across hand at thumb web space 18.7  At base of 2nd digit 6.1  (Blank rows = not tested)  L-DEX LYMPHEDEMA SCREENING:  The patient was assessed using the L-Dex machine today to produce a lymphedema index baseline score. The patient will be reassessed on a regular basis (typically every 3 months) to obtain new L-Dex scores. If the score is > 6.5 points away from his/her baseline score indicating onset of subclinical lymphedema, it will be recommended to wear a compression garment for 4 weeks, 12 hours per day and then be reassessed. If the score continues to be > 6.5 points from baseline at reassessment, we will initiate lymphedema treatment. Assessing in this manner has a 95% rate of preventing clinically significant lymphedema.   L-DEX FLOWSHEETS - 08/12/22 1100       L-DEX LYMPHEDEMA SCREENING   Measurement Type Unilateral    L-DEX MEASUREMENT EXTREMITY Upper Extremity    POSITION  Standing    DOMINANT SIDE Right    At Risk Side Left    BASELINE SCORE (UNILATERAL) 6.3  QUICK DASH SURVEY:  Katina Dung - 08/12/22 0001     Open a tight or new jar No difficulty    Do heavy household chores (wash walls, wash floors) No  difficulty    Carry a shopping bag or briefcase No difficulty    Wash your back No difficulty    Use a knife to cut food No difficulty    Recreational activities in which you take some force or impact through your arm, shoulder, or hand (golf, hammering, tennis) No difficulty    During the past week, to what extent has your arm, shoulder or hand problem interfered with your normal social activities with family, friends, neighbors, or groups? Not at all    During the past week, to what extent has your arm, shoulder or hand problem limited your work or other regular daily activities Not at all    Arm, shoulder, or hand pain. None    Tingling (pins and needles) in your arm, shoulder, or hand None    Difficulty Sleeping No difficulty    DASH Score 0 %              PATIENT EDUCATION:  Education details: Lymphedema risk reduction and post op shoulder/posture HEP Person educated: Patient Education method: Explanation, Demonstration, Handout Education comprehension: Patient verbalized understanding and returned demonstration  HOME EXERCISE PROGRAM: Patient was instructed today in a home exercise program today for post op shoulder range of motion. These included active assist shoulder flexion in sitting, scapular retraction, wall walking with shoulder abduction, and hands behind head external rotation.  She was encouraged to do these twice a day, holding 3 seconds and repeating 5 times when permitted by her physician.   ASSESSMENT:  CLINICAL IMPRESSION: Patient was diagnosed on 07/29/2022 with left grade 2 invasive ductal carcinoma breast cancer. It measures 6.6 cm and is located in the upper outer quadrant. It is ER/PR positive and HER2 negative with a Ki67 of 2%. She had colon cancer in 2010 treated with a colon resection. Her multidisciplinary medical team met prior to her assessments to determine a recommended treatment plan. She is planning to have neoadjuvant anti-estrogen therapy  followed by a left mastectomy and sentinel node biopsy. She will benefit from a post op PT reassessment to determine needs and from L-Dex screens every 3 months for 2 years to detect subclinical lymphedema.  Pt will benefit from skilled therapeutic intervention to improve on the following deficits: Decreased knowledge of precautions, impaired UE functional use, pain, decreased ROM, postural dysfunction.   PT treatment/interventions: ADL/self-care home management, pt/family education, therapeutic exercise  REHAB POTENTIAL: Excellent  CLINICAL DECISION MAKING: Stable/uncomplicated  EVALUATION COMPLEXITY: Low   GOALS: Goals reviewed with patient? YES  LONG TERM GOALS: (STG=LTG)    Name Target Date Goal status  1 Pt will be able to verbalize understanding of pertinent lymphedema risk reduction practices relevant to her dx specifically related to skin care.  Baseline:  No knowledge 08/12/2022 Achieved at eval  2 Pt will be able to return demo and/or verbalize understanding of the post op HEP related to regaining shoulder ROM. Baseline:  No knowledge 08/12/2022 Achieved at eval  3 Pt will be able to verbalize understanding of the importance of attending the post op After Breast CA Class for further lymphedema risk reduction education and therapeutic exercise.  Baseline:  No knowledge 08/12/2022 Achieved at eval  4 Pt will demo she has regained full shoulder ROM and function post operatively compared to baselines.  Baseline:  See objective measurements taken today. 02/10/2023     PLAN:  PT FREQUENCY/DURATION: EVAL and 1 follow up appointment.   PLAN FOR NEXT SESSION: will reassess 3-4 weeks post op to determine needs.   Patient will follow up at outpatient cancer rehab 3-4 weeks following surgery.  If the patient requires physical therapy at that time, a specific plan will be dictated and sent to the referring physician for approval. The patient was educated today on appropriate basic range  of motion exercises to begin post operatively and the importance of attending the After Breast Cancer class following surgery.  Patient was educated today on lymphedema risk reduction practices as it pertains to recommendations that will benefit the patient immediately following surgery.  She verbalized good understanding.    Physical Therapy Information for After Breast Cancer Surgery/Treatment:  Lymphedema is a swelling condition that you may be at risk for in your arm if you have lymph nodes removed from the armpit area.  After a sentinel node biopsy, the risk is approximately 5-9% and is higher after an axillary node dissection.  There is treatment available for this condition and it is not life-threatening.  Contact your physician or physical therapist with concerns. You may begin the 4 shoulder/posture exercises (see additional sheet) when permitted by your physician (typically a week after surgery).  If you have drains, you may need to wait until those are removed before beginning range of motion exercises.  A general recommendation is to not lift your arms above shoulder height until drains are removed.  These exercises should be done to your tolerance and gently.  This is not a "no pain/no gain" type of recovery so listen to your body and stretch into the range of motion that you can tolerate, stopping if you have pain.  If you are having immediate reconstruction, ask your plastic surgeon about doing exercises as he or she may want you to wait. We encourage you to attend the free one time ABC (After Breast Cancer) class offered by [facility].  You will learn information related to lymphedema risk, prevention and treatment and additional exercises to regain mobility following surgery.  You can call [phone] for more information.  This is offered the 1st and 3rd Monday of each month.  You only attend the class one time. While undergoing any medical procedure or treatment,  try to avoid blood pressure being taken or needle sticks from occurring on the arm on the side of cancer.   This recommendation begins after surgery and continues for the rest of your life.  This may help reduce your risk of getting lymphedema (swelling in your arm). An excellent resource for those seeking information on lymphedema is the National Lymphedema Network's web site. It can be accessed at [place].org If you notice swelling in your hand, arm or breast at any time following surgery (even if it is many years from now), please contact your doctor or physical therapist to discuss this.  Lymphedema can be treated at any time but it is easier for you if it is treated early on.  If you feel like your shoulder motion is not returning to normal in a reasonable amount of time, please contact your surgeon or physical therapist.  [facility] [phone]. [address], [address], [place] [address]  ABC CLASS After Breast Cancer Class  After Breast Cancer Class is a specially designed exercise class to assist you in a safe recover after  having breast cancer surgery.  In this class you will learn how to get back to full function whether your drains were just removed or if you had surgery a month ago.  This one-time class is held the 1st and 3rd Monday of every month from 11:00 a.m. until 12:00 noon virtually.  This class is FREE and space is limited. For more information or to register for the next available class, call [phone].  Class Goals  Understand specific stretches to improve the flexibility of you chest and shoulder. Learn ways to safely strengthen your upper body and improve your posture. Understand the warning signs of infection and why you may be at risk for an arm infection. Learn about Lymphedema and prevention.  ** You do not attend this class until after surgery.  Drains must be removed to participate  Patient was instructed today in  a home exercise program today for post op shoulder range of motion. These included active assist shoulder flexion in sitting, scapular retraction, wall walking with shoulder abduction, and hands behind head external rotation.  She was encouraged to do these twice a day, holding 3 seconds and repeating 5 times when permitted by her physician.  Annia Friendly, [place] 08/12/22 11:33 AM

## 2022-08-12 NOTE — Progress Notes (Signed)
Radiation Oncology         [phone] ________________________________  Multidisciplinary Breast Oncology Clinic [facility]) Initial Outpatient Consultation  Name: Darlene Park MRN: [id number]  Date: 08/12/2022  DOB: Dec 07, 1945  [id number], Claudina Lick, MD  Rolm Bookbinder, MD   REFERRING PHYSICIAN: Rolm Bookbinder, MD  DIAGNOSIS: The encounter diagnosis was Malignant neoplasm of central portion of left breast in female, estrogen receptor positive ([place]).  Stage IIA (cT3, cN0, cM0)  Central Left Breast, Invasive ductal carcinoma with extensive intermediate grade DCIS, ER+ / PR+ / Her2-, Grade 2     ICD-10-CM   1. Malignant neoplasm of central portion of left breast in female, estrogen receptor positive ([place])  C50.112    Z17.0       HISTORY OF PRESENT ILLNESS::Darlene Park is a 77 y.o. female who is presenting to the office today for evaluation of her newly diagnosed breast cancer. She is accompanied by her daughter. She is doing well overall.   The patient presented with a new palpable left breast lump earlier this month. She underwent bilateral diagnostic mammography with tomography and left breast ultrasonography at [facility] on 07/29/22 showing: a highly suspicious 6.6 cm oval partially solid mass in the central left breast at a middle depth. The mass appears to measure 7.7 cm in the greatest extent mammographically, and 6.6 cm in the greatest extent sonographically. No abnormal left axillary lymph nodes were appreciated.   Biopsy of the central left breast on 08/05/22 showed: grade 2 invasive ductal carcinoma measuring 0.4 cm in the greatest linear extent of the sample, with extensive intermediate grade DCIS with papillary features. Prognostic indicators significant for: estrogen receptor, 95% positive and progesterone receptor, 100% positive, both with strong staining intensity. Proliferation marker Ki67 at 2%. HER2 negative.  Menarche: 57 or 77 years old Age at first live  birth: 78 years old GP: 2 LMP: in 63-1978 Contraceptive: never used HRT: never used   The patient was referred today for presentation in the multidisciplinary conference.  Radiology studies and pathology slides were presented there for review and discussion of treatment options.  A consensus was discussed regarding potential next steps.  PREVIOUS RADIATION THERAPY: No  PAST MEDICAL HISTORY:  Past Medical History:  Diagnosis Date   Anemia    Breast cancer ([place])    CHICKENPOX, HX OF 11/14/2008   Qualifier: Diagnosis of  By: Marca Ancona RMA, Lucy     GERD    PT. DENIES.03/17/19   HYPERTENSION    Malignant neoplasm of cecum ([place]) 03/28/2009   resection; No chemo/adj tx per onc   Osteoarthritis of left knee    URINARY INCONTINENCE    VITAMIN D DEFICIENCY     PAST SURGICAL HISTORY: Past Surgical History:  Procedure Laterality Date   ABDOMINAL HYSTERECTOMY  1976   Partial precancer cells done @ [facility] in [place]  12/2008   COLONOSCOPY     HEMORRHOID SURGERY  1980's   POLYPECTOMY      FAMILY HISTORY:  Family History  Adopted: Yes  Problem Relation Age of Onset   Hypertension Other    Heart disease Other    Prostate cancer Other    Colon cancer Neg Hx    Esophageal cancer Neg Hx    Rectal cancer Neg Hx    Stomach cancer Neg Hx    Colon polyps Neg Hx     SOCIAL HISTORY:  Social History   Socioeconomic History   Marital status: Divorced  Spouse name: Not on file   Number of children: Not on file   Years of education: Not on file   Highest education level: Not on file  Occupational History   Not on file  Tobacco Use   Smoking status: Never   Smokeless tobacco: Never  Vaping Use   Vaping Use: Never used  Substance and Sexual Activity   Alcohol use: Yes    Alcohol/week: 0.0 standard drinks of alcohol    Comment: socially   Drug use: No   Sexual activity: Not on file  Other Topics Concern   Not on file  Social History Narrative    Retired from [employer] with daughter at her home   Social Determinants of Health   Financial Resource Strain: Low Risk  (07/31/2022)   Overall Financial Resource Strain (CARDIA)    Difficulty of Paying Living Expenses: Not hard at all  Food Insecurity: No Food Insecurity (07/31/2022)   Hunger Vital Sign    Worried About Running Out of Food in the Last Year: Never true    [place] in the Last Year: Never true  Transportation Needs: No Transportation Needs (07/31/2022)   PRAPARE - Hydrologist (Medical): No    Lack of Transportation (Non-Medical): No  Physical Activity: Sufficiently Active (07/31/2022)   Exercise Vital Sign    Days of Exercise per Week: 5 days    Minutes of Exercise per Session: 30 min  Stress: No Stress Concern Present (07/31/2022)   [place]    Feeling of Stress : Not at all  Social Connections: Moderately Integrated (07/31/2022)   Social Connection and Isolation Panel [NHANES]    Frequency of Communication with Friends and Family: More than three times a week    Frequency of Social Gatherings with Friends and Family: More than three times a week    Attends Religious Services: More than 4 times per year    Active Member of [employer] or Organizations: No    Attends Music therapist: More than 4 times per year    Marital Status: Widowed    ALLERGIES:  Allergies  Allergen Reactions   Penicillins     REACTION: Rash, blisters   Crestor [Rosuvastatin] Other (See Comments)    headache, stomach soreness, weak, muscle aches, nauseous    MEDICATIONS:  Current Outpatient Medications  Medication Sig Dispense Refill   Acetaminophen (TYLENOL ARTHRITIS PAIN PO) Take 1 tablet by mouth.     amLODipine (NORVASC) 5 MG tablet TAKE 1 TABLET (5 MG TOTAL) BY MOUTH DAILY. 90 tablet 3   BENICAR 20 MG tablet TAKE 1 TABLET BY MOUTH EVERY DAY 90 tablet 3    Cholecalciferol 1000 UNIT capsule Take 1,000 Units by mouth daily.     diclofenac sodium (VOLTAREN) 1 % GEL Apply 2 g topically 3 (three) times daily as needed. 100 g 1   pravastatin (PRAVACHOL) 10 MG tablet TAKE 1 TABLET BY MOUTH EVERY DAY 90 tablet 1   No current facility-administered medications for this encounter.    REVIEW OF SYSTEMS: A 10+ POINT REVIEW OF SYSTEMS WAS OBTAINED including neurology, dermatology, psychiatry, cardiac, respiratory, lymph, extremities, GI, GU, musculoskeletal, constitutional, reproductive, HEENT. On the provided form, she reports wearing glasses, wearing dentures, feet swelling, diverticulitis, changes in stool habits, and arthritis. She denies any other symptoms.    PHYSICAL EXAM:     08/12/2022  Vitals with BMI  Height 5' 1"$    Weight 174 lbs 8 oz   BMI [id number]   Systolic [id number] !   Diastolic 60 !   Pulse 105 !     Legend: ! Abnormal  Lungs are clear to auscultation bilaterally. Heart has regular rate and rhythm. No palpable cervical, supraclavicular, or axillary adenopathy. Abdomen soft, non-tender, normal bowel sounds. Breast: Right breast with no palpable mass, nipple discharge, or bleeding. Left breast with a large mass measuring 8 x 6 cm in the upper central breast area. Small dressing in place in the upper aspect of the breast with some serosanguinous drainage. No nipple discharge or bleeding.   KPS = 80  100 - Normal; no complaints; no evidence of disease. 90   - Able to carry on normal activity; minor signs or symptoms of disease. 80   - Normal activity with effort; some signs or symptoms of disease. 37   - Cares for self; unable to carry on normal activity or to do active work. 60   - Requires occasional assistance, but is able to care for most of his personal needs. 50   - Requires considerable assistance and frequent medical care. 20   - Disabled; requires special care and assistance. 53   - Severely disabled; hospital admission is indicated  although death not imminent. 18   - Very sick; hospital admission necessary; active supportive treatment necessary. 10   - Moribund; fatal processes progressing rapidly. 0     - Dead  Karnofsky DA, Abelmann [place], Craver LS and Burchenal [facility] [phone]) The use of the nitrogen mustards in the palliative treatment of carcinoma: with particular reference to bronchogenic carcinoma Cancer 1 634-56  LABORATORY DATA:  Lab Results  Component Value Date   WBC 6.1 08/12/2022   HGB 12.2 08/12/2022   HCT 35.5 (L) 08/12/2022   MCV 87.0 08/12/2022   PLT 334 08/12/2022   Lab Results  Component Value Date   NA 138 08/12/2022   K 3.8 08/12/2022   CL 103 08/12/2022   CO2 27 08/12/2022   Lab Results  Component Value Date   ALT 9 08/12/2022   AST 17 08/12/2022   ALKPHOS 84 08/12/2022   BILITOT 0.5 08/12/2022    PULMONARY FUNCTION TEST:   Review Flowsheet        No data to display          RADIOGRAPHY: No results found.    IMPRESSION: Stage IIA (cT3, cN0, cM0)  Central Left Breast, Invasive ductal carcinoma with extensive intermediate grade DCIS, ER+ / PR+ / Her2-, Grade 2  Based on the tumor size as it relates to breast dimension,  mastectomy is recommended.  Patient however would like to attempt neoadjuvant hormonal therapy to see if this mass can shrink allowing breast cancer surgery.  We discussed that radiation therapy would be a component of treatment either as breast conserving therapy or postmastectomy given the large tumor size at presentation.   PLAN:  Genetics MRI Neoadjuvant hormonal therapy Surgery TBD with SLN biopsies  Adjuvant radiation therapy    ------------------------------------------------  Blair Promise, PhD, MD  This document serves as a record of services personally performed by Gery Pray, MD. It was created on his behalf by Roney Mans, a trained medical scribe. The creation of this record is based on the scribe's personal observations and the  provider's statements to them. This document has been checked and approved by the attending provider.

## 2022-08-12 NOTE — Assessment & Plan Note (Signed)
This is a very pleasant 77 year old postmenopausal female patient with newly diagnosed left breast grade 2 IDC, ER/PR strongly positive HER2 negative, low proliferation index referred to breast [place] for additional recommendations. On review of imaging and physical exam, there appears to be a large mass measuring almost 7 to 8 cm occupying the entire central portion of the breast with some serosanguineous discharge.  When we reviewed her findings in the tumor board we have discussed about possible mastectomy. Patient however was understandably shocked at the recommendation of mastectomy and was wondering if there is any other intervention that can be done to elevate to reduce the possibility of having to go through mastectomy.  Since she has low proliferation index, strong ER/PR positive tumor, I do not believe she will respond very well to neoadjuvant chemotherapy.  We have however discussed about neoadjuvant antiestrogen therapy with aromatase inhibitors for about 6 months and reassess.  We have discussed the mechanism of action of aromatase inhibitors, adverse effects from aromatase inhibitors including but not limited to postmenopausal symptoms, bone density loss etc.  There is a questionable increased risk of cardiovascular events with antiestrogen therapy.  Given benefits versus risks, if she indeed wants to try something neoadjuvant, attempting neoadjuvant antiestrogen therapy is reasonable.  She can continue to follow-up with Dr. Donne Hazel in the interim and we can plan to reevaluate her in about 3 months to see if she benefited from this intervention.  She understands that it is still likely that she may end up having to go through mastectomy.  Although she seems to be in decent shape, I do not believe she is robust candidate for neoadjuvant Adriamycin based chemotherapy.  We have not discussed about Oncotype, Oncotype has not been studied in the large tumors over 5 cm.  I also do not believe she will be a  great candidate for adjuvant chemotherapy.  When asked about her opinion, she tells me that she is quite overwhelmed by the possibility of even having to go through surgery and she quite cannot think about chemotherapy at this time. I will start her on anastrozole neoadjuvant-she can return to clinic in about month for reevaluation and for toxicity check. Thank you for consulting [place] the care of this patient.  Please not hesitate to contact us with any additional questions or concerns.

## 2022-08-12 NOTE — Research (Signed)
Trial:  [employer] 2021-05 - Specimen Collection Study to Evaluate Biomarkers in Subjects with Cancer   Patient Darlene Park was identified by Dr. Chryl Heck as a potential candidate for the above listed study.  This Clinical Research Nurse met with Darlene Park, [id number], on 08/12/22 in a manner and location that ensures patient privacy to discuss participation in the above listed research study.  Patient is Accompanied by her daughter, Darlene Park .  A copy of the informed consent document with embedded HIPAA language was provided to the patient.  Patient reads, speaks, and understands [place].   Patient was provided with the business card of this Nurse and encouraged to contact the research team with any questions.  Approximately 5 minutes were spent with the patient reviewing the informed consent documents.  Patient was provided the option of taking informed consent documents home to review and was encouraged to review at their convenience with their support network, including other care providers. Patient took the consent documents home to review. Patient agreed for research nurse to call her next week to follow up.   Foye Spurling, BSN, RN, [facility] Nurse II [phone] 08/12/2022 11:11 AM

## 2022-08-12 NOTE — Telephone Encounter (Signed)
Left patient a vm regarding upcoming appointment

## 2022-08-12 NOTE — Progress Notes (Signed)
[facility] NOTE  Patient Care Team: Binnie Rail, MD as PCP - General (Internal Medicine) Inda Castle, MD (Inactive) as Consulting Physician (Gastroenterology) Rolm Bookbinder, MD as Consulting Physician (General Surgery) Benay Pike, MD as Consulting Physician (Hematology and Oncology) Gery Pray, MD as Consulting Physician (Radiation Oncology) Rockwell Germany, RN as Oncology Nurse Navigator Mauro Kaufmann, RN as Oncology Nurse Navigator  CHIEF COMPLAINTS/PURPOSE OF CONSULTATION:  Newly diagnosed breast cancer  HISTORY OF PRESENTING ILLNESS:  Darlene Park 77 y.o. female is here because of recent diagnosis of left IDC  I reviewed her records extensively and collaborated the history with the patient.  SUMMARY OF ONCOLOGIC HISTORY: Oncology History  Malignant neoplasm of central portion of left breast in female, estrogen receptor positive ([place])  07/29/2022 Mammogram   Bilateral diagnostic mammogram showed 7.7 x 5.8 x 6.8 cm oval high density mass in the left breast suspicious of malignancy.  Ultrasound-guided biopsy was recommended   08/05/2022 Pathology Results   Left breast needle core biopsy showed grade 2 IDC, ER positive strong staining 95%, PR 100% pos strong staining intensity, Ki 2%, Her 2 0   08/10/2022 Initial Diagnosis   Malignant neoplasm of central portion of left breast in female, estrogen receptor positive ([place])    Patient arrived to the appointment today with her daughter Darlene Park. She tells me that she hasn't had a mammogram in almost 13 years or 14 years.  She tells me that she did not feel bad, had no symptoms, did not feel anything in the breast hands did not think of going to get annual mammograms.  During the holidays, she has started noticing some changes in the breast but did not want to alarm her family.  She then has noticed that the lump started growing bigger in size.  It still does not give her any pain or any  other symptoms.  She had mammogram and ultrasound and biopsy as mentioned above which confirmed a grade 2 IDC, ER/PR strong staining, HER2 0 and KI of 2%.  She denies any known family history of breast cancers.  She denies any long-term use of hormone replacement or birth control.  She has 2 kids, youngest is in her 29s and lives with her.  Patient is retired, used to work in Pharmacist, hospital and later as a Oceanographer. She is still shocked by the diagnosis that this is malignant and that she may need surgery and other treatments.  She tells me that she is reasonably active although she has slowed down quite a bit since COVID.  Previously many months ago she was able to walk around 3 miles a day but has not done this recently.  She is independent with all her activities of daily living.  She continues to do some mental puzzles and games to keep herself mentally active.  Rest of the pertinent 10 point ROS reviewed and negative  MEDICAL HISTORY:  Past Medical History:  Diagnosis Date   Anemia    Breast cancer ([place])    CHICKENPOX, HX OF 11/14/2008   Qualifier: Diagnosis of  By: Marca Ancona RMA, Lucy     GERD    PT. DENIES.03/17/19   HYPERTENSION    Malignant neoplasm of cecum ([place]) 03/28/2009   resection; No chemo/adj tx per onc   Osteoarthritis of left knee    URINARY INCONTINENCE    VITAMIN D DEFICIENCY     SURGICAL HISTORY: Past Surgical History:  Procedure Laterality Date   ABDOMINAL  HYSTERECTOMY  1976   Partial precancer cells done @ [facility] in [place]  12/2008   COLONOSCOPY     HEMORRHOID SURGERY  1980's   POLYPECTOMY      SOCIAL HISTORY: Social History   Socioeconomic History   Marital status: Divorced    Spouse name: Not on file   Number of children: Not on file   Years of education: Not on file   Highest education level: Not on file  Occupational History   Not on file  Tobacco Use   Smoking status: Never   Smokeless tobacco: Never   Vaping Use   Vaping Use: Never used  Substance and Sexual Activity   Alcohol use: Yes    Alcohol/week: 0.0 standard drinks of alcohol    Comment: socially   Drug use: No   Sexual activity: Not on file  Other Topics Concern   Not on file  Social History Narrative   Retired from [employer] with daughter at her home   Social Determinants of Health   Financial Resource Strain: Low Risk  (07/31/2022)   Overall Financial Resource Strain (CARDIA)    Difficulty of Paying Living Expenses: Not hard at all  Food Insecurity: No Food Insecurity (07/31/2022)   Hunger Vital Sign    Worried About Running Out of Food in the Last Year: Never true    [place] in the Last Year: Never true  Transportation Needs: No Transportation Needs (07/31/2022)   PRAPARE - Hydrologist (Medical): No    Lack of Transportation (Non-Medical): No  Physical Activity: Sufficiently Active (07/31/2022)   Exercise Vital Sign    Days of Exercise per Week: 5 days    Minutes of Exercise per Session: 30 min  Stress: No Stress Concern Present (07/31/2022)   [place]    Feeling of Stress : Not at all  Social Connections: Moderately Integrated (07/31/2022)   Social Connection and Isolation Panel [NHANES]    Frequency of Communication with Friends and Family: More than three times a week    Frequency of Social Gatherings with Friends and Family: More than three times a week    Attends Religious Services: More than 4 times per year    Active Member of [employer] or Organizations: No    Attends Music therapist: More than 4 times per year    Marital Status: Widowed  Intimate Partner Violence: Not At Risk (07/31/2022)   Humiliation, Afraid, Rape, and Kick questionnaire    Fear of Current or Ex-Partner: No    Emotionally Abused: No    Physically Abused: No    Sexually Abused: No    FAMILY  HISTORY: Family History  Adopted: Yes  Problem Relation Age of Onset   Hypertension Other    Heart disease Other    Prostate cancer Other    Colon cancer Neg Hx    Esophageal cancer Neg Hx    Rectal cancer Neg Hx    Stomach cancer Neg Hx    Colon polyps Neg Hx     ALLERGIES:  is allergic to penicillins and crestor [rosuvastatin].  MEDICATIONS:  Current Outpatient Medications  Medication Sig Dispense Refill   Acetaminophen (TYLENOL ARTHRITIS PAIN PO) Take 1 tablet by mouth.     amLODipine (NORVASC) 5 MG tablet TAKE 1 TABLET (5 MG TOTAL) BY MOUTH DAILY. 90 tablet 3  BENICAR 20 MG tablet TAKE 1 TABLET BY MOUTH EVERY DAY 90 tablet 3   Cholecalciferol 1000 UNIT capsule Take 1,000 Units by mouth daily.     diclofenac sodium (VOLTAREN) 1 % GEL Apply 2 g topically 3 (three) times daily as needed. 100 g 1   pravastatin (PRAVACHOL) 10 MG tablet TAKE 1 TABLET BY MOUTH EVERY DAY 90 tablet 1   No current facility-administered medications for this visit.    REVIEW OF SYSTEMS:   Constitutional: Denies fevers, chills or abnormal night sweats Eyes: Denies blurriness of vision, double vision or watery eyes Ears, nose, mouth, throat, and face: Denies mucositis or sore throat Respiratory: Denies cough, dyspnea or wheezes Cardiovascular: Denies palpitation, chest discomfort or lower extremity swelling Gastrointestinal:  Denies nausea, heartburn or change in bowel habits Skin: Denies abnormal skin rashes Lymphatics: Denies new lymphadenopathy or easy bruising Neurological:Denies numbness, tingling or new weaknesses Behavioral/Psych: Mood is stable, no new changes  Breast: As mentioned above All other systems were reviewed with the patient and are negative.  PHYSICAL EXAMINATION: ECOG PERFORMANCE STATUS: 0 - Asymptomatic  Vitals:   08/12/22 0835  BP: (!) 156/60  Pulse: (!) 105  Resp: 16  Temp: 97.9 F (36.6 C)  SpO2: 100%   Filed Weights   08/12/22 0835  Weight: 174 lb 8 oz (79.2  kg)    GENERAL:alert, no distress and comfortable SKIN: skin color, texture, turgor are normal, no rashes or significant lesions EYES: normal, conjunctiva are pink and non-injected, sclera clear OROPHARYNX:no exudate, no erythema and lips, buccal mucosa, and tongue normal  NECK: supple, thyroid normal size, non-tender, without nodularity LYMPH:  no palpable lymphadenopathy in the cervical, axillary  LUNGS: clear to auscultation and percussion with normal breathing effort HEART: regular rate & rhythm and no murmurs and no lower extremity edema ABDOMEN:abdomen soft, non-tender and normal bowel sounds Musculoskeletal:no cyanosis of digits and no clubbing  PSYCH: alert & oriented x 3 with fluent speech NEURO: no focal motor/sensory deficits BREAST: Large palpable breast mass in the left breast occupying pretty much the entire central portion of the breast with some serosanguineous discharge at the site of biopsy.  No palpable regional adenopathy.  The mass is movable in the breast.   LABORATORY DATA:  I have reviewed the data as listed Lab Results  Component Value Date   WBC 6.1 08/12/2022   HGB 12.2 08/12/2022   HCT 35.5 (L) 08/12/2022   MCV 87.0 08/12/2022   PLT 334 08/12/2022   Lab Results  Component Value Date   NA 138 08/12/2022   K 3.8 08/12/2022   CL 103 08/12/2022   CO2 27 08/12/2022    RADIOGRAPHIC STUDIES: I have personally reviewed the radiological reports and agreed with the findings in the report.  ASSESSMENT AND PLAN:  Malignant neoplasm of central portion of left breast in female, estrogen receptor positive ([place]) This is a very pleasant 77 year old postmenopausal female patient with newly diagnosed left breast grade 2 IDC, ER/PR strongly positive HER2 negative, low proliferation index referred to breast [place] for additional recommendations. On review of imaging and physical exam, there appears to be a large mass measuring almost 7 to 8 cm occupying the entire central  portion of the breast with some serosanguineous discharge.  When we reviewed her findings in the tumor board we have discussed about possible mastectomy. Patient however was understandably shocked at the recommendation of mastectomy and was wondering if there is any other intervention that can be done to elevate to  reduce the possibility of having to go through mastectomy.  Since she has low proliferation index, strong ER/PR positive tumor, I do not believe she will respond very well to neoadjuvant chemotherapy.  We have however discussed about neoadjuvant antiestrogen therapy with aromatase inhibitors for about 6 months and reassess.  We have discussed the mechanism of action of aromatase inhibitors, adverse effects from aromatase inhibitors including but not limited to postmenopausal symptoms, bone density loss etc.  There is a questionable increased risk of cardiovascular events with antiestrogen therapy.  Given benefits versus risks, if she indeed wants to try something neoadjuvant, attempting neoadjuvant antiestrogen therapy is reasonable.  She can continue to follow-up with Dr. Donne Hazel in the interim and we can plan to reevaluate her in about 3 months to see if she benefited from this intervention.  She understands that it is still likely that she may end up having to go through mastectomy.  Although she seems to be in decent shape, I do not believe she is robust candidate for neoadjuvant Adriamycin based chemotherapy.  We have not discussed about Oncotype, Oncotype has not been studied in the large tumors over 5 cm.  I also do not believe she will be a great candidate for adjuvant chemotherapy.  When asked about her opinion, she tells me that she is quite overwhelmed by the possibility of even having to go through surgery and she quite cannot think about chemotherapy at this time. I will start her on anastrozole neoadjuvant-she can return to clinic in about month for reevaluation and for toxicity  check. Thank you for consulting [place] the care of this patient.  Please not hesitate to contact us with any additional questions or concerns.   All questions were answered. The patient knows to call the clinic with any problems, questions or concerns.    Benay Pike, MD 08/12/22

## 2022-08-12 NOTE — Progress Notes (Signed)
[facility]  Initial Assessment   Darlene Park is a 77 y.o. year old female accompanied by daughter- Darlene Park. Clinical Social Work was referred by  [facility]  for assessment of psychosocial needs.   SDOH (Social Determinants of Health) assessments performed: Yes- confirmed responses from 07/31/22 appointment SDOH Interventions    Flowsheet Row Clinical Support from 07/31/2022 in [place] at [place] from 07/16/2021 in [place] at [place] Visit from 07/11/2021 in [place] at [place] from 06/12/2020 in [place] at [place] Visit from 05/13/2020 in [place] at [place] Interventions Intervention Not Indicated Intervention Not Indicated -- Intervention Not Indicated --  Housing Interventions Intervention Not Indicated Intervention Not Indicated -- Intervention Not Indicated --  Transportation Interventions Intervention Not Indicated Intervention Not Indicated -- Intervention Not Indicated --  Utilities Interventions Intervention Not Indicated -- -- -- --  Alcohol Usage Interventions Intervention Not Indicated (Score <7) -- -- -- --  Depression Interventions/Treatment  -- Patient refuses Treatment Patient refuses Treatment -- PHQ2-9 Score <4 Follow-up Not Indicated  Financial Strain Interventions Intervention Not Indicated Intervention Not Indicated -- Intervention Not Indicated --  Physical Activity Interventions Intervention Not Indicated Intervention Not Indicated -- Intervention Not Indicated --  Stress Interventions Intervention Not Indicated Intervention Not Indicated -- Intervention Not Indicated --  Social Connections Interventions Intervention Not Indicated Intervention Not Indicated -- Intervention Not Indicated --       SDOH Screenings   Food Insecurity:  No Food Insecurity (07/31/2022)  Housing: Low Risk  (07/31/2022)  Transportation Needs: No Transportation Needs (07/31/2022)  Utilities: Not At Risk (07/31/2022)  Alcohol Screen: Low Risk  (07/31/2022)  Depression (PHQ2-9): Low Risk  (07/31/2022)  Financial Resource Strain: Low Risk  (07/31/2022)  Physical Activity: Sufficiently Active (07/31/2022)  Social Connections: Moderately Integrated (07/31/2022)  Stress: No Stress Concern Present (07/31/2022)  Tobacco Use: Low Risk  (08/12/2022)     Distress Screen completed: Yes    08/12/2022    1:20 PM  ONCBCN DISTRESS SCREENING  Screening Type Initial Screening  Distress experienced in past week (1-10) 6  Emotional problem type Nervousness/Anxiety;Adjusting to illness  Information Concerns Type Lack of info about diagnosis;Lack of info about treatment  Physical Problem type Sleep/insomnia;Loss of appetitie      Family/Social Information:  Housing Arrangement: patient lives with daughter, Darlene Park, and their dog Family members/support persons in your life? Family and Friends Transportation concerns: no  Employment: Retired.  Income source: Paediatric nurse concerns: No Type of concern: None Food access concerns: no Religious or spiritual practice: Not known Services Currently in place:  [facility] Medicare  Coping/ Adjustment to diagnosis: Patient understands treatment plan and what happens next? yes, is processing the news and plan from today Patient reported stressors: Anxiety/ nervousness and Adjusting to my illness Hopes and/or priorities: Being able to continue to live as fully as possible and age in place Patient enjoys  making jewlery, going to casino, word searches, HSN (TV), vintage items Current coping skills/ strengths: Active sense of humor , Communication skills , Motivation for treatment/growth , Special hobby/interest , and Supportive family/friends     SUMMARY: Current SDOH Barriers:  No significant barriers noted  today  Clinical Social Work Clinical Goal(s):  No clinical social work goals at this time  Interventions: Discussed common feeling and emotions  when being diagnosed with cancer, and the importance of support during treatment Informed patient of the support team roles and support services at [facility] Provided [place] contact information and encouraged patient to call with any questions or concerns   Follow Up Plan: Patient will contact CSW with any support or resource needs Patient verbalizes understanding of plan: Yes    Ramere Downs E Dante Cooter, LCSW

## 2022-08-12 NOTE — Progress Notes (Signed)
REFERRING PROVIDER: Benay Pike, MD [place],  [place] [address]  PRIMARY PROVIDER:  Binnie Rail, MD  PRIMARY REASON FOR VISIT:  Encounter Diagnoses  Name Primary?   Malignant neoplasm of central portion of left breast in female, estrogen receptor positive ([place]) Yes   History of colon cancer, cecum     HISTORY OF PRESENT ILLNESS:   Darlene Park, a 77 y.o. female, was seen for a [place] cancer genetics consultation at the request of Dr. Chryl Heck due to a personal history of breast and colon cancer.  Darlene Park presents to clinic today to discuss the possibility of a hereditary predisposition to cancer, to discuss genetic testing, and to further clarify her future cancer risks, as well as potential cancer risks for family members.   In 2024, at the age of 60, Darlene Park was diagnosed with invasive ductal carcinoma of the left breast (ER+/PR+/HER2-). The treatment plan is pending.  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    Past Medical History:  Diagnosis Date   Anemia    CHICKENPOX, HX OF 11/14/2008   Qualifier: Diagnosis of  By: Marca Ancona RMA, Lucy     GERD    PT. DENIES.03/17/19   HYPERTENSION    Malignant neoplasm of cecum ([place]) 03/28/2009   resection; No chemo/adj tx per onc   Osteoarthritis of left knee    URINARY INCONTINENCE    VITAMIN D DEFICIENCY     Past Surgical History:  Procedure Laterality Date   ABDOMINAL HYSTERECTOMY  1976   Partial precancer cells done @ [facility] in [place]  12/2008   COLONOSCOPY     HEMORRHOID SURGERY  1980's   POLYPECTOMY      FAMILY HISTORY:  Darlene Park did not report a known family history of cancer; however, she had limited information about her maternal and paternal family history.         Darlene Park is unaware of previous family history  of genetic testing for hereditary cancer risks. There is no reported Ashkenazi Jewish ancestry. There is no known consanguinity.  GENETIC COUNSELING ASSESSMENT: Darlene Park is a 77 y.o. female with a personal history of both colon and breast cancer and limited information about her family history. We, therefore, discussed and recommended the following at today's visit.   DISCUSSION:   We discussed that, in general, most cancer is not inherited in families, but instead is sporadic or familial. Sporadic cancers occur by chance and typically happen at older ages (>50 years) as this type of cancer is caused by genetic changes acquired during an individual's lifetime. Some families have more cancers than would be expected by chance; however, the ages or types of cancer are not consistent with a known genetic mutation or known genetic mutations have been ruled out. This type of familial cancer is thought to be due to a combination of multiple genetic, environmental, hormonal, and lifestyle factors. While this combination of factors likely increases the risk of cancer, the exact source of this risk is not currently identifiable or testable.    We discussed that approximately 5-10% of cancer cancer is hereditary, meaning that it is due to a mutation in a single gene that is passed down from generation to generation in a family. Most hereditary cases of breast cancer are associated with mutations in BRCA1/2.  We discussed  that testing can be beneficial for several reasons, including knowing about other cancer risks, identifying potential screening and risk-reduction options that may be appropriate, and to understand if other family members could be at risk for cancer and allow them to undergo genetic testing.  Based on Darlene Park's known personal and family history of cancer, she does not meet NCCN criteria for genetic testing.  However, we discussed that genetic testing can be considered given her history  of two primaries with no information about maternal and paternal family health history.  She knows genetic testing is available through a self-pay price if she wishes to proceed.    PLAN: Darlene Park did not wish to pursue genetic testing at today's visit.  She may be interested in the future and wants more time to think about it. We understand this decision and remain available to coordinate genetic testing at any time in the future. We, therefore, recommend Darlene Park continue to follow the cancer screening guidelines given by her primary healthcare provider.  Darlene Park's questions were answered to her satisfaction today. Our contact information was provided should additional questions or concerns arise. Thank you for the referral and allowing [place] to share in the care of your patient.   Malikye Park M. Joette Catching, [place], [facility] Genetic Counselor [email or web address] (P) [phone]  The patient was seen for a total of 10 minutes in face-to-face genetic counseling.

## 2022-08-17 ENCOUNTER — Telehealth: Payer: Self-pay | Admitting: *Deleted

## 2022-08-17 ENCOUNTER — Telehealth: Payer: Self-pay

## 2022-08-17 ENCOUNTER — Encounter: Payer: Self-pay | Admitting: *Deleted

## 2022-08-17 DIAGNOSIS — Z170 Estrogen receptor positive status [ER+]: Secondary | ICD-10-CM

## 2022-08-17 NOTE — Telephone Encounter (Signed)
Left vm regarding [facility] from 2.21.24. Contact information provided for questions or needs.

## 2022-08-17 NOTE — Telephone Encounter (Signed)
[employer] 2021-05 - Specimen Collection Study to Evaluate Biomarkers in Subjects with Cancer   Left a message to confirm interest in particpating the [employer] 2021-05 study. Will call again later this week to confirm interest.  Johny Drilling, [facility] 08/17/2022 4:15 PM

## 2022-08-27 ENCOUNTER — Encounter: Payer: Self-pay | Admitting: Internal Medicine

## 2022-08-27 NOTE — Progress Notes (Signed)
Outside notes received. Information abstracted. Notes sent to scan.  

## 2022-09-03 ENCOUNTER — Telehealth: Payer: Self-pay

## 2022-09-03 DIAGNOSIS — Z170 Estrogen receptor positive status [ER+]: Secondary | ICD-10-CM

## 2022-09-03 NOTE — Telephone Encounter (Signed)
[employer] 2021-05 - Specimen Collection Study to Evaluate Biomarkers in Subjects with Cancer   Left a message to confirm interest in particpating the [employer] 2021-05 study. Will call again later  to confirm interest.   Johny Drilling, [facility] 09/03/2022 10:47 AM

## 2022-09-10 ENCOUNTER — Telehealth: Payer: Self-pay

## 2022-09-10 DIAGNOSIS — Z170 Estrogen receptor positive status [ER+]: Secondary | ICD-10-CM

## 2022-09-10 NOTE — Telephone Encounter (Signed)
[employer] 2021-05 - Specimen Collection Study to Evaluate Biomarkers in Subjects with Cancer   Called the patient to see if they would be interested in study particpation. The patient did not answer and will be no longer contacted for this study.   Johny Drilling, [facility] 09/10/2022 1:09 PM

## 2022-10-02 ENCOUNTER — Telehealth: Payer: Self-pay | Admitting: Hematology and Oncology

## 2022-10-07 ENCOUNTER — Other Ambulatory Visit: Payer: Self-pay

## 2022-10-07 ENCOUNTER — Inpatient Hospital Stay: Payer: Medicare Other | Attending: Hematology and Oncology | Admitting: Hematology and Oncology

## 2022-10-07 VITALS — BP 135/41 | HR 100 | Temp 97.9°F | Resp 16 | Ht 61.0 in | Wt 171.4 lb

## 2022-10-07 DIAGNOSIS — Z79811 Long term (current) use of aromatase inhibitors: Secondary | ICD-10-CM | POA: Diagnosis not present

## 2022-10-07 DIAGNOSIS — Z85038 Personal history of other malignant neoplasm of large intestine: Secondary | ICD-10-CM | POA: Insufficient documentation

## 2022-10-07 DIAGNOSIS — Z170 Estrogen receptor positive status [ER+]: Secondary | ICD-10-CM | POA: Insufficient documentation

## 2022-10-07 DIAGNOSIS — Z90710 Acquired absence of both cervix and uterus: Secondary | ICD-10-CM | POA: Insufficient documentation

## 2022-10-07 DIAGNOSIS — C50112 Malignant neoplasm of central portion of left female breast: Secondary | ICD-10-CM | POA: Diagnosis not present

## 2022-10-07 NOTE — Assessment & Plan Note (Signed)
This is a very pleasant 77 year old postmenopausal female patient with newly diagnosed left breast grade 2 IDC, ER/PR strongly positive HER2 negative, low proliferation index referred to breast MDC for additional recommendations.  On review of imaging and physical exam, there appears to be a large mass measuring almost 7 to 8 cm occupying the entire central portion of the breast. She wanted to try neoadjuvant anti estrogen therapy since she was hoping to do a smaller surgery. She is tolerating anastrozole very well overall. No major AE's reported On exam, breast mass is softer, no discharge noted today, no substantial change in size. Mammo and [place] scheduled for end of May which is 3 months into antiestrogen therapy. She will RTC in 2 months We will try this for about 6 months, she understands she may have to proceed with surgery at around that time frame. She is agreeable.  Rachel Moulds MD

## 2022-10-07 NOTE — Progress Notes (Signed)
[facility] CONSULT NOTE  Patient Care Team: Pincus Sanes, MD as PCP - General (Internal Medicine) Louis Meckel, MD (Inactive) as Consulting Physician (Gastroenterology) Emelia Loron, MD as Consulting Physician (General Surgery) Rachel Moulds, MD as Consulting Physician (Hematology and Oncology) Antony Blackbird, MD as Consulting Physician (Radiation Oncology) Donnelly Angelica, RN as Oncology Nurse Navigator Pershing Proud, RN as Oncology Nurse Navigator  CHIEF COMPLAINTS/PURPOSE OF CONSULTATION:  Newly diagnosed breast cancer  HISTORY OF PRESENTING ILLNESS:  Darlene Park 77 y.o. female is here because of recent diagnosis of left IDC  I reviewed her records extensively and collaborated the history with the patient.  SUMMARY OF ONCOLOGIC HISTORY: Oncology History  Malignant neoplasm of central portion of left breast in female, estrogen receptor positive  07/29/2022 Mammogram   Bilateral diagnostic mammogram showed 7.7 x 5.8 x 6.8 cm oval high density mass in the left breast suspicious of malignancy.  Ultrasound-guided biopsy was recommended   08/05/2022 Pathology Results   Left breast needle core biopsy showed grade 2 IDC, ER positive strong staining 95%, PR 100% pos strong staining intensity, Ki 2%, Her 2 0   08/10/2022 Initial Diagnosis   Malignant neoplasm of central portion of left breast in female, estrogen receptor positive (HCC)    Patient arrived to the appointment today with her daughter Darlene Park. She tells me that she hasn't had a mammogram in almost 13 years or 14 years.   During the holidays, she has started noticing some changes in the breast but did not want to alarm her family.  She then has noticed that the lump started growing bigger in size.  It still does not give her any pain or any other symptoms.  She had mammogram and ultrasound and biopsy as mentioned above which confirmed a grade 2 IDC, ER/PR strong staining, HER2 0 and KI of  2%.  She wanted to try neoadjuvant anti estrogen therapy, has been taking anastrozole as prescribed. She says she has no adverse effects. She says the breast mass is softer.  Rest of the pertinent 10 point ROS reviewed and negative  MEDICAL HISTORY:  Past Medical History:  Diagnosis Date   Anemia    Breast cancer (HCC)    CHICKENPOX, HX OF 11/14/2008   Qualifier: Diagnosis of  By: Charlsie Quest RMA, Lucy     GERD    PT. DENIES.03/17/19   HYPERTENSION    Malignant neoplasm of cecum ([facility]) 03/28/2009   resection; No chemo/adj tx per onc   Osteoarthritis of left knee    URINARY INCONTINENCE    VITAMIN D DEFICIENCY     SURGICAL HISTORY: Past Surgical History:  Procedure Laterality Date   ABDOMINAL HYSTERECTOMY  1976   Partial precancer cells done @ [facility] in [place]   COLON SURGERY  12/2008   COLONOSCOPY     HEMORRHOID SURGERY  1980's   POLYPECTOMY      SOCIAL HISTORY: Social History   Socioeconomic History   Marital status: Divorced    Spouse name: Not on file   Number of children: Not on file   Years of education: Not on file   Highest education level: Not on file  Occupational History   Not on file  Tobacco Use   Smoking status: Never   Smokeless tobacco: Never  Vaping Use   Vaping Use: Never used  Substance and Sexual Activity   Alcohol use: Yes    Alcohol/week: 0.0 standard drinks of alcohol  Comment: socially   Drug use: No   Sexual activity: Not on file  Other Topics Concern   Not on file  Social History Narrative   Retired from [employer] with daughter at her home   Social Determinants of Health   Financial Resource Strain: Low Risk  (07/31/2022)   Overall Financial Resource Strain (CARDIA)    Difficulty of Paying Living Expenses: Not hard at all  Food Insecurity: No Food Insecurity (07/31/2022)   Hunger Vital Sign    Worried About Running Out of Food in the Last Year: Never true    Ran Out of Food in the Last Year: Never  true  Transportation Needs: No Transportation Needs (07/31/2022)   PRAPARE - Administrator, Civil Service (Medical): No    Lack of Transportation (Non-Medical): No  Physical Activity: Sufficiently Active (07/31/2022)   Exercise Vital Sign    Days of Exercise per Week: 5 days    Minutes of Exercise per Session: 30 min  Stress: No Stress Concern Present (07/31/2022)   [employer] of Occupational Health - Occupational Stress Questionnaire    Feeling of Stress : Not at all  Social Connections: Moderately Integrated (07/31/2022)   Social Connection and Isolation Panel [NHANES]    Frequency of Communication with Friends and Family: More than three times a week    Frequency of Social Gatherings with Friends and Family: More than three times a week    Attends Religious Services: More than 4 times per year    Active Member of [employer] or Organizations: No    Attends Engineer, structural: More than 4 times per year    Marital Status: Widowed  Intimate Partner Violence: Not At Risk (07/31/2022)   Humiliation, Afraid, Rape, and Kick questionnaire    Fear of Current or Ex-Partner: No    Emotionally Abused: No    Physically Abused: No    Sexually Abused: No    FAMILY HISTORY: Family History  Adopted: Yes  Problem Relation Age of Onset   Hypertension Other    Heart disease Other    Prostate cancer Other    Colon cancer Neg Hx    Esophageal cancer Neg Hx    Rectal cancer Neg Hx    Stomach cancer Neg Hx    Colon polyps Neg Hx     ALLERGIES:  is allergic to penicillins and crestor [rosuvastatin].  MEDICATIONS:  Current Outpatient Medications  Medication Sig Dispense Refill   Acetaminophen (TYLENOL ARTHRITIS PAIN PO) Take 1 tablet by mouth.     amLODipine (NORVASC) 5 MG tablet TAKE 1 TABLET (5 MG TOTAL) BY MOUTH DAILY. 90 tablet 3   anastrozole (ARIMIDEX) 1 MG tablet Take 1 tablet (1 mg total) by mouth daily. 90 tablet 3   BENICAR 20 MG tablet TAKE 1 TABLET BY MOUTH EVERY  DAY 90 tablet 3   Cholecalciferol 1000 UNIT capsule Take 1,000 Units by mouth daily.     diclofenac sodium (VOLTAREN) 1 % GEL Apply 2 g topically 3 (three) times daily as needed. 100 g 1   pravastatin (PRAVACHOL) 10 MG tablet TAKE 1 TABLET BY MOUTH EVERY DAY 90 tablet 1   No current facility-administered medications for this visit.    REVIEW OF SYSTEMS:   Constitutional: Denies fevers, chills or abnormal night sweats Eyes: Denies blurriness of vision, double vision or watery eyes Ears, nose, mouth, throat, and face: Denies mucositis or sore throat Respiratory: Denies cough, dyspnea or wheezes  Cardiovascular: Denies palpitation, chest discomfort or lower extremity swelling Gastrointestinal:  Denies nausea, heartburn or change in bowel habits Skin: Denies abnormal skin rashes Lymphatics: Denies new lymphadenopathy or easy bruising Neurological:Denies numbness, tingling or new weaknesses Behavioral/Psych: Mood is stable, no new changes  Breast: As mentioned above All other systems were reviewed with the patient and are negative.  PHYSICAL EXAMINATION: ECOG PERFORMANCE STATUS: 0 - Asymptomatic  Vitals:   10/07/22 1302  BP: (!) 135/41  Pulse: 100  Resp: 16  Temp: 97.9 F (36.6 C)  SpO2: 99%    Filed Weights   10/07/22 1302  Weight: 171 lb 6.4 oz (77.7 kg)     Physical Exam Constitutional:      Appearance: Normal appearance.  Chest:    Neurological:     Mental Status: She is alert.     Breast mass is softer, size appears the same.  LABORATORY DATA:  I have reviewed the data as listed Lab Results  Component Value Date   WBC 6.1 08/12/2022   HGB 12.2 08/12/2022   HCT 35.5 (L) 08/12/2022   MCV 87.0 08/12/2022   PLT 334 08/12/2022   Lab Results  Component Value Date   NA 138 08/12/2022   K 3.8 08/12/2022   CL 103 08/12/2022   CO2 27 08/12/2022    RADIOGRAPHIC STUDIES: I have personally reviewed the radiological reports and agreed with the findings in the  report.  ASSESSMENT AND PLAN:  Malignant neoplasm of central portion of left breast in female, estrogen receptor positive (HCC) This is a very pleasant 77 year old postmenopausal female patient with newly diagnosed left breast grade 2 IDC, ER/PR strongly positive HER2 negative, low proliferation index referred to breast MDC for additional recommendations.  On review of imaging and physical exam, there appears to be a large mass measuring almost 7 to 8 cm occupying the entire central portion of the breast. She wanted to try neoadjuvant anti estrogen therapy since she was hoping to do a smaller surgery. She is tolerating anastrozole very well overall. No major AE's reported On exam, breast mass is softer, no discharge noted today, no substantial change in size. Mammo and [place] scheduled for end of May which is 3 months into antiestrogen therapy. She will RTC in 2 months We will try this for about 6 months, she understands she may have to proceed with surgery at around that time frame. She is agreeable.  Rachel Moulds MD    All questions were answered. The patient knows to call the clinic with any problems, questions or concerns.    Rachel Moulds, MD 10/07/22

## 2022-11-09 ENCOUNTER — Encounter: Payer: Self-pay | Admitting: *Deleted

## 2022-11-10 ENCOUNTER — Ambulatory Visit
Admission: RE | Admit: 2022-11-10 | Discharge: 2022-11-10 | Disposition: A | Discharge status: Home / Self Care | Payer: Medicare Other | Source: Ambulatory Visit | Attending: Hematology and Oncology | Admitting: Hematology and Oncology

## 2022-11-10 DIAGNOSIS — R921 Mammographic calcification found on diagnostic imaging of breast: Secondary | ICD-10-CM | POA: Diagnosis not present

## 2022-11-10 DIAGNOSIS — N63 Unspecified lump in unspecified breast: Secondary | ICD-10-CM | POA: Diagnosis not present

## 2022-11-10 DIAGNOSIS — Z170 Estrogen receptor positive status [ER+]: Secondary | ICD-10-CM

## 2022-11-10 DIAGNOSIS — Z853 Personal history of malignant neoplasm of breast: Secondary | ICD-10-CM | POA: Diagnosis not present

## 2022-11-10 DIAGNOSIS — R92322 Mammographic fibroglandular density, left breast: Secondary | ICD-10-CM | POA: Diagnosis not present

## 2022-11-11 ENCOUNTER — Encounter: Payer: Self-pay | Admitting: *Deleted

## 2022-11-13 ENCOUNTER — Encounter: Payer: Self-pay | Admitting: *Deleted

## 2022-11-18 DIAGNOSIS — Z170 Estrogen receptor positive status [ER+]: Secondary | ICD-10-CM | POA: Diagnosis not present

## 2022-11-18 DIAGNOSIS — C50112 Malignant neoplasm of central portion of left female breast: Secondary | ICD-10-CM | POA: Diagnosis not present

## 2022-11-19 ENCOUNTER — Encounter: Payer: Self-pay | Admitting: *Deleted

## 2022-11-26 ENCOUNTER — Telehealth: Payer: Self-pay | Admitting: Hematology and Oncology

## 2022-11-26 NOTE — Telephone Encounter (Signed)
Spoke with patient confirming upcoming appointment  

## 2022-12-02 ENCOUNTER — Ambulatory Visit: Payer: Medicare Other | Admitting: Hematology and Oncology

## 2022-12-03 ENCOUNTER — Encounter: Payer: Self-pay | Admitting: *Deleted

## 2022-12-08 ENCOUNTER — Other Ambulatory Visit: Payer: Self-pay

## 2022-12-08 ENCOUNTER — Inpatient Hospital Stay: Payer: Medicare Other | Attending: Hematology and Oncology | Admitting: Hematology and Oncology

## 2022-12-08 VITALS — BP 149/98 | HR 90 | Temp 97.7°F | Resp 16 | Wt 168.6 lb

## 2022-12-08 DIAGNOSIS — C50112 Malignant neoplasm of central portion of left female breast: Secondary | ICD-10-CM | POA: Insufficient documentation

## 2022-12-08 DIAGNOSIS — Z8042 Family history of malignant neoplasm of prostate: Secondary | ICD-10-CM | POA: Insufficient documentation

## 2022-12-08 DIAGNOSIS — Z79811 Long term (current) use of aromatase inhibitors: Secondary | ICD-10-CM | POA: Insufficient documentation

## 2022-12-08 DIAGNOSIS — Z90710 Acquired absence of both cervix and uterus: Secondary | ICD-10-CM | POA: Diagnosis not present

## 2022-12-08 DIAGNOSIS — Z170 Estrogen receptor positive status [ER+]: Secondary | ICD-10-CM | POA: Insufficient documentation

## 2022-12-08 NOTE — Progress Notes (Signed)
[facility] CONSULT NOTE  Patient Care Team: Pincus Sanes, MD as PCP - General (Internal Medicine) Louis Meckel, MD (Inactive) as Consulting Physician (Gastroenterology) Emelia Loron, MD as Consulting Physician (General Surgery) Rachel Moulds, MD as Consulting Physician (Hematology and Oncology) Antony Blackbird, MD as Consulting Physician (Radiation Oncology) Donnelly Angelica, RN as Oncology Nurse Navigator Pershing Proud, RN as Oncology Nurse Navigator  CHIEF COMPLAINTS/PURPOSE OF CONSULTATION:  Newly diagnosed breast cancer  HISTORY OF PRESENTING ILLNESS:  Darlene Park 77 y.o. female is here because of recent diagnosis of left IDC  I reviewed her records extensively and collaborated the history with the patient.  SUMMARY OF ONCOLOGIC HISTORY: Oncology History  Malignant neoplasm of central portion of left breast in female, estrogen receptor positive (HCC)  07/29/2022 Mammogram   Bilateral diagnostic mammogram showed 7.7 x 5.8 x 6.8 cm oval high density mass in the left breast suspicious of malignancy.  Ultrasound-guided biopsy was recommended   08/05/2022 Pathology Results   Left breast needle core biopsy showed grade 2 IDC, ER positive strong staining 95%, PR 100% pos strong staining intensity, Ki 2%, Her 2 0   08/10/2022 Initial Diagnosis   Malignant neoplasm of central portion of left breast in female, estrogen receptor positive (HCC)    Patient arrived to the appointment today with her daughter Darlene Park. She tells me that she hasn't had a mammogram in almost 13 years or 14 years.   During the holidays, she has started noticing some changes in the breast but did not want to alarm her family.  She then has noticed that the lump started growing bigger in size.  It still does not give her any pain or any other symptoms.  She had mammogram and ultrasound and biopsy as mentioned above which confirmed a grade 2 IDC, ER/PR strong staining, HER2 0 and KI  of 2%.  She wanted to try neoadjuvant anti estrogen therapy, has been taking anastrozole as prescribed. She says she has no adverse effects. She says the breast mass is softer. She also had a mammogram and [place] and she is here to discuss results. She says the breast has smaller, it feels softer, and she still wonders if she can do a small surgery.  She and her daughter had a few good questions about the possibility of lumpectomy here.  She has a follow-up with Dr. Dwain Sarna scheduled in August.  She is very compliant with the medication otherwise and denies any major side effects. Rest of the pertinent 10 point ROS reviewed and negative  MEDICAL HISTORY:  Past Medical History:  Diagnosis Date   Anemia    Breast cancer (HCC)    CHICKENPOX, HX OF 11/14/2008   Qualifier: Diagnosis of  By: Charlsie Quest RMA, Lucy     GERD    PT. DENIES.03/17/19   HYPERTENSION    Malignant neoplasm of cecum (HCC) 03/28/2009   resection; No chemo/adj tx per onc   Osteoarthritis of left knee    URINARY INCONTINENCE    VITAMIN D DEFICIENCY     SURGICAL HISTORY: Past Surgical History:  Procedure Laterality Date   ABDOMINAL HYSTERECTOMY  1976   Partial precancer cells done @ [facility] in [place]   COLON SURGERY  12/2008   COLONOSCOPY     HEMORRHOID SURGERY  1980's   POLYPECTOMY      SOCIAL HISTORY: Social History   Socioeconomic History   Marital status: Divorced    Spouse name: Not on file  Number of children: Not on file   Years of education: Not on file   Highest education level: Not on file  Occupational History   Not on file  Tobacco Use   Smoking status: Never   Smokeless tobacco: Never  Vaping Use   Vaping Use: Never used  Substance and Sexual Activity   Alcohol use: Yes    Alcohol/week: 0.0 standard drinks of alcohol    Comment: socially   Drug use: No   Sexual activity: Not on file  Other Topics Concern   Not on file  Social History Narrative   Retired from [employer] with daughter at her home   Social Determinants of Health   Financial Resource Strain: Low Risk  (07/31/2022)   Overall Financial Resource Strain (CARDIA)    Difficulty of Paying Living Expenses: Not hard at all  Food Insecurity: No Food Insecurity (07/31/2022)   Hunger Vital Sign    Worried About Running Out of Food in the Last Year: Never true    Ran Out of Food in the Last Year: Never true  Transportation Needs: No Transportation Needs (07/31/2022)   PRAPARE - Administrator, Civil Service (Medical): No    Lack of Transportation (Non-Medical): No  Physical Activity: Sufficiently Active (07/31/2022)   Exercise Vital Sign    Days of Exercise per Week: 5 days    Minutes of Exercise per Session: 30 min  Stress: No Stress Concern Present (07/31/2022)   [employer] of Occupational Health - Occupational Stress Questionnaire    Feeling of Stress : Not at all  Social Connections: Moderately Integrated (07/31/2022)   Social Connection and Isolation Panel [NHANES]    Frequency of Communication with Friends and Family: More than three times a week    Frequency of Social Gatherings with Friends and Family: More than three times a week    Attends Religious Services: More than 4 times per year    Active Member of [employer] or Organizations: No    Attends Engineer, structural: More than 4 times per year    Marital Status: Widowed  Intimate Partner Violence: Not At Risk (07/31/2022)   Humiliation, Afraid, Rape, and Kick questionnaire    Fear of Current or Ex-Partner: No    Emotionally Abused: No    Physically Abused: No    Sexually Abused: No    FAMILY HISTORY: Family History  Adopted: Yes  Problem Relation Age of Onset   Hypertension Other    Heart disease Other    Prostate cancer Other    Colon cancer Neg Hx    Esophageal cancer Neg Hx    Rectal cancer Neg Hx    Stomach cancer Neg Hx    Colon polyps Neg Hx     ALLERGIES:  is allergic to penicillins and  crestor [rosuvastatin].  MEDICATIONS:  Current Outpatient Medications  Medication Sig Dispense Refill   Acetaminophen (TYLENOL ARTHRITIS PAIN PO) Take 1 tablet by mouth.     amLODipine (NORVASC) 5 MG tablet TAKE 1 TABLET (5 MG TOTAL) BY MOUTH DAILY. 90 tablet 3   anastrozole (ARIMIDEX) 1 MG tablet Take 1 tablet (1 mg total) by mouth daily. 90 tablet 3   BENICAR 20 MG tablet TAKE 1 TABLET BY MOUTH EVERY DAY 90 tablet 3   Cholecalciferol 1000 UNIT capsule Take 1,000 Units by mouth daily.     diclofenac sodium (VOLTAREN) 1 % GEL Apply 2 g topically 3 (three) times daily  as needed. 100 g 1   pravastatin (PRAVACHOL) 10 MG tablet TAKE 1 TABLET BY MOUTH EVERY DAY 90 tablet 1   No current facility-administered medications for this visit.    REVIEW OF SYSTEMS:   Constitutional: Denies fevers, chills or abnormal night sweats Eyes: Denies blurriness of vision, double vision or watery eyes Ears, nose, mouth, throat, and face: Denies mucositis or sore throat Respiratory: Denies cough, dyspnea or wheezes Cardiovascular: Denies palpitation, chest discomfort or lower extremity swelling Gastrointestinal:  Denies nausea, heartburn or change in bowel habits Skin: Denies abnormal skin rashes Lymphatics: Denies new lymphadenopathy or easy bruising Neurological:Denies numbness, tingling or new weaknesses Behavioral/Psych: Mood is stable, no new changes  Breast: As mentioned above All other systems were reviewed with the patient and are negative.  PHYSICAL EXAMINATION: ECOG PERFORMANCE STATUS: 0 - Asymptomatic  Vitals:   12/08/22 1206  BP: (!) 149/98  Pulse: 90  Resp: 16  Temp: 97.7 F (36.5 C)  SpO2: 100%    Filed Weights   12/08/22 1206  Weight: 168 lb 9.6 oz (76.5 kg)     Physical Exam Constitutional:      Appearance: Normal appearance.  Chest:       Comments: Significant response in the left breast with a smaller size, mass is more mobile and soft.  No regional adenopathy  noted. Neurological:     Mental Status: She is alert.       LABORATORY DATA:  I have reviewed the data as listed Lab Results  Component Value Date   WBC 6.1 08/12/2022   HGB 12.2 08/12/2022   HCT 35.5 (L) 08/12/2022   MCV 87.0 08/12/2022   PLT 334 08/12/2022   Lab Results  Component Value Date   NA 138 08/12/2022   K 3.8 08/12/2022   CL 103 08/12/2022   CO2 27 08/12/2022    RADIOGRAPHIC STUDIES: I have personally reviewed the radiological reports and agreed with the findings in the report.  ASSESSMENT AND PLAN:  Malignant neoplasm of central portion of left breast in female, estrogen receptor positive (HCC) This is a very pleasant 77 year old postmenopausal female patient with newly diagnosed left breast grade 2 IDC, ER/PR strongly positive HER2 negative, low proliferation index referred to breast MDC for additional recommendations.  On review of imaging and physical exam, there appears to be a large mass measuring almost 7 to 8 cm occupying the entire central portion of the breast. She wanted to try neoadjuvant anti estrogen therapy since she was hoping to do a smaller surgery. She is tolerating anastrozole very well overall. No major AE's reported she is here for follow-up after her mammogram and ultrasound which has shown remarkable response.  Patient and her daughter still wonder about the possibility of lumpectomy.  This is certainly possible if she continues to have a great response with neoadjuvant antiestrogen therapy but she will meet Dr. Dwain Sarna in August to discuss this once more.  I encouraged her to continue anastrozole and follow-up with me in about 3 months or sooner as needed.  All her questions were answered to the best my knowledge.    All questions were answered. The patient knows to call the clinic with any problems, questions or concerns.    Rachel Moulds, MD 12/08/22

## 2022-12-08 NOTE — Assessment & Plan Note (Addendum)
This is a very pleasant 77 year old postmenopausal female patient with newly diagnosed left breast grade 2 IDC, ER/PR strongly positive HER2 negative, low proliferation index referred to breast MDC for additional recommendations.  On review of imaging and physical exam, there appears to be a large mass measuring almost 7 to 8 cm occupying the entire central portion of the breast. She wanted to try neoadjuvant anti estrogen therapy since she was hoping to do a smaller surgery. She is tolerating anastrozole very well overall. No major AE's reported she is here for follow-up after her mammogram and ultrasound which has shown remarkable response.  Patient and her daughter still wonder about the possibility of lumpectomy.  This is certainly possible if she continues to have a great response with neoadjuvant antiestrogen therapy but she will meet Dr. Dwain Sarna in August to discuss this once more.  I encouraged her to continue anastrozole and follow-up with me in about 3 months or sooner as needed.  All her questions were answered to the best my knowledge.

## 2022-12-10 ENCOUNTER — Encounter: Payer: Self-pay | Admitting: *Deleted

## 2022-12-13 ENCOUNTER — Other Ambulatory Visit: Payer: Self-pay | Admitting: Internal Medicine

## 2023-01-22 ENCOUNTER — Other Ambulatory Visit: Payer: Self-pay | Admitting: *Deleted

## 2023-01-22 DIAGNOSIS — Z170 Estrogen receptor positive status [ER+]: Secondary | ICD-10-CM

## 2023-01-25 ENCOUNTER — Encounter: Payer: Self-pay | Admitting: *Deleted

## 2023-02-11 ENCOUNTER — Ambulatory Visit
Admission: RE | Admit: 2023-02-11 | Discharge: 2023-02-11 | Disposition: A | Discharge status: Home / Self Care | Payer: Medicare Other | Source: Ambulatory Visit | Attending: Hematology and Oncology | Admitting: Hematology and Oncology

## 2023-02-11 DIAGNOSIS — Z170 Estrogen receptor positive status [ER+]: Secondary | ICD-10-CM

## 2023-02-11 DIAGNOSIS — R921 Mammographic calcification found on diagnostic imaging of breast: Secondary | ICD-10-CM | POA: Diagnosis not present

## 2023-02-11 DIAGNOSIS — C50412 Malignant neoplasm of upper-outer quadrant of left female breast: Secondary | ICD-10-CM | POA: Diagnosis not present

## 2023-02-12 ENCOUNTER — Encounter: Payer: Self-pay | Admitting: *Deleted

## 2023-02-18 DIAGNOSIS — Z170 Estrogen receptor positive status [ER+]: Secondary | ICD-10-CM | POA: Diagnosis not present

## 2023-02-18 DIAGNOSIS — C50112 Malignant neoplasm of central portion of left female breast: Secondary | ICD-10-CM | POA: Diagnosis not present

## 2023-02-26 ENCOUNTER — Encounter: Payer: Self-pay | Admitting: *Deleted

## 2023-03-10 ENCOUNTER — Other Ambulatory Visit: Payer: Self-pay | Admitting: Internal Medicine

## 2023-03-10 ENCOUNTER — Encounter: Payer: Self-pay | Admitting: Hematology and Oncology

## 2023-03-10 ENCOUNTER — Inpatient Hospital Stay: Payer: Medicare Other | Attending: Hematology and Oncology | Admitting: Hematology and Oncology

## 2023-03-10 VITALS — BP 124/58 | HR 97 | Temp 97.9°F | Resp 18 | Ht 61.0 in | Wt 169.6 lb

## 2023-03-10 DIAGNOSIS — Z8042 Family history of malignant neoplasm of prostate: Secondary | ICD-10-CM | POA: Diagnosis not present

## 2023-03-10 DIAGNOSIS — Z170 Estrogen receptor positive status [ER+]: Secondary | ICD-10-CM | POA: Insufficient documentation

## 2023-03-10 DIAGNOSIS — C50112 Malignant neoplasm of central portion of left female breast: Secondary | ICD-10-CM | POA: Diagnosis not present

## 2023-03-10 DIAGNOSIS — Z90710 Acquired absence of both cervix and uterus: Secondary | ICD-10-CM | POA: Diagnosis not present

## 2023-03-10 DIAGNOSIS — I10 Essential (primary) hypertension: Secondary | ICD-10-CM

## 2023-03-10 DIAGNOSIS — Z79811 Long term (current) use of aromatase inhibitors: Secondary | ICD-10-CM | POA: Diagnosis not present

## 2023-03-10 NOTE — Assessment & Plan Note (Signed)
This is a very pleasant 77 year old postmenopausal female patient with newly diagnosed left breast grade 2 IDC, ER/PR strongly positive HER2 negative, low proliferation index referred to breast MDC for additional recommendations.  On review of imaging and physical exam, there appears to be a large mass measuring almost 7 to 8 cm occupying the entire central portion of the breast. She wanted to try neoadjuvant anti estrogen therapy since she was hoping to do a smaller surgery.    Breast Cancer Tumor has decreased in size with Anastrozole treatment, but calcifications remain. Discussed the possibility of needing a mastectomy due to the extent of calcifications. Patient wishes to continue Anastrozole for three more months before reassessing. -Continue Anastrozole. -Order mammogram and ultrasound in November. -Follow-up appointment in mid-December to discuss imaging results and potential surgical intervention.  Post-Surgical Treatment Discussed potential need for chemotherapy or radiation post-surgery. Given the tumor's responsiveness to antiestrogens, it may be possible to continue with Anastrozole alone. -Decision on post-surgical treatment to be made after surgery, if necessary.

## 2023-03-10 NOTE — Progress Notes (Signed)
[facility] CONSULT NOTE  Patient Care Team: Pincus Sanes, MD as PCP - General (Internal Medicine) Louis Meckel, MD (Inactive) as Consulting Physician (Gastroenterology) Emelia Loron, MD as Consulting Physician (General Surgery) Rachel Moulds, MD as Consulting Physician (Hematology and Oncology) Antony Blackbird, MD as Consulting Physician (Radiation Oncology) Donnelly Angelica, RN as Oncology Nurse Navigator Pershing Proud, RN as Oncology Nurse Navigator  CHIEF COMPLAINTS/PURPOSE OF CONSULTATION:  Newly diagnosed breast cancer  HISTORY OF PRESENTING ILLNESS:  Darlene Park 77 y.o. female is here because of recent diagnosis of left IDC  I reviewed her records extensively and collaborated the history with the patient.  SUMMARY OF ONCOLOGIC HISTORY: Oncology History  Malignant neoplasm of central portion of left breast in female, estrogen receptor positive (HCC)  07/29/2022 Mammogram   Bilateral diagnostic mammogram showed 7.7 x 5.8 x 6.8 cm oval high density mass in the left breast suspicious of malignancy.  Ultrasound-guided biopsy was recommended   08/05/2022 Pathology Results   Left breast needle core biopsy showed grade 2 IDC, ER positive strong staining 95%, PR 100% pos strong staining intensity, Ki 2%, Her 2 0   08/10/2022 Initial Diagnosis   Malignant neoplasm of central portion of left breast in female, estrogen receptor positive (HCC)    Patient arrived to the appointment today with her daughter Darlene Park. She tells me that she hasn't had a mammogram in almost 13 years or 14 years.   During the holidays, she has started noticing some changes in the breast but did not want to alarm her family.  She then has noticed that the lump started growing bigger in size.  It still does not give her any pain or any other symptoms.  She had mammogram and ultrasound and biopsy as mentioned above which confirmed a grade 2 IDC, ER/PR strong staining, HER2 0 and KI  of 2%.  She wanted to try neoadjuvant anti estrogen therapy, has been taking anastrozole as prescribed.   Discussed the use of AI scribe software for clinical note transcription with the patient, who gave verbal consent to proceed.  History of Present Illness   The patient, with a history of breast cancer, has been on anastrozole, an antiestrogen medication. The treatment has resulted in a decrease in the size of the tumor, which was confirmed during the physical examination. The patient and doctor have agreed to continue the current treatment for three more months, after which another mammogram and ultrasound will be performed to assess the situation. The patient is managing well with her other medications and has no severe side effects. She is optimistic and is trying to maintain a positive outlook on life.     Rest of the pertinent 10 point ROS reviewed and negative  MEDICAL HISTORY:  Past Medical History:  Diagnosis Date   Anemia    Breast cancer (HCC)    CHICKENPOX, HX OF 11/14/2008   Qualifier: Diagnosis of  By: Charlsie Quest RMA, Lucy     GERD    PT. DENIES.03/17/19   HYPERTENSION    Malignant neoplasm of cecum (HCC) 03/28/2009   resection; No chemo/adj tx per onc   Osteoarthritis of left knee    URINARY INCONTINENCE    VITAMIN D DEFICIENCY     SURGICAL HISTORY: Past Surgical History:  Procedure Laterality Date   ABDOMINAL HYSTERECTOMY  1976   Partial precancer cells done @ [facility] in [place]   COLON SURGERY  12/2008   COLONOSCOPY  HEMORRHOID SURGERY  1980's   POLYPECTOMY      SOCIAL HISTORY: Social History   Socioeconomic History   Marital status: Divorced    Spouse name: Not on file   Number of children: Not on file   Years of education: Not on file   Highest education level: Not on file  Occupational History   Not on file  Tobacco Use   Smoking status: Never   Smokeless tobacco: Never  Vaping Use   Vaping status: Never Used  Substance and Sexual  Activity   Alcohol use: Yes    Alcohol/week: 0.0 standard drinks of alcohol    Comment: socially   Drug use: No   Sexual activity: Not on file  Other Topics Concern   Not on file  Social History Narrative   Retired from [employer] with daughter at her home   Social Determinants of Health   Financial Resource Strain: Low Risk  (07/31/2022)   Overall Financial Resource Strain (CARDIA)    Difficulty of Paying Living Expenses: Not hard at all  Food Insecurity: No Food Insecurity (07/31/2022)   Hunger Vital Sign    Worried About Running Out of Food in the Last Year: Never true    Ran Out of Food in the Last Year: Never true  Transportation Needs: No Transportation Needs (07/31/2022)   PRAPARE - Administrator, Civil Service (Medical): No    Lack of Transportation (Non-Medical): No  Physical Activity: Sufficiently Active (07/31/2022)   Exercise Vital Sign    Days of Exercise per Week: 5 days    Minutes of Exercise per Session: 30 min  Stress: No Stress Concern Present (07/31/2022)   [employer] of Occupational Health - Occupational Stress Questionnaire    Feeling of Stress : Not at all  Social Connections: Moderately Integrated (07/31/2022)   Social Connection and Isolation Panel [NHANES]    Frequency of Communication with Friends and Family: More than three times a week    Frequency of Social Gatherings with Friends and Family: More than three times a week    Attends Religious Services: More than 4 times per year    Active Member of [employer] or Organizations: No    Attends Engineer, structural: More than 4 times per year    Marital Status: Widowed  Intimate Partner Violence: Not At Risk (07/31/2022)   Humiliation, Afraid, Rape, and Kick questionnaire    Fear of Current or Ex-Partner: No    Emotionally Abused: No    Physically Abused: No    Sexually Abused: No    FAMILY HISTORY: Family History  Adopted: Yes  Problem Relation Age of Onset    Hypertension Other    Heart disease Other    Prostate cancer Other    Colon cancer Neg Hx    Esophageal cancer Neg Hx    Rectal cancer Neg Hx    Stomach cancer Neg Hx    Colon polyps Neg Hx     ALLERGIES:  is allergic to penicillins and crestor [rosuvastatin].  MEDICATIONS:  Current Outpatient Medications  Medication Sig Dispense Refill   Acetaminophen (TYLENOL ARTHRITIS PAIN PO) Take 1 tablet by mouth.     amLODipine (NORVASC) 5 MG tablet TAKE 1 TABLET (5 MG TOTAL) BY MOUTH DAILY. 90 tablet 3   anastrozole (ARIMIDEX) 1 MG tablet Take 1 tablet (1 mg total) by mouth daily. 90 tablet 3   BENICAR 20 MG tablet TAKE 1 TABLET BY MOUTH  EVERY DAY 90 tablet 3   Cholecalciferol 1000 UNIT capsule Take 1,000 Units by mouth daily.     diclofenac sodium (VOLTAREN) 1 % GEL Apply 2 g topically 3 (three) times daily as needed. 100 g 1   pravastatin (PRAVACHOL) 10 MG tablet TAKE 1 TABLET BY MOUTH EVERY DAY 90 tablet 1   No current facility-administered medications for this visit.    REVIEW OF SYSTEMS:   Constitutional: Denies fevers, chills or abnormal night sweats Eyes: Denies blurriness of vision, double vision or watery eyes Ears, nose, mouth, throat, and face: Denies mucositis or sore throat Respiratory: Denies cough, dyspnea or wheezes Cardiovascular: Denies palpitation, chest discomfort or lower extremity swelling Gastrointestinal:  Denies nausea, heartburn or change in bowel habits Skin: Denies abnormal skin rashes Lymphatics: Denies new lymphadenopathy or easy bruising Neurological:Denies numbness, tingling or new weaknesses Behavioral/Psych: Mood is stable, no new changes  Breast: As mentioned above All other systems were reviewed with the patient and are negative.  PHYSICAL EXAMINATION: ECOG PERFORMANCE STATUS: 0 - Asymptomatic  Vitals:   03/10/23 1317  BP: (!) 124/58  Pulse: 97  Resp: 18  Temp: 97.9 F (36.6 C)  SpO2: 100%    Filed Weights   03/10/23 1317  Weight: 169  lb 9.6 oz (76.9 kg)   General: Alert, oriented and in no acute distress Breasts: Bilateral breast examined and palpated.  Palpable left breast mass which is significantly smaller and now measures approximately 3 cm.  No other concerns today    LABORATORY DATA:  I have reviewed the data as listed Lab Results  Component Value Date   WBC 6.1 08/12/2022   HGB 12.2 08/12/2022   HCT 35.5 (L) 08/12/2022   MCV 87.0 08/12/2022   PLT 334 08/12/2022   Lab Results  Component Value Date   NA 138 08/12/2022   K 3.8 08/12/2022   CL 103 08/12/2022   CO2 27 08/12/2022    RADIOGRAPHIC STUDIES: I have personally reviewed the radiological reports and agreed with the findings in the report.  ASSESSMENT AND PLAN:  Malignant neoplasm of central portion of left breast in female, estrogen receptor positive (HCC) This is a very pleasant 77 year old postmenopausal female patient with newly diagnosed left breast grade 2 IDC, ER/PR strongly positive HER2 negative, low proliferation index referred to breast MDC for additional recommendations.  On review of imaging and physical exam, there appears to be a large mass measuring almost 7 to 8 cm occupying the entire central portion of the breast. She wanted to try neoadjuvant anti estrogen therapy since she was hoping to do a smaller surgery.    Breast Cancer Tumor has decreased in size with Anastrozole treatment, but calcifications remain. Discussed the possibility of needing a mastectomy due to the extent of calcifications. Patient wishes to continue Anastrozole for three more months before reassessing. -Continue Anastrozole. -Order mammogram and ultrasound in November. -Follow-up appointment in mid-December to discuss imaging results and potential surgical intervention.  Post-Surgical Treatment Discussed potential need for chemotherapy or radiation post-surgery. Given the tumor's responsiveness to antiestrogens, it may be possible to continue with  Anastrozole alone. -Decision on post-surgical treatment to be made after surgery, if necessary.          All questions were answered. The patient knows to call the clinic with any problems, questions or concerns.    Rachel Moulds, MD 03/10/23

## 2023-03-11 ENCOUNTER — Encounter: Payer: Self-pay | Admitting: *Deleted

## 2023-04-13 ENCOUNTER — Encounter: Payer: Self-pay | Admitting: *Deleted

## 2023-04-28 ENCOUNTER — Telehealth: Payer: Self-pay | Admitting: Hematology and Oncology

## 2023-04-28 NOTE — Telephone Encounter (Signed)
Left patient a message in regards to rescheduled appointment due to Dr. Al Pimple being out of office on 06/03/2023

## 2023-05-07 ENCOUNTER — Telehealth: Payer: Self-pay

## 2023-05-17 ENCOUNTER — Ambulatory Visit
Admission: RE | Admit: 2023-05-17 | Discharge: 2023-05-17 | Disposition: A | Discharge status: Home / Self Care | Payer: Medicare Other | Source: Ambulatory Visit | Attending: Hematology and Oncology | Admitting: Hematology and Oncology

## 2023-05-17 DIAGNOSIS — C50412 Malignant neoplasm of upper-outer quadrant of left female breast: Secondary | ICD-10-CM | POA: Diagnosis not present

## 2023-05-17 DIAGNOSIS — R921 Mammographic calcification found on diagnostic imaging of breast: Secondary | ICD-10-CM | POA: Diagnosis not present

## 2023-05-17 DIAGNOSIS — C50112 Malignant neoplasm of central portion of left female breast: Secondary | ICD-10-CM

## 2023-05-18 ENCOUNTER — Encounter: Payer: Self-pay | Admitting: *Deleted

## 2023-06-03 ENCOUNTER — Ambulatory Visit: Payer: Medicare Other | Admitting: Hematology and Oncology

## 2023-06-03 NOTE — Telephone Encounter (Signed)
Error

## 2023-06-07 ENCOUNTER — Encounter: Payer: Self-pay | Admitting: *Deleted

## 2023-06-07 ENCOUNTER — Inpatient Hospital Stay: Payer: Medicare Other | Attending: Hematology and Oncology | Admitting: Hematology and Oncology

## 2023-06-07 VITALS — BP 132/66 | HR 102 | Temp 97.7°F | Resp 18 | Wt 167.9 lb

## 2023-06-07 DIAGNOSIS — C50112 Malignant neoplasm of central portion of left female breast: Secondary | ICD-10-CM | POA: Diagnosis not present

## 2023-06-07 DIAGNOSIS — Z1721 Progesterone receptor positive status: Secondary | ICD-10-CM | POA: Diagnosis not present

## 2023-06-07 DIAGNOSIS — Z79811 Long term (current) use of aromatase inhibitors: Secondary | ICD-10-CM | POA: Diagnosis not present

## 2023-06-07 DIAGNOSIS — Z8042 Family history of malignant neoplasm of prostate: Secondary | ICD-10-CM | POA: Diagnosis not present

## 2023-06-07 DIAGNOSIS — Z170 Estrogen receptor positive status [ER+]: Secondary | ICD-10-CM | POA: Diagnosis not present

## 2023-06-07 NOTE — Progress Notes (Signed)
[facility] CONSULT NOTE  Patient Care Team: Pincus Sanes, MD as PCP - General (Internal Medicine) Louis Meckel, MD (Inactive) as Consulting Physician (Gastroenterology) Emelia Loron, MD as Consulting Physician (General Surgery) Rachel Moulds, MD as Consulting Physician (Hematology and Oncology) Antony Blackbird, MD as Consulting Physician (Radiation Oncology) Donnelly Angelica, RN as Oncology Nurse Navigator Pershing Proud, RN as Oncology Nurse Navigator  CHIEF COMPLAINTS/PURPOSE OF CONSULTATION:  Newly diagnosed breast cancer  HISTORY OF PRESENTING ILLNESS:  Darlene Park 77 y.o. female is here because of recent diagnosis of left IDC  I reviewed her records extensively and collaborated the history with the patient.  SUMMARY OF ONCOLOGIC HISTORY: Oncology History  Malignant neoplasm of central portion of left breast in female, estrogen receptor positive (HCC)  07/29/2022 Mammogram   Bilateral diagnostic mammogram showed 7.7 x 5.8 x 6.8 cm oval high density mass in the left breast suspicious of malignancy.  Ultrasound-guided biopsy was recommended   08/05/2022 Pathology Results   Left breast needle core biopsy showed grade 2 IDC, ER positive strong staining 95%, PR 100% pos strong staining intensity, Ki 2%, Her 2 0   08/10/2022 Initial Diagnosis   Malignant neoplasm of central portion of left breast in female, estrogen receptor positive (HCC)    She wanted to try neoadjuvant anti estrogen therapy, has been taking anastrozole as prescribed.   Discussed the use of AI scribe software for clinical note transcription with the patient, who gave verbal consent to proceed.  History of Present Illness    The patient, with a history of breast cancer, presents for a follow-up visit. She has been on anastrozole, which has resulted in a reduction in the size of the tumor. However, scattered calcifications, measuring approximately eight centimeters, have remained  stable and unchanged. The patient is due to see a surgeon to discuss the possibility of a mastectomy, but expresses a preference to continue with the current medication. The patient's family member advocates for this course of action, citing the patient's age and unwillingness to undergo invasive surgery. The patient also expresses a personal goal to see her great-grandchild, who is due in May.  Rest of the pertinent 10 point ROS reviewed and negative  MEDICAL HISTORY:  Past Medical History:  Diagnosis Date   Anemia    Breast cancer (HCC)    CHICKENPOX, HX OF 11/14/2008   Qualifier: Diagnosis of  By: Charlsie Quest RMA, Lucy     GERD    PT. DENIES.03/17/19   HYPERTENSION    Malignant neoplasm of cecum (HCC) 03/28/2009   resection; No chemo/adj tx per onc   Osteoarthritis of left knee    URINARY INCONTINENCE    VITAMIN D DEFICIENCY     SURGICAL HISTORY: Past Surgical History:  Procedure Laterality Date   ABDOMINAL HYSTERECTOMY  1976   Partial precancer cells done @ [facility] in [place]   COLON SURGERY  12/2008   COLONOSCOPY     HEMORRHOID SURGERY  1980's   POLYPECTOMY      SOCIAL HISTORY: Social History   Socioeconomic History   Marital status: Divorced    Spouse name: Not on file   Number of children: Not on file   Years of education: Not on file   Highest education level: Not on file  Occupational History   Not on file  Tobacco Use   Smoking status: Never   Smokeless tobacco: Never  Vaping Use   Vaping status: Never Used  Substance and  Sexual Activity   Alcohol use: Yes    Alcohol/week: 0.0 standard drinks of alcohol    Comment: socially   Drug use: No   Sexual activity: Not on file  Other Topics Concern   Not on file  Social History Narrative   Retired from [employer] with daughter at her home   Social Drivers of Health   Financial Resource Strain: Low Risk  (07/31/2022)   Overall Financial Resource Strain (CARDIA)    Difficulty of  Paying Living Expenses: Not hard at all  Food Insecurity: No Food Insecurity (07/31/2022)   Hunger Vital Sign    Worried About Running Out of Food in the Last Year: Never true    Ran Out of Food in the Last Year: Never true  Transportation Needs: No Transportation Needs (07/31/2022)   PRAPARE - Administrator, Civil Service (Medical): No    Lack of Transportation (Non-Medical): No  Physical Activity: Sufficiently Active (07/31/2022)   Exercise Vital Sign    Days of Exercise per Week: 5 days    Minutes of Exercise per Session: 30 min  Stress: No Stress Concern Present (07/31/2022)   [employer] of Occupational Health - Occupational Stress Questionnaire    Feeling of Stress : Not at all  Social Connections: Moderately Integrated (07/31/2022)   Social Connection and Isolation Panel [NHANES]    Frequency of Communication with Friends and Family: More than three times a week    Frequency of Social Gatherings with Friends and Family: More than three times a week    Attends Religious Services: More than 4 times per year    Active Member of [employer] or Organizations: No    Attends Engineer, structural: More than 4 times per year    Marital Status: Widowed  Intimate Partner Violence: Not At Risk (07/31/2022)   Humiliation, Afraid, Rape, and Kick questionnaire    Fear of Current or Ex-Partner: No    Emotionally Abused: No    Physically Abused: No    Sexually Abused: No    FAMILY HISTORY: Family History  Adopted: Yes  Problem Relation Age of Onset   Hypertension Other    Heart disease Other    Prostate cancer Other    Colon cancer Neg Hx    Esophageal cancer Neg Hx    Rectal cancer Neg Hx    Stomach cancer Neg Hx    Colon polyps Neg Hx     ALLERGIES:  is allergic to penicillins and crestor [rosuvastatin].  MEDICATIONS:  Current Outpatient Medications  Medication Sig Dispense Refill   Acetaminophen (TYLENOL ARTHRITIS PAIN PO) Take 1 tablet by mouth.     amLODipine  (NORVASC) 5 MG tablet TAKE 1 TABLET (5 MG TOTAL) BY MOUTH DAILY. 90 tablet 3   anastrozole (ARIMIDEX) 1 MG tablet Take 1 tablet (1 mg total) by mouth daily. 90 tablet 3   BENICAR 20 MG tablet TAKE 1 TABLET BY MOUTH EVERY DAY 90 tablet 3   Cholecalciferol 1000 UNIT capsule Take 1,000 Units by mouth daily.     diclofenac sodium (VOLTAREN) 1 % GEL Apply 2 g topically 3 (three) times daily as needed. 100 g 1   pravastatin (PRAVACHOL) 10 MG tablet TAKE 1 TABLET BY MOUTH EVERY DAY 90 tablet 1   No current facility-administered medications for this visit.    REVIEW OF SYSTEMS:   Constitutional: Denies fevers, chills or abnormal night sweats Eyes: Denies blurriness of vision, double vision or  watery eyes Ears, nose, mouth, throat, and face: Denies mucositis or sore throat Respiratory: Denies cough, dyspnea or wheezes Cardiovascular: Denies palpitation, chest discomfort or lower extremity swelling Gastrointestinal:  Denies nausea, heartburn or change in bowel habits Skin: Denies abnormal skin rashes Lymphatics: Denies new lymphadenopathy or easy bruising Neurological:Denies numbness, tingling or new weaknesses Behavioral/Psych: Mood is stable, no new changes  Breast: As mentioned above All other systems were reviewed with the patient and are negative.  PHYSICAL EXAMINATION: ECOG PERFORMANCE STATUS: 0 - Asymptomatic  Vitals:   06/07/23 0955  BP: 132/66  Pulse: (!) 102  Resp: 18  Temp: 97.7 F (36.5 C)  SpO2: 100%    Filed Weights   06/07/23 0955  Weight: 167 lb 14.4 oz (76.2 kg)   General: Alert, oriented and in no acute distress Breasts: Left breast mass now measuring approx 2.5 cms. Smaller and softer. No regional adenopathy.  LABORATORY DATA:  I have reviewed the data as listed Lab Results  Component Value Date   WBC 6.1 08/12/2022   HGB 12.2 08/12/2022   HCT 35.5 (L) 08/12/2022   MCV 87.0 08/12/2022   PLT 334 08/12/2022   Lab Results  Component Value Date   NA 138  08/12/2022   K 3.8 08/12/2022   CL 103 08/12/2022   CO2 27 08/12/2022    RADIOGRAPHIC STUDIES: I have personally reviewed the radiological reports and agreed with the findings in the report.  ASSESSMENT AND PLAN:  Malignant neoplasm of central portion of left breast in female, estrogen receptor positive (HCC) This is a very pleasant 77 year old postmenopausal female patient with newly diagnosed left breast grade 2 IDC, ER/PR strongly positive HER2 negative, low proliferation index referred to breast MDC for additional recommendations.  On review of imaging and physical exam, there appears to be a large mass measuring almost 7 to 8 cm occupying the entire central portion of the breast. She wanted to try neoadjuvant anti estrogen therapy since she was hoping to do a smaller surgery.   Breast Cancer Tumor has decreased in size with Anastrozole treatment, but calcifications remain. Discussed the possibility of needing a mastectomy due to the extent of calcifications. Patient is considering continuing Anastrozole and delaying surgery. -Continue Anastrozole. -Discuss surgical options with Dr. Dwain Sarna on 06/11/2023. -Plan bilateral diagnostic mammogram and ultrasound of the left breast in May 2025. -Return visit in May 2025. -Decision on post-surgical treatment to be made after surgery, if necessary.          All questions were answered. The patient knows to call the clinic with any problems, questions or concerns.    Rachel Moulds, MD 06/07/23

## 2023-06-07 NOTE — Assessment & Plan Note (Signed)
This is a very pleasant 77 year old postmenopausal female patient with newly diagnosed left breast grade 2 IDC, ER/PR strongly positive HER2 negative, low proliferation index referred to breast MDC for additional recommendations.  On review of imaging and physical exam, there appears to be a large mass measuring almost 7 to 8 cm occupying the entire central portion of the breast. She wanted to try neoadjuvant anti estrogen therapy since she was hoping to do a smaller surgery.   Breast Cancer Tumor has decreased in size with Anastrozole treatment, but calcifications remain. Discussed the possibility of needing a mastectomy due to the extent of calcifications. Patient is considering continuing Anastrozole and delaying surgery. -Continue Anastrozole. -Discuss surgical options with Dr. Dwain Sarna on 06/11/2023. -Plan bilateral diagnostic mammogram and ultrasound of the left breast in May 2025. -Return visit in May 2025. -Decision on post-surgical treatment to be made after surgery, if necessary.

## 2023-06-11 DIAGNOSIS — C50112 Malignant neoplasm of central portion of left female breast: Secondary | ICD-10-CM | POA: Diagnosis not present

## 2023-06-11 DIAGNOSIS — Z170 Estrogen receptor positive status [ER+]: Secondary | ICD-10-CM | POA: Diagnosis not present

## 2023-06-14 ENCOUNTER — Encounter: Payer: Self-pay | Admitting: *Deleted

## 2023-06-25 ENCOUNTER — Encounter: Payer: Self-pay | Admitting: *Deleted

## 2023-07-01 ENCOUNTER — Encounter: Payer: Self-pay | Admitting: *Deleted

## 2023-07-15 ENCOUNTER — Ambulatory Visit: Payer: Medicare Other

## 2023-07-15 VITALS — Wt 167.0 lb

## 2023-07-15 DIAGNOSIS — Z0000 Encounter for general adult medical examination without abnormal findings: Secondary | ICD-10-CM

## 2023-07-15 NOTE — Progress Notes (Addendum)
Subjective:   Darlene Park is a 78 y.o. female who presents for an Initial Medicare Annual Wellness Visit.  Visit Complete: Virtual I connected with  TOSCA PLETZ on 07/15/23 by a audio enabled telemedicine application and verified that I am speaking with the correct person using two identifiers.  Patient Location: Home  Provider Location: Office/Clinic  I discussed the limitations of evaluation and management by telemedicine. The patient expressed understanding and agreed to proceed.  Vital Signs: Because this visit was a virtual/telehealth visit, some criteria may be missing or patient reported. Any vitals not documented were not able to be obtained and vitals that have been documented are patient reported.    Cardiac Risk Factors include: advanced age (>81men, >82 women);hypertension;dyslipidemia;obesity (BMI >30kg/m2)     Objective:    Today's Vitals   07/15/23 1543  Weight: 167 lb (75.8 kg)   Body mass index is 31.55 kg/m.     07/15/2023    3:50 PM 08/12/2022   10:04 AM 07/31/2022    2:35 PM 07/16/2021    2:52 PM 06/12/2020   10:16 AM  Advanced Directives  Does Patient Have a Medical Advance Directive? No No Yes No No  Type of Best boy of [place];Living will    Copy of Healthcare Power of Attorney in Chart?  No - copy requested No - copy requested    Would patient like information on creating a medical advance directive? No - Patient declined   Yes (MAU/Ambulatory/Procedural Areas - Information given) No - Patient declined    Current Medications (verified) Outpatient Encounter Medications as of 07/15/2023  Medication Sig   Acetaminophen (TYLENOL ARTHRITIS PAIN PO) Take 1 tablet by mouth.   amLODipine (NORVASC) 5 MG tablet TAKE 1 TABLET (5 MG TOTAL) BY MOUTH DAILY.   anastrozole (ARIMIDEX) 1 MG tablet Take 1 tablet (1 mg total) by mouth daily.   BENICAR 20 MG tablet TAKE 1 TABLET BY MOUTH EVERY DAY   Cholecalciferol 1000  UNIT capsule Take 1,000 Units by mouth daily.   diclofenac sodium (VOLTAREN) 1 % GEL Apply 2 g topically 3 (three) times daily as needed.   pravastatin (PRAVACHOL) 10 MG tablet TAKE 1 TABLET BY MOUTH EVERY DAY (Patient taking differently: 3 (three) times a week. Mon wed and fri)   No facility-administered encounter medications on file as of 07/15/2023.    Allergies (verified) Penicillins and Crestor [rosuvastatin]   History: Past Medical History:  Diagnosis Date   Anemia    Breast cancer (HCC)    CHICKENPOX, HX OF 11/14/2008   Qualifier: Diagnosis of  By: Charlsie Quest RMA, Lucy     GERD    PT. DENIES.03/17/19   HYPERTENSION    Malignant neoplasm of cecum (HCC) 03/28/2009   resection; No chemo/adj tx per onc   Osteoarthritis of left knee    URINARY INCONTINENCE    VITAMIN D DEFICIENCY    Past Surgical History:  Procedure Laterality Date   ABDOMINAL HYSTERECTOMY  1976   Partial precancer cells done @ [facility] in [place]   COLON SURGERY  12/2008   COLONOSCOPY     HEMORRHOID SURGERY  1980's   POLYPECTOMY     Family History  Adopted: Yes  Problem Relation Age of Onset   Hypertension Other    Heart disease Other    Prostate cancer Other    Colon cancer Neg Hx    Esophageal cancer Neg Hx    Rectal cancer Neg Hx  Stomach cancer Neg Hx    Colon polyps Neg Hx    Social History   Socioeconomic History   Marital status: Divorced    Spouse name: Not on file   Number of children: Not on file   Years of education: Not on file   Highest education level: Not on file  Occupational History   Not on file  Tobacco Use   Smoking status: Never   Smokeless tobacco: Never  Vaping Use   Vaping status: Never Used  Substance and Sexual Activity   Alcohol use: Yes    Alcohol/week: 0.0 standard drinks of alcohol    Comment: socially   Drug use: No   Sexual activity: Not on file  Other Topics Concern   Not on file  Social History Narrative   Retired from [employer] with daughter at her home   Social Drivers of Health   Financial Resource Strain: Low Risk  (07/15/2023)   Overall Financial Resource Strain (CARDIA)    Difficulty of Paying Living Expenses: Not hard at all  Food Insecurity: No Food Insecurity (07/15/2023)   Hunger Vital Sign    Worried About Running Out of Food in the Last Year: Never true    Ran Out of Food in the Last Year: Never true  Transportation Needs: No Transportation Needs (07/15/2023)   PRAPARE - Administrator, Civil Service (Medical): No    Lack of Transportation (Non-Medical): No  Physical Activity: Sufficiently Active (07/15/2023)   Exercise Vital Sign    Days of Exercise per Week: 5 days    Minutes of Exercise per Session: 30 min  Stress: No Stress Concern Present (07/15/2023)   [employer] of Occupational Health - Occupational Stress Questionnaire    Feeling of Stress : Not at all  Social Connections: Moderately Isolated (07/15/2023)   Social Connection and Isolation Panel [NHANES]    Frequency of Communication with Friends and Family: More than three times a week    Frequency of Social Gatherings with Friends and Family: More than three times a week    Attends Religious Services: More than 4 times per year    Active Member of [employer] or Organizations: No    Attends Engineer, structural: Never    Marital Status: Divorced    Tobacco Counseling Counseling given: Not Answered   Clinical Intake:  Pre-visit preparation completed: Yes  Pain : No/denies pain     BMI - recorded: 31.55 Nutritional Status: BMI > 30  Obese Nutritional Risks: None Diabetes: No  How often do you need to have someone help you when you read instructions, pamphlets, or other written materials from your doctor or pharmacy?: 1 - Never  Interpreter Needed?: No  Information entered by :: Lanier Ensign, LPN   Activities of Daily Living    07/15/2023    3:47 PM 07/31/2022    2:40 PM  In your  present state of health, do you have any difficulty performing the following activities:  Hearing? 0 0  Vision? 0 0  Difficulty concentrating or making decisions? 0 0  Walking or climbing stairs? 0 0  Dressing or bathing? 0 0  Doing errands, shopping? 0 0  Preparing Food and eating ? N N  Using the Toilet? N N  In the past six months, have you accidently leaked urine? N N  Do you have problems with loss of bowel control? N N  Managing your Medications? N N  Managing  your Finances? N N  Housekeeping or managing your Housekeeping? N N    Patient Care Team: Pincus Sanes, MD as PCP - General (Internal Medicine) Louis Meckel, MD (Inactive) as Consulting Physician (Gastroenterology) Emelia Loron, MD as Consulting Physician (General Surgery) Rachel Moulds, MD as Consulting Physician (Hematology and Oncology) Antony Blackbird, MD as Consulting Physician (Radiation Oncology) Donnelly Angelica, RN as Oncology Nurse Navigator Pershing Proud, RN as Oncology Nurse Navigator  Indicate any recent Medical Services you may have received from other than [facility] providers in the past year (date may be approximate).     Assessment:   This is a routine wellness examination for Amandy.  Hearing/Vision screen Hearing Screening - Comments:: Pt denies any hearing issues  [facility] - Comments:: [facility]    Goals Addressed             This Visit's Progress    Patient Stated       Maintain health and activity        Depression Screen    07/15/2023    3:49 PM 07/31/2022    2:36 PM 07/17/2022    9:28 AM 07/16/2021    2:52 PM 07/11/2021   12:18 PM 06/12/2020   10:15 AM 05/13/2020    9:25 AM  PHQ 2/9 Scores  PHQ - 2 Score 0 0 0 1 1 0 0  PHQ- 9 Score  0 0 4 4  2     Fall Risk    07/15/2023    3:52 PM 07/31/2022    2:36 PM 07/17/2022    9:28 AM 07/16/2021    2:52 PM 07/11/2021    8:40 AM  Fall Risk   Falls in the past year? 0 0 0 0 0   Number falls in past yr: 0 0 0 0 0  Injury with Fall? 0 0 0 0 0  Risk for fall due to : No Fall Risks No Fall Risks No Fall Risks No Fall Risks No Fall Risks  Follow up Falls prevention discussed Falls prevention discussed Falls evaluation completed Falls evaluation completed Falls evaluation completed    MEDICARE RISK AT HOME: Medicare Risk at Home Any stairs in or around the home?: No If so, are there any without handrails?: No Home free of loose throw rugs in walkways, pet beds, electrical cords, etc?: Yes Adequate lighting in your home to reduce risk of falls?: Yes Life alert?: No Use of a cane, walker or w/c?: Yes Grab bars in the bathroom?: No Shower chair or bench in shower?: No Elevated toilet seat or a handicapped toilet?: No  TIMED UP AND GO:  Was the test performed? No    Cognitive Function:        07/15/2023    3:58 PM 07/31/2022    2:38 PM  6CIT Screen  What Year? 0 points 0 points  What month? 0 points 0 points  What time? 0 points 0 points  Count back from 20 0 points 0 points  Months in reverse 0 points 0 points  Repeat phrase 0 points 0 points  Total Score 0 points 0 points    Immunizations Immunization History  Administered Date(s) Administered   Fluad Quad(high Dose 65+) 02/17/2019, 05/13/2020, 07/17/2022   Influenza Split 03/26/2011, 04/14/2012   Influenza Whole 03/10/2010   Influenza, High Dose Seasonal PF 05/01/2016, 05/06/2017, 06/13/2018   Influenza,inj,Quad PF,6+ Mos 06/13/2013   Influenza-Unspecified 04/19/2021   PFIZER(Purple Top)SARS-COV-2 Vaccination  11/25/2019, 12/16/2019, 06/20/2020   Pfizer Covid-19 Vaccine Bivalent Booster 93yrs & up 04/19/2021   Pneumococcal Conjugate-13 11/05/2014   Pneumococcal Polysaccharide-23 01/10/2014   Td 06/22/2001   Tdap 01/10/2014    TDAP status: Due, Education has been provided regarding the importance of this vaccine. Advised may receive this vaccine at local pharmacy or Health Dept. Aware to  provide a copy of the vaccination record if obtained from local pharmacy or Health Dept. Verbalized acceptance and understanding.  Flu Vaccine status: Due, Education has been provided regarding the importance of this vaccine. Advised may receive this vaccine at local pharmacy or Health Dept. Aware to provide a copy of the vaccination record if obtained from local pharmacy or Health Dept. Verbalized acceptance and understanding.  Pneumococcal vaccine status: Up to date  Covid-19 vaccine status: Information provided on how to obtain vaccines.   Qualifies for Shingles Vaccine? Yes   Zostavax completed No   Shingrix Completed?: No.    Education has been provided regarding the importance of this vaccine. Patient has been advised to call insurance company to determine out of pocket expense if they have not yet received this vaccine. Advised may also receive vaccine at local pharmacy or Health Dept. Verbalized acceptance and understanding.  Screening Tests Health Maintenance  Topic Date Due   Zoster Vaccines- Shingrix (1 of 2) Never done   Colonoscopy  03/28/2022   INFLUENZA VACCINE  01/21/2023   COVID-19 Vaccine (5 - 2024-25 season) 02/21/2023   DTaP/Tdap/Td (3 - Td or Tdap) 01/11/2024   Medicare Annual Wellness (AWV)  07/14/2024   DEXA SCAN  07/29/2024   Pneumonia Vaccine 2+ Years old  Completed   Hepatitis C Screening  Completed   HPV VACCINES  Aged Out    Health Maintenance  Health Maintenance Due  Topic Date Due   Zoster Vaccines- Shingrix (1 of 2) Never done   Colonoscopy  03/28/2022   INFLUENZA VACCINE  01/21/2023   COVID-19 Vaccine (5 - 2024-25 season) 02/21/2023    Colorectal cancer screening: Type of screening: Colonoscopy. Completed 03/29/19. Repeat every 3 years  Mammogram status: Ordered scheduled for 08/02/23. Pt provided with contact info and advised to call to schedule appt.   Bone Density status: Ordered placed . Pt provided with contact info and advised to call to  schedule appt.   Additional Screening:   Vision Screening: Recommended annual ophthalmology exams for early detection of glaucoma and other disorders of the eye. Is the patient up to date with their annual eye exam?  Yes  Who is the provider or what is the [facility] of [facility]  If pt is not established with a provider, would they like to be referred to a provider to establish care? No .   Dental Screening: Recommended annual dental exams for proper oral hygiene  Community Resource Referral / Chronic Care Management: CRR required this visit?  No   CCM required this visit?  No     Plan:     I have personally reviewed and noted the following in the patient's chart:   Medical and social history Use of alcohol, tobacco or illicit drugs  Current medications and supplements including opioid prescriptions. Patient is not currently taking opioid prescriptions. Functional ability and status Nutritional status Physical activity Advanced directives List of other physicians Hospitalizations, surgeries, and ER visits in previous 12 months Vitals Screenings to include cognitive, depression, and falls Referrals and appointments  In addition, I  have reviewed and discussed with patient certain preventive protocols, quality metrics, and best practice recommendations. A written personalized care plan for preventive services as well as general preventive health recommendations were provided to patient.     Marzella Schlein, LPN   [id number]/61/0960   After Visit Summary: (Pick Up) Due to this being a telephonic visit, with patients personalized plan was offered to patient and patient has requested to Pick up at office.  Nurse Notes: none   Medical screening examination/treatment/procedure(s) were performed by non-physician practitioner and as supervising physician I was immediately available for consultation/collaboration.  I agree with above.  Jacinta Shoe, MD

## 2023-07-15 NOTE — Patient Instructions (Addendum)
Darlene Park , Thank you for taking time to come for your Medicare Wellness Visit. I appreciate your ongoing commitment to your health goals. Please review the following plan we discussed and let me know if I can assist you in the future.   Referrals/Orders/Follow-Ups/Clinician Recommendations: Aim for 30 minutes of exercise or brisk walking, 6-8 glasses of water, and 5 servings of fruits and vegetables each day.   This is a list of the screening recommended for you and due dates:  Health Maintenance  Topic Date Due   Zoster (Shingles) Vaccine (1 of 2) Never done   Colon Cancer Screening  03/28/2022   Flu Shot  01/21/2023   COVID-19 Vaccine (5 - 2024-25 season) 02/21/2023   Medicare Annual Wellness Visit  08/01/2023   DTaP/Tdap/Td vaccine (3 - Td or Tdap) 01/11/2024   DEXA scan (bone density measurement)  07/29/2024   Pneumonia Vaccine  Completed   Hepatitis C Screening  Completed   HPV Vaccine  Aged Out    Advanced directives: (Declined) Advance directive discussed with you today. Even though you declined this today, please call our office should you change your mind, and we can give you the proper paperwork for you to fill out.  Next Medicare Annual Wellness Visit scheduled for next year: Yes   You have an order for:  []   2D Mammogram  []   3D Mammogram  []   Bone Density     Please call for appointment:  [facility] [address] [place], [place] [address] [phone]  [facility] [address] [place], [place] [address] [phone]  [facility] [address] [place], [place] [address] [phone]  [facility] [address] [place], [place] [address] [phone]  [facility] [address] [place], [place] [address] [phone]    Make sure to wear two-piece clothing.  No lotions, powders, or deodorants the day of  the appointment. Make sure to bring picture ID and insurance card.  Bring list of medications you are currently taking including any supplements.   Schedule your [place] screening mammogram through MyChart!   Log into your MyChart account.  Go to 'Visit' (or 'Appointments' if on mobile App) --> Schedule an Appointment  Under 'Select a Reason for Visit' choose the Mammogram Screening option.  Complete the pre-visit questions and select the time and place that best fits your schedule.

## 2023-07-18 ENCOUNTER — Encounter: Payer: Self-pay | Admitting: Internal Medicine

## 2023-07-18 NOTE — Patient Instructions (Addendum)
Flu immunization administered today.      Blood work was ordered.       Medications changes include :   None     Return in about 1 year (around 07/18/2024) for Physical Exam.    Health Maintenance, Female Adopting a healthy lifestyle and getting preventive care are important in promoting health and wellness. Ask your health care provider about: The right schedule for you to have regular tests and exams. Things you can do on your own to prevent diseases and keep yourself healthy. What should I know about diet, weight, and exercise? Eat a healthy diet  Eat a diet that includes plenty of vegetables, fruits, low-fat dairy products, and lean protein. Do not eat a lot of foods that are high in solid fats, added sugars, or sodium. Maintain a healthy weight Body mass index (BMI) is used to identify weight problems. It estimates body fat based on height and weight. Your health care provider can help determine your BMI and help you achieve or maintain a healthy weight. Get regular exercise Get regular exercise. This is one of the most important things you can do for your health. Most adults should: Exercise for at least 150 minutes each week. The exercise should increase your heart rate and make you sweat (moderate-intensity exercise). Do strengthening exercises at least twice a week. This is in addition to the moderate-intensity exercise. Spend less time sitting. Even light physical activity can be beneficial. Watch cholesterol and blood lipids Have your blood tested for lipids and cholesterol at 80 years of age, then have this test every 5 years. Have your cholesterol levels checked more often if: Your lipid or cholesterol levels are high. You are older than 80 years of age. You are at high risk for heart disease. What should I know about cancer screening? Depending on your health history and family history, you may need to have cancer screening at various ages. This may include  screening for: Breast cancer. Cervical cancer. Colorectal cancer. Skin cancer. Lung cancer. What should I know about heart disease, diabetes, and high blood pressure? Blood pressure and heart disease High blood pressure causes heart disease and increases the risk of stroke. This is more likely to develop in people who have high blood pressure readings or are overweight. Have your blood pressure checked: Every 3-5 years if you are 77-16 years of age. Every year if you are 80 years old or older. Diabetes Have regular diabetes screenings. This checks your fasting blood sugar level. Have the screening done: Once every three years after age 33 if you are at a normal weight and have a low risk for diabetes. More often and at a younger age if you are overweight or have a high risk for diabetes. What should I know about preventing infection? Hepatitis B If you have a higher risk for hepatitis B, you should be screened for this virus. Talk with your health care provider to find out if you are at risk for hepatitis B infection. Hepatitis C Testing is recommended for: Everyone born from [birth year] through 1967. Anyone with known risk factors for hepatitis C. Sexually transmitted infections (STIs) Get screened for STIs, including gonorrhea and chlamydia, if: You are sexually active and are younger than 81 years of age. You are older than 80 years of age and your health care provider tells you that you are at risk for this type of infection. Your sexual activity has changed since you were last screened, and you  are at increased risk for chlamydia or gonorrhea. Ask your health care provider if you are at risk. Ask your health care provider about whether you are at high risk for HIV. Your health care provider may recommend a prescription medicine to help prevent HIV infection. If you choose to take medicine to prevent HIV, you should first get tested for HIV. You should then be tested every 3 months for as  long as you are taking the medicine. Pregnancy If you are about to stop having your period (premenopausal) and you may become pregnant, seek counseling before you get pregnant. Take 400 to 800 micrograms (mcg) of folic acid every day if you become pregnant. Ask for birth control (contraception) if you want to prevent pregnancy. Osteoporosis and menopause Osteoporosis is a disease in which the bones lose minerals and strength with aging. This can result in bone fractures. If you are 43 years old or older, or if you are at risk for osteoporosis and fractures, ask your health care provider if you should: Be screened for bone loss. Take a calcium or vitamin D supplement to lower your risk of fractures. Be given hormone replacement therapy (HRT) to treat symptoms of menopause. Follow these instructions at home: Alcohol use Do not drink alcohol if: Your health care provider tells you not to drink. You are pregnant, may be pregnant, or are planning to become pregnant. If you drink alcohol: Limit how much you have to: 0-1 drink a day. Know how much alcohol is in your drink. In the [place] one drink equals one 12 oz bottle of beer (355 mL), one 5 oz glass of wine (148 mL), or one 1 oz glass of hard liquor (44 mL). Lifestyle Do not use any products that contain nicotine or tobacco. These products include cigarettes, chewing tobacco, and vaping devices, such as e-cigarettes. If you need help quitting, ask your health care provider. Do not use street drugs. Do not share needles. Ask your health care provider for help if you need support or information about quitting drugs. General instructions Schedule regular health, dental, and eye exams. Stay current with your vaccines. Tell your health care provider if: You often feel depressed. You have ever been abused or do not feel safe at home. Summary Adopting a healthy lifestyle and getting preventive care are important in promoting health and  wellness. Follow your health care provider's instructions about healthy diet, exercising, and getting tested or screened for diseases. Follow your health care provider's instructions on monitoring your cholesterol and blood pressure. This information is not intended to replace advice given to you by your health care provider. Make sure you discuss any questions you have with your health care provider. Document Revised: 10/28/2020 Document Reviewed: 10/28/2020 Elsevier Patient Education  2024 [employer].

## 2023-07-18 NOTE — Progress Notes (Unsigned)
Subjective:    Patient ID: Darlene Park, female    DOB: 04-03-1946, 78 y.o.   MRN: [id number]      HPI Shirel is here for a Physical exam and her chronic medical problems.      Medications and allergies reviewed with patient and updated if appropriate.  Current Outpatient Medications on File Prior to Visit  Medication Sig Dispense Refill   Acetaminophen (TYLENOL ARTHRITIS PAIN PO) Take 1 tablet by mouth.     amLODipine (NORVASC) 5 MG tablet TAKE 1 TABLET (5 MG TOTAL) BY MOUTH DAILY. 90 tablet 3   anastrozole (ARIMIDEX) 1 MG tablet Take 1 tablet (1 mg total) by mouth daily. 90 tablet 3   BENICAR 20 MG tablet TAKE 1 TABLET BY MOUTH EVERY DAY 90 tablet 3   Cholecalciferol 1000 UNIT capsule Take 1,000 Units by mouth daily.     diclofenac sodium (VOLTAREN) 1 % GEL Apply 2 g topically 3 (three) times daily as needed. 100 g 1   pravastatin (PRAVACHOL) 10 MG tablet TAKE 1 TABLET BY MOUTH EVERY DAY (Patient taking differently: 3 (three) times a week. Mon wed and fri) 90 tablet 1   No current facility-administered medications on file prior to visit.    Review of Systems     Objective:  There were no vitals filed for this visit. There were no vitals filed for this visit. There is no height or weight on file to calculate BMI.  BP Readings from Last 3 Encounters:  06/07/23 132/66  03/10/23 (!) 124/58  12/08/22 (!) 149/98    Wt Readings from Last 3 Encounters:  07/15/23 167 lb (75.8 kg)  06/07/23 167 lb 14.4 oz (76.2 kg)  03/10/23 169 lb 9.6 oz (76.9 kg)       Physical Exam Constitutional: She appears well-developed and well-nourished. No distress.  HENT:  Head: Normocephalic and atraumatic.  Right Ear: External ear normal. Normal ear canal and TM Left Ear: External ear normal.  Normal ear canal and TM Mouth/Throat: Oropharynx is clear and moist.  Eyes: Conjunctivae normal.  Neck: Neck supple. No tracheal deviation present. No thyromegaly present.  No  carotid bruit  Cardiovascular: Normal rate, regular rhythm and normal heart sounds.   No murmur heard.  No edema. Pulmonary/Chest: Effort normal and breath sounds normal. No respiratory distress. She has no wheezes. She has no rales.  Breast: deferred   Abdominal: Soft. She exhibits no distension. There is no tenderness.  Lymphadenopathy: She has no cervical adenopathy.  Skin: Skin is warm and dry. She is not diaphoretic.  Psychiatric: She has a normal mood and affect. Her behavior is normal.     Lab Results  Component Value Date   WBC 6.1 08/12/2022   HGB 12.2 08/12/2022   HCT 35.5 (L) 08/12/2022   PLT 334 08/12/2022   GLUCOSE 95 08/12/2022   CHOL 168 07/17/2022   TRIG 75.0 07/17/2022   HDL 52.20 07/17/2022   LDLCALC 101 (H) 07/17/2022   ALT 9 08/12/2022   AST 17 08/12/2022   NA 138 08/12/2022   K 3.8 08/12/2022   CL 103 08/12/2022   CREATININE 0.96 08/12/2022   BUN 10 08/12/2022   CO2 27 08/12/2022   TSH 0.48 07/17/2022         Assessment & Plan:   Physical exam: Screening blood work  ordered Exercise   Weight   Substance abuse  none   Reviewed recommended immunizations.   Health Maintenance  Topic Date Due  Zoster Vaccines- Shingrix (1 of 2) Never done   Colonoscopy  03/28/2022   INFLUENZA VACCINE  01/21/2023   COVID-19 Vaccine (5 - 2024-25 season) 02/21/2023   DTaP/Tdap/Td (3 - Td or Tdap) 01/11/2024   Medicare Annual Wellness (AWV)  07/14/2024   DEXA SCAN  07/29/2024   Pneumonia Vaccine 59+ Years old  Completed   Hepatitis C Screening  Completed   HPV VACCINES  Aged Out          See Problem List for Assessment and Plan of chronic medical problems.

## 2023-07-19 ENCOUNTER — Ambulatory Visit (INDEPENDENT_AMBULATORY_CARE_PROVIDER_SITE_OTHER): Payer: Medicare Other | Admitting: Internal Medicine

## 2023-07-19 VITALS — BP 130/78 | HR 72 | Temp 97.9°F | Ht 61.0 in | Wt 165.0 lb

## 2023-07-19 DIAGNOSIS — Z85038 Personal history of other malignant neoplasm of large intestine: Secondary | ICD-10-CM

## 2023-07-19 DIAGNOSIS — N1831 Chronic kidney disease, stage 3a: Secondary | ICD-10-CM | POA: Diagnosis not present

## 2023-07-19 DIAGNOSIS — Z0000 Encounter for general adult medical examination without abnormal findings: Secondary | ICD-10-CM | POA: Diagnosis not present

## 2023-07-19 DIAGNOSIS — E66811 Obesity, class 1: Secondary | ICD-10-CM

## 2023-07-19 DIAGNOSIS — M8589 Other specified disorders of bone density and structure, multiple sites: Secondary | ICD-10-CM | POA: Diagnosis not present

## 2023-07-19 DIAGNOSIS — R600 Localized edema: Secondary | ICD-10-CM | POA: Diagnosis not present

## 2023-07-19 DIAGNOSIS — E6609 Other obesity due to excess calories: Secondary | ICD-10-CM

## 2023-07-19 DIAGNOSIS — Z23 Encounter for immunization: Secondary | ICD-10-CM | POA: Diagnosis not present

## 2023-07-19 DIAGNOSIS — E559 Vitamin D deficiency, unspecified: Secondary | ICD-10-CM | POA: Diagnosis not present

## 2023-07-19 DIAGNOSIS — E782 Mixed hyperlipidemia: Secondary | ICD-10-CM

## 2023-07-19 DIAGNOSIS — I10 Essential (primary) hypertension: Secondary | ICD-10-CM

## 2023-07-19 DIAGNOSIS — Z6831 Body mass index (BMI) 31.0-31.9, adult: Secondary | ICD-10-CM

## 2023-07-19 LAB — CBC WITH DIFFERENTIAL/PLATELET
Basophils Absolute: 0.1 10*3/uL (ref 0.0–0.1)
Basophils Relative: 0.9 % (ref 0.0–3.0)
Eosinophils Absolute: 0.2 10*3/uL (ref 0.0–0.7)
Eosinophils Relative: 2.7 % (ref 0.0–5.0)
HCT: 38.5 % (ref 36.0–46.0)
Hemoglobin: 12.8 g/dL (ref 12.0–15.0)
Lymphocytes Relative: 29.9 % (ref 12.0–46.0)
Lymphs Abs: 2 10*3/uL (ref 0.7–4.0)
MCHC: 33.1 g/dL (ref 30.0–36.0)
MCV: 90.3 fL (ref 78.0–100.0)
Monocytes Absolute: 0.5 10*3/uL (ref 0.1–1.0)
Monocytes Relative: 7.6 % (ref 3.0–12.0)
Neutro Abs: 3.9 10*3/uL (ref 1.4–7.7)
Neutrophils Relative %: 58.9 % (ref 43.0–77.0)
Platelets: 318 10*3/uL (ref 150.0–400.0)
RBC: 4.26 Mil/uL (ref 3.87–5.11)
RDW: 13.7 % (ref 11.5–15.5)
WBC: 6.6 10*3/uL (ref 4.0–10.5)

## 2023-07-19 LAB — COMPREHENSIVE METABOLIC PANEL
ALT: 10 U/L (ref 0–35)
AST: 19 U/L (ref 0–37)
Albumin: 4.6 g/dL (ref 3.5–5.2)
Alkaline Phosphatase: 90 U/L (ref 39–117)
BUN: 11 mg/dL (ref 6–23)
CO2: 26 meq/L (ref 19–32)
Calcium: 10.1 mg/dL (ref 8.4–10.5)
Chloride: 101 meq/L (ref 96–112)
Creatinine, Ser: 1 mg/dL (ref 0.40–1.20)
GFR: 53.55 mL/min — ABNORMAL LOW (ref >60.00)
Glucose, Bld: 80 mg/dL (ref 70–99)
Potassium: 3.7 meq/L (ref 3.5–5.1)
Sodium: 138 meq/L (ref 135–145)
Total Bilirubin: 0.4 mg/dL (ref 0.2–1.2)
Total Protein: 7.8 g/dL (ref 6.0–8.3)

## 2023-07-19 LAB — TSH: TSH: 0.52 u[IU]/mL (ref 0.35–5.50)

## 2023-07-19 LAB — LIPID PANEL
Cholesterol: 177 mg/dL (ref 0–200)
HDL: 54.5 mg/dL (ref >39.00)
LDL Cholesterol: 105 mg/dL — ABNORMAL HIGH (ref 0–99)
NonHDL: 122.84
Total CHOL/HDL Ratio: 3
Triglycerides: 91 mg/dL (ref 0.0–149.0)
VLDL: 18.2 mg/dL (ref 0.0–40.0)

## 2023-07-19 LAB — VITAMIN D 25 HYDROXY (VIT D DEFICIENCY, FRACTURES): VITD: 61.04 ng/mL (ref 30.00–100.00)

## 2023-07-19 NOTE — Assessment & Plan Note (Addendum)
Chronic Mild obesity She has lost some weight She is not exercising as regularly-tends to only exercise in the nicer weather-stressed regular exercise and she will work on increasing that [employer] encouraged

## 2023-07-19 NOTE — Assessment & Plan Note (Addendum)
Chronic On anastrozole DEXA up-to-date Stressed regular exercise Encouraged vitamin D daily, high calcium diet with some calcium supplementation

## 2023-07-19 NOTE — Assessment & Plan Note (Signed)
Chronic Continue vitamin D supplementation Check vitamin D level

## 2023-07-19 NOTE — Assessment & Plan Note (Addendum)
Chronic No edema on exam Encouraged elevating legs when sitting Increase regular exercise Low-sodium diet Compression socks

## 2023-07-19 NOTE — Assessment & Plan Note (Addendum)
Chronic Blood pressure controlled CMP, CBC

## 2023-07-19 NOTE — Assessment & Plan Note (Signed)
Chronic Blood pressure well controlled CMP, cbc Continue amlodipine 5 mg daily, Benicar 20 mg daily

## 2023-07-19 NOTE — Assessment & Plan Note (Signed)
Chronic Regular exercise and healthy diet encouraged Check lipid panel, cmp, tsh  Continue pravastatin 10 mg 3 times / week

## 2023-07-19 NOTE — Assessment & Plan Note (Signed)
Due for colonoscopy-she will schedule

## 2023-08-02 ENCOUNTER — Ambulatory Visit
Admission: RE | Admit: 2023-08-02 | Discharge: 2023-08-02 | Disposition: A | Discharge status: Home / Self Care | Payer: Medicare Other | Source: Ambulatory Visit | Attending: Hematology and Oncology

## 2023-08-02 ENCOUNTER — Ambulatory Visit
Admission: RE | Admit: 2023-08-02 | Discharge: 2023-08-02 | Disposition: A | Discharge status: Home / Self Care | Payer: Medicare Other | Source: Ambulatory Visit | Attending: Hematology and Oncology | Admitting: Hematology and Oncology

## 2023-08-02 ENCOUNTER — Other Ambulatory Visit: Payer: Self-pay | Admitting: Hematology and Oncology

## 2023-08-02 DIAGNOSIS — R921 Mammographic calcification found on diagnostic imaging of breast: Secondary | ICD-10-CM | POA: Diagnosis not present

## 2023-08-02 DIAGNOSIS — Z170 Estrogen receptor positive status [ER+]: Secondary | ICD-10-CM

## 2023-08-02 DIAGNOSIS — C50412 Malignant neoplasm of upper-outer quadrant of left female breast: Secondary | ICD-10-CM | POA: Diagnosis not present

## 2023-08-03 ENCOUNTER — Encounter: Payer: Self-pay | Admitting: *Deleted

## 2023-08-05 ENCOUNTER — Telehealth: Payer: Self-pay | Admitting: Hematology and Oncology

## 2023-08-05 NOTE — Telephone Encounter (Signed)
Patient is aware of scheduled appointment times/dates

## 2023-08-07 ENCOUNTER — Other Ambulatory Visit: Payer: Self-pay | Admitting: Internal Medicine

## 2023-08-09 ENCOUNTER — Other Ambulatory Visit: Payer: Self-pay | Admitting: Hematology and Oncology

## 2023-08-10 ENCOUNTER — Encounter: Payer: Self-pay | Admitting: *Deleted

## 2023-08-12 ENCOUNTER — Inpatient Hospital Stay: Payer: Medicare Other | Attending: Hematology and Oncology | Admitting: Hematology and Oncology

## 2023-08-12 ENCOUNTER — Encounter: Payer: Self-pay | Admitting: Hematology and Oncology

## 2023-08-12 ENCOUNTER — Encounter: Payer: Self-pay | Admitting: *Deleted

## 2023-08-12 VITALS — BP 143/71 | HR 105 | Temp 97.9°F | Resp 18 | Wt 165.6 lb

## 2023-08-12 DIAGNOSIS — Z8042 Family history of malignant neoplasm of prostate: Secondary | ICD-10-CM | POA: Insufficient documentation

## 2023-08-12 DIAGNOSIS — Z170 Estrogen receptor positive status [ER+]: Secondary | ICD-10-CM | POA: Insufficient documentation

## 2023-08-12 DIAGNOSIS — C50112 Malignant neoplasm of central portion of left female breast: Secondary | ICD-10-CM | POA: Insufficient documentation

## 2023-08-12 DIAGNOSIS — Z1732 Human epidermal growth factor receptor 2 negative status: Secondary | ICD-10-CM | POA: Insufficient documentation

## 2023-08-12 DIAGNOSIS — Z79811 Long term (current) use of aromatase inhibitors: Secondary | ICD-10-CM | POA: Insufficient documentation

## 2023-08-12 DIAGNOSIS — Z1721 Progesterone receptor positive status: Secondary | ICD-10-CM | POA: Diagnosis not present

## 2023-08-12 NOTE — Progress Notes (Signed)
 [facility] CONSULT NOTE  Patient Care Team: Pincus Sanes, MD as PCP - General (Internal Medicine) Louis Meckel, MD (Inactive) as Consulting Physician (Gastroenterology) Emelia Loron, MD as Consulting Physician (General Surgery) Rachel Moulds, MD as Consulting Physician (Hematology and Oncology) Antony Blackbird, MD as Consulting Physician (Radiation Oncology) Donnelly Angelica, RN as Oncology Nurse Navigator Pershing Proud, RN as Oncology Nurse Navigator  CHIEF COMPLAINTS/PURPOSE OF CONSULTATION:  Newly diagnosed breast cancer  HISTORY OF PRESENTING ILLNESS:  Darlene Park 78 y.o. female is here because of recent diagnosis of left IDC  I reviewed her records extensively and collaborated the history with the patient.  SUMMARY OF ONCOLOGIC HISTORY: Oncology History  Malignant neoplasm of central portion of left breast in female, estrogen receptor positive (HCC)  07/29/2022 Mammogram   Bilateral diagnostic mammogram showed 7.7 x 5.8 x 6.8 cm oval high density mass in the left breast suspicious of malignancy.  Ultrasound-guided biopsy was recommended   08/05/2022 Pathology Results   Left breast needle core biopsy showed grade 2 IDC, ER positive strong staining 95%, PR 100% pos strong staining intensity, Ki 2%, Her 2 0   08/10/2022 Initial Diagnosis   Malignant neoplasm of central portion of left breast in female, estrogen receptor positive (HCC)   08/12/2022 Cancer Staging   Staging form: Breast, AJCC 8th Edition - Clinical stage from 08/12/2022: Stage IIA (cT3, cN0, cM0, G2, ER+, PR+, HER2-) - Signed by Rachel Moulds, MD on 08/12/2023 Stage prefix: Initial diagnosis Histologic grading system: 3 grade system    She wanted to try neoadjuvant anti estrogen therapy, has been taking anastrozole as prescribed.   Discussed the use of AI scribe software for clinical note transcription with the patient, who gave verbal consent to proceed.  History of Present  Illness    Darlene Park is a 77 year old female with breast cancer who presents for follow-up regarding her treatment plan.   She is currently on antiestrogen therapy for breast cancer.  The breast mass has not grown since November and is slowly responding to treatment, becoming softer to the touch. She is currently taking anastrozole and reports no new side effects. She is handling the medication well without any issues. Her appetite is good, her bowels are moving regularly, and she has no changes in her breathing. She recently picked up a 90-day supply of her medication.  She is still reluctant about mastectomy but happens to be coming to a conclusion that this may be inevitable.  She is hoping to at least postpone it till the spring or summer.  Rest of the pertinent 10 point ROS reviewed and negative  MEDICAL HISTORY:  Past Medical History:  Diagnosis Date   Anemia    Breast cancer (HCC)    CHICKENPOX, HX OF 11/14/2008   Qualifier: Diagnosis of  By: Charlsie Quest RMA, Lucy     GERD    PT. DENIES.03/17/19   HYPERTENSION    Malignant neoplasm of cecum (HCC) 03/28/2009   resection; No chemo/adj tx per onc   Osteoarthritis of left knee    URINARY INCONTINENCE    VITAMIN D DEFICIENCY     SURGICAL HISTORY: Past Surgical History:  Procedure Laterality Date   ABDOMINAL HYSTERECTOMY  1976   Partial precancer cells done @ [facility] in [place]   COLON SURGERY  12/2008   COLONOSCOPY     HEMORRHOID SURGERY  1980's   POLYPECTOMY      SOCIAL HISTORY: Social History  Socioeconomic History   Marital status: Divorced    Spouse name: Not on file   Number of children: Not on file   Years of education: Not on file   Highest education level: Not on file  Occupational History   Not on file  Tobacco Use   Smoking status: Never   Smokeless tobacco: Never  Vaping Use   Vaping status: Never Used  Substance and Sexual Activity   Alcohol use: Yes    Alcohol/week: 0.0 standard  drinks of alcohol    Comment: socially   Drug use: No   Sexual activity: Not on file  Other Topics Concern   Not on file  Social History Narrative   Retired from [employer] with daughter at her home   Social Drivers of Health   Financial Resource Strain: Low Risk  (07/15/2023)   Overall Financial Resource Strain (CARDIA)    Difficulty of Paying Living Expenses: Not hard at all  Food Insecurity: No Food Insecurity (07/15/2023)   Hunger Vital Sign    Worried About Running Out of Food in the Last Year: Never true    Ran Out of Food in the Last Year: Never true  Transportation Needs: No Transportation Needs (07/15/2023)   PRAPARE - Administrator, Civil Service (Medical): No    Lack of Transportation (Non-Medical): No  Physical Activity: Sufficiently Active (07/15/2023)   Exercise Vital Sign    Days of Exercise per Week: 5 days    Minutes of Exercise per Session: 30 min  Stress: No Stress Concern Present (07/15/2023)   [employer] of Occupational Health - Occupational Stress Questionnaire    Feeling of Stress : Not at all  Social Connections: Moderately Isolated (07/15/2023)   Social Connection and Isolation Panel [NHANES]    Frequency of Communication with Friends and Family: More than three times a week    Frequency of Social Gatherings with Friends and Family: More than three times a week    Attends Religious Services: More than 4 times per year    Active Member of [employer] or Organizations: No    Attends Banker Meetings: Never    Marital Status: Divorced  Catering manager Violence: Not At Risk (07/15/2023)   Humiliation, Afraid, Rape, and Kick questionnaire    Fear of Current or Ex-Partner: No    Emotionally Abused: No    Physically Abused: No    Sexually Abused: No    FAMILY HISTORY: Family History  Adopted: Yes  Problem Relation Age of Onset   Hypertension Other    Heart disease Other    Prostate cancer Other    Colon  cancer Neg Hx    Esophageal cancer Neg Hx    Rectal cancer Neg Hx    Stomach cancer Neg Hx    Colon polyps Neg Hx     ALLERGIES:  is allergic to penicillins and crestor [rosuvastatin].  MEDICATIONS:  Current Outpatient Medications  Medication Sig Dispense Refill   Acetaminophen (TYLENOL ARTHRITIS PAIN PO) Take 1 tablet by mouth.     amLODipine (NORVASC) 5 MG tablet TAKE 1 TABLET (5 MG TOTAL) BY MOUTH DAILY. 90 tablet 3   anastrozole (ARIMIDEX) 1 MG tablet TAKE 1 TABLET BY MOUTH EVERY DAY 90 tablet 3   BENICAR 20 MG tablet TAKE 1 TABLET BY MOUTH EVERY DAY 90 tablet 3   Cholecalciferol 1000 UNIT capsule Take 1,000 Units by mouth daily.     diclofenac sodium (VOLTAREN) 1 %  GEL Apply 2 g topically 3 (three) times daily as needed. 100 g 1   pravastatin (PRAVACHOL) 10 MG tablet TAKE 1 TABLET BY MOUTH EVERY DAY (Patient taking differently: 3 (three) times a week. Mon wed and fri) 90 tablet 1   No current facility-administered medications for this visit.    REVIEW OF SYSTEMS:   Constitutional: Denies fevers, chills or abnormal night sweats Eyes: Denies blurriness of vision, double vision or watery eyes Ears, nose, mouth, throat, and face: Denies mucositis or sore throat Respiratory: Denies cough, dyspnea or wheezes Cardiovascular: Denies palpitation, chest discomfort or lower extremity swelling Gastrointestinal:  Denies nausea, heartburn or change in bowel habits Skin: Denies abnormal skin rashes Lymphatics: Denies new lymphadenopathy or easy bruising Neurological:Denies numbness, tingling or new weaknesses Behavioral/Psych: Mood is stable, no new changes  Breast: As mentioned above All other systems were reviewed with the patient and are negative.  PHYSICAL EXAMINATION: ECOG PERFORMANCE STATUS: 0 - Asymptomatic  Vitals:   08/12/23 1107  BP: (!) 143/71  Pulse: (!) 105  Resp: 18  Temp: 97.9 F (36.6 C)  SpO2: 99%    Filed Weights   08/12/23 1107  Weight: 165 lb 9.6 oz (75.1  kg)   General: Alert, oriented and in no acute distress Breasts: Left breast mass in the upper outer quadrant, less defined than previous, softer to palpation.  No palpable regional adenopathy.  LABORATORY DATA:  I have reviewed the data as listed Lab Results  Component Value Date   WBC 6.6 07/19/2023   HGB 12.8 07/19/2023   HCT 38.5 07/19/2023   MCV 90.3 07/19/2023   PLT 318.0 07/19/2023   Lab Results  Component Value Date   NA 138 07/19/2023   K 3.7 07/19/2023   CL 101 07/19/2023   CO2 26 07/19/2023    RADIOGRAPHIC STUDIES: I have personally reviewed the radiological reports and agreed with the findings in the report.  ASSESSMENT AND PLAN:  Malignant neoplasm of central portion of left breast in female, estrogen receptor positive (HCC) This is a very pleasant 78 year old postmenopausal female patient with newly diagnosed left breast grade 2 IDC, ER/PR strongly positive HER2 negative, low proliferation index referred to breast [facility] for additional recommendations.  Breast Cancer Tumor has decreased significantly, her original size was closer to 6 to 7 cm and his tumor size is about 3 cm.  Discussed the possibility of needing a mastectomy due to the extent of calcifications.  She had a recent meeting with Dr. Dwain Sarna.  It appears that she was given biopsies of these calcifications if she is taking I think she had excellent response with more than 50%-normal tumor size compared to usual presentation with neoadjuvant antiestrogen therapy. Clearly explained to her that microcalcifications may not necessarily respond to the antiestrogen therapy very quickly. She is hoping to at least postpone surgery until summer or spring.  Once again she will continue anastrozole.  She will continue every 23-month ultrasounds unless surgery is possible   All questions were answered. The patient knows to call the clinic with any problems, questions or concerns.    Rachel Moulds, MD 08/12/23

## 2023-08-12 NOTE — Assessment & Plan Note (Signed)
 This is a very pleasant 78 year old postmenopausal female patient with newly diagnosed left breast grade 2 IDC, ER/PR strongly positive HER2 negative, low proliferation index referred to breast MDC for additional recommendations.  Breast Cancer Tumor has decreased significantly, her original size was closer to 6 to 7 cm and his tumor size is about 3 cm.  Discussed the possibility of needing a mastectomy due to the extent of calcifications.  She had a recent meeting with Dr. Dwain Sarna.  It appears that she was given biopsies of these calcifications if she is taking I think she had excellent response with more than 50%-normal tumor size compared to usual presentation with neoadjuvant antiestrogen therapy. Clearly explained to her that microcalcifications may not necessarily respond to the antiestrogen therapy very quickly. She is hoping to at least postpone surgery until summer or spring.  Once again she will continue anastrozole.  She will continue every 48-month ultrasounds unless surgery is possible

## 2023-12-02 ENCOUNTER — Telehealth: Payer: Self-pay

## 2023-12-02 NOTE — Telephone Encounter (Signed)
 Left message to confirm appt for 6/16

## 2023-12-06 ENCOUNTER — Inpatient Hospital Stay: Payer: Medicare Other | Attending: Hematology and Oncology | Admitting: Hematology and Oncology

## 2023-12-06 VITALS — BP 148/60 | HR 81 | Temp 98.6°F | Resp 17 | Ht 61.0 in | Wt 159.9 lb

## 2023-12-06 DIAGNOSIS — Z8042 Family history of malignant neoplasm of prostate: Secondary | ICD-10-CM | POA: Diagnosis not present

## 2023-12-06 DIAGNOSIS — Z1732 Human epidermal growth factor receptor 2 negative status: Secondary | ICD-10-CM | POA: Insufficient documentation

## 2023-12-06 DIAGNOSIS — Z79811 Long term (current) use of aromatase inhibitors: Secondary | ICD-10-CM | POA: Diagnosis not present

## 2023-12-06 DIAGNOSIS — Z170 Estrogen receptor positive status [ER+]: Secondary | ICD-10-CM | POA: Diagnosis not present

## 2023-12-06 DIAGNOSIS — Z1721 Progesterone receptor positive status: Secondary | ICD-10-CM | POA: Diagnosis not present

## 2023-12-06 DIAGNOSIS — C50112 Malignant neoplasm of central portion of left female breast: Secondary | ICD-10-CM | POA: Diagnosis not present

## 2023-12-06 NOTE — Progress Notes (Signed)
 [facility] CONSULT NOTE  Patient Care Team: Colene Dauphin, MD as PCP - General (Internal Medicine) Claudette Cue, MD (Inactive) as Consulting Physician (Gastroenterology) Enid Harry, MD as Consulting Physician (General Surgery) Murleen Arms, MD as Consulting Physician (Hematology and Oncology) Retta Caster, MD as Consulting Physician (Radiation Oncology) Alane Hsu, RN as Oncology Nurse Navigator Auther Bo, RN as Oncology Nurse Navigator  CHIEF COMPLAINTS/PURPOSE OF CONSULTATION:  Newly diagnosed breast cancer  HISTORY OF PRESENTING ILLNESS:  Darlene Park 78 y.o. female is here because of recent diagnosis of left IDC  I reviewed her records extensively and collaborated the history with the patient.  SUMMARY OF ONCOLOGIC HISTORY: Oncology History  Malignant neoplasm of central portion of left breast in female, estrogen receptor positive (HCC)  07/29/2022 Mammogram   Bilateral diagnostic mammogram showed 7.7 x 5.8 x 6.8 cm oval high density mass in the left breast suspicious of malignancy.  Ultrasound-guided biopsy was recommended   08/05/2022 Pathology Results   Left breast needle core biopsy showed grade 2 IDC, ER positive strong staining 95%, PR 100% pos strong staining intensity, Ki 2%, Her 2 0   08/10/2022 Initial Diagnosis   Malignant neoplasm of central portion of left breast in female, estrogen receptor positive (HCC)   08/12/2022 Cancer Staging   Staging form: Breast, AJCC 8th Edition - Clinical stage from 08/12/2022: Stage IIA (cT3, cN0, cM0, G2, ER+, PR+, HER2-) - Signed by Murleen Arms, MD on 08/12/2023 Stage prefix: Initial diagnosis Histologic grading system: 3 grade system     History of Present Illness    Darlene Park is a 78 year old female with breast cancer who presents for follow-up regarding her treatment plan.   She is currently on anastrozole  neoadj therapy.  Discussed the use of AI scribe  software for clinical note transcription with the patient, who gave verbal consent to proceed.  History of Present Illness Darlene Park is a 78 year old female with breast cancer who presents for follow-up regarding treatment and management of calcifications.  She is currently on medication for breast cancer, which she has been taking consistently. She has not seen her surgeon since December and plans to make an appointment to discuss her treatment options further.  She has a mammogram and ultrasound scheduled for August 21st and is considering waiting for these results before making a decision about surgery. She understands that a biopsy would not be feasible for all areas of concern due to the scattered nature of the calcifications.  She is otherwise doing well and reports no new symptoms or changes in her condition. She is taking amlodipine  as part of her current medication regimen.  Rest of the pertinent 10 point ROS reviewed and negative  MEDICAL HISTORY:  Past Medical History:  Diagnosis Date   Anemia    Breast cancer (HCC)    CHICKENPOX, HX OF 11/14/2008   Qualifier: Diagnosis of  By: Georganne Kind RMA, Lucy     GERD    PT. DENIES.03/17/19   HYPERTENSION    Malignant neoplasm of cecum (HCC) 03/28/2009   resection; No chemo/adj tx per onc   Osteoarthritis of left knee    URINARY INCONTINENCE    VITAMIN D  DEFICIENCY     SURGICAL HISTORY: Past Surgical History:  Procedure Laterality Date   ABDOMINAL HYSTERECTOMY  1976   Partial precancer cells done @ [facility] in [place]   COLON SURGERY  12/2008   COLONOSCOPY  HEMORRHOID SURGERY  1980's   POLYPECTOMY      SOCIAL HISTORY: Social History   Socioeconomic History   Marital status: Divorced    Spouse name: Not on file   Number of children: Not on file   Years of education: Not on file   Highest education level: Not on file  Occupational History   Not on file  Tobacco Use   Smoking status: Never    Smokeless tobacco: Never  Vaping Use   Vaping status: Never Used  Substance and Sexual Activity   Alcohol use: Yes    Alcohol/week: 0.0 standard drinks of alcohol    Comment: socially   Drug use: No   Sexual activity: Not on file  Other Topics Concern   Not on file  Social History Narrative   Retired from [employer] with daughter at her home   Social Drivers of Health   Financial Resource Strain: Low Risk  (07/15/2023)   Overall Financial Resource Strain (CARDIA)    Difficulty of Paying Living Expenses: Not hard at all  Food Insecurity: No Food Insecurity (07/15/2023)   Hunger Vital Sign    Worried About Running Out of Food in the Last Year: Never true    Ran Out of Food in the Last Year: Never true  Transportation Needs: No Transportation Needs (07/15/2023)   PRAPARE - Administrator, Civil Service (Medical): No    Lack of Transportation (Non-Medical): No  Physical Activity: Sufficiently Active (07/15/2023)   Exercise Vital Sign    Days of Exercise per Week: 5 days    Minutes of Exercise per Session: 30 min  Stress: No Stress Concern Present (07/15/2023)   [employer] of Occupational Health - Occupational Stress Questionnaire    Feeling of Stress : Not at all  Social Connections: Moderately Isolated (07/15/2023)   Social Connection and Isolation Panel    Frequency of Communication with Friends and Family: More than three times a week    Frequency of Social Gatherings with Friends and Family: More than three times a week    Attends Religious Services: More than 4 times per year    Active Member of [employer] or Organizations: No    Attends Banker Meetings: Never    Marital Status: Divorced  Catering manager Violence: Not At Risk (07/15/2023)   Humiliation, Afraid, Rape, and Kick questionnaire    Fear of Current or Ex-Partner: No    Emotionally Abused: No    Physically Abused: No    Sexually Abused: No    FAMILY  HISTORY: Family History  Adopted: Yes  Problem Relation Age of Onset   Hypertension Other    Heart disease Other    Prostate cancer Other    Colon cancer Neg Hx    Esophageal cancer Neg Hx    Rectal cancer Neg Hx    Stomach cancer Neg Hx    Colon polyps Neg Hx     ALLERGIES:  is allergic to penicillins and crestor  [rosuvastatin ].  MEDICATIONS:  Current Outpatient Medications  Medication Sig Dispense Refill   Acetaminophen (TYLENOL ARTHRITIS PAIN PO) Take 1 tablet by mouth.     amLODipine  (NORVASC ) 5 MG tablet TAKE 1 TABLET (5 MG TOTAL) BY MOUTH DAILY. 90 tablet 3   anastrozole  (ARIMIDEX ) 1 MG tablet TAKE 1 TABLET BY MOUTH EVERY DAY 90 tablet 3   BENICAR  20 MG tablet TAKE 1 TABLET BY MOUTH EVERY DAY 90 tablet 3   Cholecalciferol  1000 UNIT capsule Take 1,000 Units by mouth daily.     diclofenac  sodium (VOLTAREN ) 1 % GEL Apply 2 g topically 3 (three) times daily as needed. 100 g 1   pravastatin  (PRAVACHOL ) 10 MG tablet TAKE 1 TABLET BY MOUTH EVERY DAY (Patient taking differently: 3 (three) times a week. Mon wed and fri) 90 tablet 1   No current facility-administered medications for this visit.    REVIEW OF SYSTEMS:   Constitutional: Denies fevers, chills or abnormal night sweats Eyes: Denies blurriness of vision, double vision or watery eyes Ears, nose, mouth, throat, and face: Denies mucositis or sore throat Respiratory: Denies cough, dyspnea or wheezes Cardiovascular: Denies palpitation, chest discomfort or lower extremity swelling Gastrointestinal:  Denies nausea, heartburn or change in bowel habits Skin: Denies abnormal skin rashes Lymphatics: Denies new lymphadenopathy or easy bruising Neurological:Denies numbness, tingling or new weaknesses Behavioral/Psych: Mood is stable, no new changes  Breast: As mentioned above All other systems were reviewed with the patient and are negative.  PHYSICAL EXAMINATION: ECOG PERFORMANCE STATUS: 0 - Asymptomatic  Vitals:   12/06/23  0834 12/06/23 0835  BP: (!) 148/60 (!) 148/60  Pulse: 81   Resp: 17   Temp: 98.6 F (37 C)   SpO2: 100%     Filed Weights   12/06/23 0834  Weight: 159 lb 14.4 oz (72.5 kg)   General: Alert, oriented and in no acute distress Breasts: Left breast mass in the upper outer quadrant,once again continues to respond but still visible on inspection and palpation. NO regional adenopathy  LABORATORY DATA:  I have reviewed the data as listed Lab Results  Component Value Date   WBC 6.6 07/19/2023   HGB 12.8 07/19/2023   HCT 38.5 07/19/2023   MCV 90.3 07/19/2023   PLT 318.0 07/19/2023   Lab Results  Component Value Date   NA 138 07/19/2023   K 3.7 07/19/2023   CL 101 07/19/2023   CO2 26 07/19/2023    RADIOGRAPHIC STUDIES: I have personally reviewed the radiological reports and agreed with the findings in the report.  ASSESSMENT AND PLAN:  Malignant neoplasm of central portion of left breast in female, estrogen receptor positive (HCC) This is a very pleasant 78 year old postmenopausal female patient with newly diagnosed left breast grade 2 IDC, ER/PR strongly positive HER2 negative, low proliferation index referred to breast MDC for additional recommendations.  Left breast cancer. Tumor has decreased significantly, with neoadj anastrozole  Discussed the possibility of needing a mastectomy due to the extent of calcifications.  She will schedule a FU with Dr Delane Fear She will have another mammogram and US  in August She is leaning towards waiting until this happens before surgery. RTC in 6 months or sooner as needed.  Murleen Arms MD     All questions were answered. The patient knows to call the clinic with any problems, questions or concerns.    Murleen Arms, MD 12/06/23

## 2023-12-06 NOTE — Assessment & Plan Note (Addendum)
 This is a very pleasant 78 year old postmenopausal female patient with newly diagnosed left breast grade 2 IDC, ER/PR strongly positive HER2 negative, low proliferation index referred to breast MDC for additional recommendations.  Left breast cancer. Tumor has decreased significantly, with neoadj anastrozole  Discussed the possibility of needing a mastectomy due to the extent of calcifications.  She will schedule a FU with Dr Delane Fear She will have another mammogram and US  in August She is leaning towards waiting until this happens before surgery. RTC in 6 months or sooner as needed.  Murleen Arms MD

## 2024-01-28 ENCOUNTER — Other Ambulatory Visit: Payer: Self-pay | Admitting: Internal Medicine

## 2024-02-10 ENCOUNTER — Ambulatory Visit: Admission: RE | Admit: 2024-02-10 | Source: Ambulatory Visit

## 2024-02-10 ENCOUNTER — Ambulatory Visit
Admission: RE | Admit: 2024-02-10 | Discharge: 2024-02-10 | Disposition: A | Discharge status: Home / Self Care | Source: Ambulatory Visit | Attending: Hematology and Oncology | Admitting: Hematology and Oncology

## 2024-02-10 DIAGNOSIS — Z170 Estrogen receptor positive status [ER+]: Secondary | ICD-10-CM

## 2024-02-10 DIAGNOSIS — R920 Mammographic microcalcification found on diagnostic imaging of breast: Secondary | ICD-10-CM | POA: Diagnosis not present

## 2024-02-11 ENCOUNTER — Encounter: Payer: Self-pay | Admitting: *Deleted

## 2024-02-15 ENCOUNTER — Encounter: Payer: Self-pay | Admitting: *Deleted

## 2024-03-13 DIAGNOSIS — Z170 Estrogen receptor positive status [ER+]: Secondary | ICD-10-CM | POA: Diagnosis not present

## 2024-03-13 DIAGNOSIS — C50112 Malignant neoplasm of central portion of left female breast: Secondary | ICD-10-CM | POA: Diagnosis not present

## 2024-03-14 ENCOUNTER — Encounter: Payer: Self-pay | Admitting: *Deleted

## 2024-03-29 ENCOUNTER — Encounter: Payer: Self-pay | Admitting: *Deleted

## 2024-03-29 ENCOUNTER — Other Ambulatory Visit: Payer: Self-pay | Admitting: Internal Medicine

## 2024-03-29 DIAGNOSIS — I10 Essential (primary) hypertension: Secondary | ICD-10-CM

## 2024-04-03 ENCOUNTER — Encounter: Payer: Self-pay | Admitting: *Deleted

## 2024-04-04 ENCOUNTER — Telehealth: Payer: Self-pay | Admitting: Hematology and Oncology

## 2024-04-04 NOTE — Telephone Encounter (Signed)
 left vm for pt about resheduled appt date and time. Encouraged to call back if need to change

## 2024-05-05 ENCOUNTER — Other Ambulatory Visit: Payer: Self-pay | Admitting: Internal Medicine

## 2024-05-05 DIAGNOSIS — I10 Essential (primary) hypertension: Secondary | ICD-10-CM

## 2024-05-09 ENCOUNTER — Other Ambulatory Visit: Payer: Self-pay

## 2024-05-09 ENCOUNTER — Telehealth: Payer: Self-pay

## 2024-05-09 DIAGNOSIS — I10 Essential (primary) hypertension: Secondary | ICD-10-CM

## 2024-05-09 MED ORDER — BENICAR 20 MG PO TABS: 20.0000 mg | ORAL_TABLET | Freq: Every day | ORAL | 0 refills | Status: DC

## 2024-05-09 NOTE — Telephone Encounter (Signed)
 Copied from CRM [id number]. Topic: Clinical - Prescription Issue >> May 09, 2024  9:51 AM Aleatha BROCKS wrote: Reason for CRM: [facility] that the  BENICAR  20 MG tablet needs Dr burns approval before it can be filled >> May 09, 2024  2:11 PM Macario HERO wrote: Patient called prescription status; advised sent to pharmacy. >> May 09, 2024  2:00 PM Mia F wrote: Pt daugther called to check the status of the medication. She says every time she goes to the pharmacy shes told that the medicaion is under FDA review and they do not have it. Then they told her that she need to [facility] to have it filled. She is constantly being told that they do not keep it in stock. She says [facility] since [facility] been [facility] today but she says her mother has been taking this medication for soem time and this is the fist time she has ran into these issues. PT has now been without the medication for 3 days.

## 2024-05-09 NOTE — Telephone Encounter (Signed)
 Refill sent today.

## 2024-05-09 NOTE — Telephone Encounter (Signed)
 Copied from CRM [id number]. Topic: Clinical - Prescription Issue >> May 09, 2024  9:51 AM Aleatha BROCKS wrote: Reason for CRM: [facility] the  BENICAR  20 MG tablet needs Dr burns approval before it can be filled

## 2024-05-09 NOTE — Telephone Encounter (Signed)
 Pt daughter Marko called in to provide an update to this ongoing issue. She says [facility] and they assured her they do not have this medication in stock and the surrounding [facility] do not either, they told [facility] to go [facility] and try there. Marko states that she spoke to her pharmacist Sam, at [facility] and he is willing to order the Benicar  20 MG Tablets as a one time fill. They do not want to switch the full prescription because pt will go back to using a mail order pharmacy after this fill so she can avoid the hassle. Request is for 30 day supply only to [facility] at [address] [place], [place] [address] Phone: [phone] Hours: 9am-6pm  Please call pt to inform her of status of request at [phone], thank you

## 2024-05-10 ENCOUNTER — Other Ambulatory Visit: Payer: Self-pay

## 2024-05-10 ENCOUNTER — Telehealth: Payer: Self-pay

## 2024-05-10 DIAGNOSIS — I10 Essential (primary) hypertension: Secondary | ICD-10-CM

## 2024-05-10 MED ORDER — BENICAR 20 MG PO TABS
20.0000 mg | ORAL_TABLET | Freq: Every day | ORAL | 0 refills | Status: AC
Start: 2024-05-10 — End: ?

## 2024-05-10 NOTE — Telephone Encounter (Unsigned)
 Copied from CRM [id number]. Topic: Clinical - Prescription Issue >> May 10, 2024  9:48 AM Mesmerise C wrote: Reason for CRM: [facility] no [facility] in 50 [place] radius has BENICAR  20 MG tablet in stock has the generic in stock but patient states can't take generic, patient's daughter keeps coming everyday to trying pick up the medication but advised it's on backorder >> May 10, 2024 10:28 AM Carlyon D wrote: Pt is calling back in regards to  medication BENICAR  20 MG Tablet pt  states pharmacy is going to transfer medication  to another pharmacy that has it in stock if pt has any other issues she will call back.

## 2024-05-10 NOTE — Telephone Encounter (Signed)
 Copied from CRM [id number]. Topic: Clinical - Prescription Issue >> May 10, 2024  9:48 AM Mesmerise C wrote: Reason for CRM: [facility] no [facility] in 50 mile radius has BENICAR  20 MG tablet in stock has the generic in stock but patient states can't take generic, patient's daughter keeps coming everyday to trying pick up the medication but advised it's on backorder

## 2024-05-10 NOTE — Telephone Encounter (Signed)
 Patient informed today that script has been sent.

## 2024-05-10 NOTE — Telephone Encounter (Signed)
 Message already addressed.  Script sent to [facility] today

## 2024-05-10 NOTE — Telephone Encounter (Signed)
 Copied from CRM [id number]. Topic: Clinical - Prescription Issue >> May 10, 2024  9:47 AM Darlene Park wrote: Reason for CRM: Patient called back in regards to Benicar . [facility] but they did not fill Benicar  20 mg because they said that it was out of stock.  Patient is asking if she can get the generic brand or is there another brand that Dr. Geofm would want to put the patient on.   Patient has been without her medicine for a couple days now.    [phone] (H)

## 2024-06-01 ENCOUNTER — Ambulatory Visit (HOSPITAL_COMMUNITY): Admit: 2024-06-01 | Admitting: General Surgery

## 2024-06-01 SURGERY — MASTECTOMY WITH SENTINEL LYMPH NODE BIOPSY
Anesthesia: General | Site: Breast | Laterality: Left

## 2024-06-03 ENCOUNTER — Other Ambulatory Visit: Payer: Self-pay | Admitting: Internal Medicine

## 2024-06-03 DIAGNOSIS — I10 Essential (primary) hypertension: Secondary | ICD-10-CM

## 2024-06-06 ENCOUNTER — Ambulatory Visit: Admitting: Hematology and Oncology

## 2024-06-07 ENCOUNTER — Encounter: Payer: Self-pay | Admitting: *Deleted

## 2024-06-12 ENCOUNTER — Inpatient Hospital Stay: Attending: Adult Health | Admitting: Adult Health

## 2024-06-12 ENCOUNTER — Encounter: Payer: Self-pay | Admitting: Adult Health

## 2024-06-12 VITALS — BP 149/59 | HR 89 | Temp 98.2°F | Resp 16 | Ht 61.0 in | Wt 155.6 lb

## 2024-06-12 DIAGNOSIS — Z170 Estrogen receptor positive status [ER+]: Secondary | ICD-10-CM | POA: Diagnosis not present

## 2024-06-12 DIAGNOSIS — Z1721 Progesterone receptor positive status: Secondary | ICD-10-CM | POA: Insufficient documentation

## 2024-06-12 DIAGNOSIS — Z1732 Human epidermal growth factor receptor 2 negative status: Secondary | ICD-10-CM | POA: Insufficient documentation

## 2024-06-12 DIAGNOSIS — Z79811 Long term (current) use of aromatase inhibitors: Secondary | ICD-10-CM | POA: Diagnosis not present

## 2024-06-12 DIAGNOSIS — C50112 Malignant neoplasm of central portion of left female breast: Secondary | ICD-10-CM | POA: Insufficient documentation

## 2024-06-12 NOTE — Progress Notes (Signed)
 [facility] Cancer Follow up:    Geofm Glade PARAS, MD [address] [place] [address]   DIAGNOSIS: Cancer Staging  Malignant neoplasm of central portion of left breast in female, estrogen receptor positive (HCC) Staging form: Breast, AJCC 8th Edition - Clinical stage from 08/12/2022: Stage IIA (cT3, cN0, cM0, G2, ER+, PR+, HER2-) - Signed by Loretha Ash, MD on 08/12/2023 Stage prefix: Initial diagnosis Histologic grading system: 3 grade system    SUMMARY OF ONCOLOGIC HISTORY: Oncology History  Malignant neoplasm of central portion of left breast in female, estrogen receptor positive (HCC)  07/29/2022 Mammogram   Bilateral diagnostic mammogram showed 7.7 x 5.8 x 6.8 cm oval high density mass in the left breast suspicious of malignancy.  Ultrasound-guided biopsy was recommended   08/05/2022 Pathology Results   Left breast needle core biopsy showed grade 2 IDC, ER positive strong staining 95%, PR 100% pos strong staining intensity, Ki 2%, Her 2 0   08/10/2022 Initial Diagnosis   Malignant neoplasm of central portion of left breast in female, estrogen receptor positive (HCC)   08/12/2022 Cancer Staging   Staging form: Breast, AJCC 8th Edition - Clinical stage from 08/12/2022: Stage IIA (cT3, cN0, cM0, G2, ER+, PR+, HER2-) - Signed by Loretha Ash, MD on 08/12/2023 Stage prefix: Initial diagnosis Histologic grading system: 3 grade system     CURRENT THERAPY: anastrozole   INTERVAL HISTORY:  Discussed the use of AI scribe software for clinical note transcription with the patient, who gave verbal consent to proceed.  History of Present Illness Darlene Park is an 78 year old female with stage IIA ER/PR-positive invasive ductal carcinoma of the left breast who presents for oncology follow-up to assess response to anastrozole .  She has a central left breast mass initially measuring 7 cm, biopsy-proven grade 2 invasive ductal carcinoma, ER/PR positive,  without axillary nodal involvement on ultrasound. She has declined mastectomy and has not had surgery.  She takes anastrozole  1 mg daily. Recent imaging shows decreased tumor size, which she also perceives clinically as now only a few centimeters. She has no new breast or systemic symptoms but is closely monitoring the mass.  She notes she canceled surgery with her daughter being sick and wanting to wait until she was better before undergoing her surgery.  She also reports desiring and hoping lumpectomy might be feasible instead of mastectomy.       Patient Active Problem List   Diagnosis Date Noted   Malignant neoplasm of central portion of left breast in female, estrogen receptor positive (HCC) 08/10/2022   Osteopenia 08/05/2022   Hyperlipidemia 07/15/2021   Bilateral leg edema 05/13/2020   Chronic kidney disease (CKD), stage III (moderate) (HCC) 02/18/2019   Neck pain 01/19/2018   Osteoarthritis of left knee    Obese 09/25/2010   History of colon cancer, cecum 03/28/2009   Vitamin D  deficiency 11/29/2008   Essential hypertension 11/14/2008    is allergic to penicillins and crestor  [rosuvastatin ].  MEDICAL HISTORY: Past Medical History:  Diagnosis Date   Anemia    Breast cancer (HCC)    CHICKENPOX, HX OF 11/14/2008   Qualifier: Diagnosis of  By: Wilhemina RMA, Lucy     GERD    PT. DENIES.03/17/19   HYPERTENSION    Malignant neoplasm of cecum (HCC) 03/28/2009   resection; No chemo/adj tx per onc   Osteoarthritis of left knee    URINARY INCONTINENCE    VITAMIN D  DEFICIENCY     SURGICAL HISTORY: Past Surgical History:  Procedure Laterality Date   ABDOMINAL HYSTERECTOMY  1976   Partial precancer cells done @ [facility] in [place]   COLON SURGERY  12/2008   COLONOSCOPY     HEMORRHOID SURGERY  1980's   POLYPECTOMY      SOCIAL HISTORY: Social History   Socioeconomic History   Marital status: Divorced    Spouse name: Not on file   Number of children: Not on file    Years of education: Not on file   Highest education level: Not on file  Occupational History   Not on file  Tobacco Use   Smoking status: Never   Smokeless tobacco: Never  Vaping Use   Vaping status: Never Used  Substance and Sexual Activity   Alcohol use: Yes    Alcohol/week: 0.0 standard drinks of alcohol    Comment: socially   Drug use: No   Sexual activity: Not on file  Other Topics Concern   Not on file  Social History Narrative   Retired from [employer] with daughter at her home   Social Drivers of Health   Tobacco Use: Low Risk (06/12/2024)   Patient History    Smoking Tobacco Use: Never    Smokeless Tobacco Use: Never    Passive Exposure: Not on file  Financial Resource Strain: Low Risk (06/12/2024)   Overall Financial Resource Strain (CARDIA)    Difficulty of Paying Living Expenses: Not hard at all  Food Insecurity: No Food Insecurity (06/12/2024)   Epic    Worried About Radiation Protection Practitioner of Food in the Last Year: Never true    Ran Out of Food in the Last Year: Never true  Transportation Needs: No Transportation Needs (06/12/2024)   Epic    Lack of Transportation (Medical): No    Lack of Transportation (Non-Medical): No  Physical Activity: Insufficiently Active (06/12/2024)   Exercise Vital Sign    Days of Exercise per Week: 3 days    Minutes of Exercise per Session: 30 min  Stress: No Stress Concern Present (06/12/2024)   [employer] of Occupational Health - Occupational Stress Questionnaire    Feeling of Stress: Not at all  Social Connections: Moderately Isolated (06/12/2024)   Social Connection and Isolation Panel    Frequency of Communication with Friends and Family: More than three times a week    Frequency of Social Gatherings with Friends and Family: Three times a week    Attends Religious Services: 1 to 4 times per year    Active Member of Clubs or Organizations: No    Attends Banker Meetings: Never    Marital  Status: Divorced  Catering Manager Violence: Not At Risk (06/12/2024)   Epic    Fear of Current or Ex-Partner: No    Emotionally Abused: No    Physically Abused: No    Sexually Abused: No  Depression (PHQ2-9): Low Risk (06/12/2024)   Depression (PHQ2-9)    PHQ-2 Score: 1  Alcohol Screen: Low Risk (06/12/2024)   Alcohol Screen    Last Alcohol Screening Score (AUDIT): 1  Housing: Unknown (06/12/2024)   Epic    Unable to Pay for Housing in the Last Year: No    Number of Times Moved in the Last Year: Not on file    Homeless in the Last Year: No  Utilities: Not At Risk (06/12/2024)   Epic    Threatened with loss of utilities: No  Health Literacy: Adequate Health Literacy (06/12/2024)   B1300  Health Literacy    Frequency of need for help with medical instructions: Never    FAMILY HISTORY: Family History  Adopted: Yes  Problem Relation Age of Onset   Hypertension Other    Heart disease Other    Prostate cancer Other    Colon cancer Neg Hx    Esophageal cancer Neg Hx    Rectal cancer Neg Hx    Stomach cancer Neg Hx    Colon polyps Neg Hx     Review of Systems  Constitutional:  Negative for appetite change, chills, fatigue, fever and unexpected weight change.  HENT:   Negative for hearing loss, lump/mass and trouble swallowing.   Eyes:  Negative for eye problems and icterus.  Respiratory:  Negative for chest tightness, cough and shortness of breath.   Cardiovascular:  Negative for chest pain, leg swelling and palpitations.  Gastrointestinal:  Negative for abdominal distention, abdominal pain, constipation, diarrhea, nausea and vomiting.  Endocrine: Negative for hot flashes.  Genitourinary:  Negative for difficulty urinating.   Musculoskeletal:  Negative for arthralgias.  Skin:  Negative for itching and rash.  Neurological:  Negative for dizziness, extremity weakness, headaches and numbness.  Hematological:  Negative for adenopathy. Does not bruise/bleed easily.   Psychiatric/Behavioral:  Negative for depression. The patient is not nervous/anxious.       PHYSICAL EXAMINATION    Vitals:   06/12/24 1000  BP: (!) 149/59  Pulse: 89  Resp: 16  Temp: 98.2 F (36.8 C)  SpO2: 100%    Physical Exam Constitutional:      General: She is not in acute distress.    Appearance: Normal appearance. She is not toxic-appearing.  HENT:     Head: Normocephalic and atraumatic.     Mouth/Throat:     Mouth: Mucous membranes are moist.     Pharynx: Oropharynx is clear. No oropharyngeal exudate or posterior oropharyngeal erythema.  Eyes:     General: No scleral icterus. Cardiovascular:     Rate and Rhythm: Normal rate and regular rhythm.     Pulses: Normal pulses.     Heart sounds: Normal heart sounds.  Pulmonary:     Effort: Pulmonary effort is normal.     Breath sounds: Normal breath sounds.  Abdominal:     General: Abdomen is flat. Bowel sounds are normal. There is no distension.     Palpations: Abdomen is soft.     Tenderness: There is no abdominal tenderness.  Musculoskeletal:        General: No swelling.     Cervical back: Neck supple.  Lymphadenopathy:     Cervical: No cervical adenopathy.  Skin:    General: Skin is warm and dry.     Findings: No rash.  Neurological:     General: No focal deficit present.     Mental Status: She is alert.  Psychiatric:        Mood and Affect: Mood normal.        Behavior: Behavior normal.      ASSESSMENT and THERAPY PLAN:     Assessment and Plan Assessment & Plan Stage IIa estrogen receptor positive invasive ductal carcinoma of the left breast 7 cm mass, responding to anastrozole . Risk of progression, mutation, or local complications remains. Surgery deferred, patient desiring lumpectomy. Discussed the possibility that anastrozole  could stop working in the future - Continued anastrozole  1 mg daily. - Ordered repeat mammogram and breast ultrasound for February. - Contacted surgical oncology  (Dr. Ebbie) for follow-up post-imaging. - Scheduled  oncology follow-up in March with Dr. Stacia. - Advised to seek assessment for any increase in mass size or concerning symptoms. - Discussed potential lumpectomy or mastectomy based on future imaging to prevent local complications.   All questions were answered. The patient knows to call the clinic with any problems, questions or concerns. We can certainly see the patient much sooner if necessary.  Total encounter time:30 minutes*in face-to-face visit time, chart review, lab review, care coordination, order entry, and documentation of the encounter time.    Morna Kendall, NP 06/12/2024 10:07 AM Medical Oncology and Hematology [facility] [address] [place], [place] [address] Tel. [phone]    Fax. [phone]  *Total Encounter Time as defined by the Centers for Medicare and Medicaid Services includes, in addition to the face-to-face time of a patient visit (documented in the note above) non-face-to-face time: obtaining and reviewing outside history, ordering and reviewing medications, tests or procedures, care coordination (communications with other health care professionals or caregivers) and documentation in the medical record.

## 2024-06-23 ENCOUNTER — Telehealth: Payer: Self-pay

## 2024-06-23 DIAGNOSIS — Z170 Estrogen receptor positive status [ER+]: Secondary | ICD-10-CM

## 2024-06-23 NOTE — Telephone Encounter (Signed)
 Copied from CRM [id number]. Topic: Referral - Request for Referral >> Jun 23, 2024  9:04 AM Chasity T wrote: Did the patient discuss referral with their provider in the last year? Yes   Appointment offered? Yes  Type of order/referral and detailed reason for visit: cancer patient; patient has been seen at the [facility] this morning and was advised per front that that all medicare complete patients has to have an referral from PCP to be seen. They are allowing her to be seen today but office is requesting a referral to be sent over for future appointments.  Preference of office, provider, location:  [facility] - [place], [facility]- [place], [place]  If referral order, have you been seen by this specialty before? Yes (If Yes, this issue or another issue? When? Where?  Can we respond through MyChart? Yes

## 2024-06-23 NOTE — Telephone Encounter (Signed)
 Referral ordered

## 2024-06-28 ENCOUNTER — Telehealth: Payer: Self-pay

## 2024-06-28 NOTE — Telephone Encounter (Signed)
 Copied from CRM #[id number]. Topic: General - Other >> Jun 28, 2024  2:10 PM Berneda FALCON wrote: Reason for CRM: Samule from [facility] is calling in asking if she could speak to someone to help her with putting in a referral to [facility] website for mutual patient.Called [facility] to see if there was someone there who could help her with this. Spoke to Blanco who let me know that referral coordinators are actually off site, and to send a message so we can have them reach out to her.  Please contact her at [phone] main line, ask for Sabrina please.

## 2024-06-29 ENCOUNTER — Encounter: Payer: Self-pay | Admitting: *Deleted

## 2024-07-13 ENCOUNTER — Other Ambulatory Visit: Payer: Self-pay | Admitting: Internal Medicine

## 2024-07-13 DIAGNOSIS — I10 Essential (primary) hypertension: Secondary | ICD-10-CM

## 2024-07-14 ENCOUNTER — Telehealth: Payer: Self-pay

## 2024-07-14 NOTE — Telephone Encounter (Signed)
 Spoke with patient and appointment made

## 2024-07-14 NOTE — Telephone Encounter (Signed)
 Copied from CRM #[id number]. Topic: Appointments - Appointment Scheduling >> Jul 14, 2024  8:27 AM Berneda FALCON wrote: Patient was under the impression that her AWV was a physical in person with PCP and wanted to schedule an in-person physical with her PCP. I explained the difference between her AWV and physical and offered to make her a physical appt with PCP but the first available for Dr. Geofm was in May and she wants to be seen sooner than May. She has breast cancer and does not want to wait this long for her appt.  She would like to know if there is anywhere else she can get worked in so that she can have her in person physical with PCP sooner. Last physical was 07/19/23.   Please call patient back at [phone] with any updates

## 2024-07-17 ENCOUNTER — Ambulatory Visit: Payer: Medicare Other

## 2024-07-24 ENCOUNTER — Other Ambulatory Visit: Payer: Self-pay | Admitting: Internal Medicine

## 2024-08-02 ENCOUNTER — Other Ambulatory Visit

## 2024-08-02 ENCOUNTER — Encounter

## 2024-08-09 ENCOUNTER — Encounter: Admitting: Internal Medicine

## 2024-09-08 ENCOUNTER — Inpatient Hospital Stay: Admitting: Hematology and Oncology

## 2025-01-03 ENCOUNTER — Ambulatory Visit
